# Patient Record
Sex: Female | Born: 1935 | Race: White | Hispanic: No | Marital: Married | Smoking: Never smoker
Health system: Southern US, Community
[De-identification: ages and names within clinical notes are randomized; demographics above are authoritative.]

## PROBLEM LIST (undated history)

## (undated) DIAGNOSIS — T7840XA Allergy, unspecified, initial encounter: Secondary | ICD-10-CM

## (undated) DIAGNOSIS — B029 Zoster without complications: Secondary | ICD-10-CM

## (undated) DIAGNOSIS — Z8719 Personal history of other diseases of the digestive system: Secondary | ICD-10-CM

## (undated) DIAGNOSIS — K529 Noninfective gastroenteritis and colitis, unspecified: Secondary | ICD-10-CM

## (undated) DIAGNOSIS — M545 Low back pain: Secondary | ICD-10-CM

## (undated) DIAGNOSIS — I10 Essential (primary) hypertension: Secondary | ICD-10-CM

## (undated) DIAGNOSIS — M25551 Pain in right hip: Secondary | ICD-10-CM

## (undated) DIAGNOSIS — R252 Cramp and spasm: Secondary | ICD-10-CM

## (undated) DIAGNOSIS — I2510 Atherosclerotic heart disease of native coronary artery without angina pectoris: Secondary | ICD-10-CM

## (undated) DIAGNOSIS — H5410 Blindness, one eye, low vision other eye, unspecified eyes: Secondary | ICD-10-CM

## (undated) HISTORY — DX: Personal history of other diseases of the digestive system: Z87.19

## (undated) HISTORY — DX: Allergy, unspecified, initial encounter: T78.40XA

## (undated) HISTORY — DX: Low back pain: M54.5

## (undated) HISTORY — PX: LAMINOTOMY: SHX998

## (undated) HISTORY — DX: Essential (primary) hypertension: I10

## (undated) HISTORY — DX: Cramp and spasm: R25.2

## (undated) HISTORY — PX: TONSILLECTOMY: SHX5217

## (undated) HISTORY — PX: APPENDECTOMY: SHX54

## (undated) HISTORY — PX: TUBAL LIGATION: SHX77

## (undated) HISTORY — DX: Pain in right hip: M25.551

## (undated) HISTORY — DX: Zoster without complications: B02.9

## (undated) HISTORY — DX: Atherosclerotic heart disease of native coronary artery without angina pectoris: I25.10

## (undated) HISTORY — PX: OTHER SURGICAL HISTORY: SHX169

## (undated) HISTORY — PX: CATARACT EXTRACTION: SUR2

## (undated) HISTORY — PX: EYE SURGERY: SHX253

## (undated) HISTORY — DX: Noninfective gastroenteritis and colitis, unspecified: K52.9

---

## 2000-11-08 ENCOUNTER — Emergency Department (HOSPITAL_COMMUNITY): Admission: EM | Admit: 2000-11-08 | Discharge: 2000-11-08 | Payer: Self-pay | Admitting: Emergency Medicine

## 2005-12-22 ENCOUNTER — Encounter: Payer: Self-pay | Admitting: Internal Medicine

## 2005-12-30 ENCOUNTER — Encounter: Payer: Self-pay | Admitting: Internal Medicine

## 2007-07-06 ENCOUNTER — Inpatient Hospital Stay (HOSPITAL_COMMUNITY): Admission: RE | Admit: 2007-07-06 | Discharge: 2007-07-07 | Payer: Self-pay | Admitting: Neurosurgery

## 2007-10-13 ENCOUNTER — Encounter: Admission: RE | Admit: 2007-10-13 | Discharge: 2007-10-13 | Payer: Self-pay | Admitting: Neurosurgery

## 2008-08-18 HISTORY — PX: CORONARY ANGIOPLASTY: SHX604

## 2009-08-24 ENCOUNTER — Encounter: Payer: Self-pay | Admitting: Internal Medicine

## 2009-08-24 ENCOUNTER — Ambulatory Visit: Payer: Self-pay | Admitting: Diagnostic Radiology

## 2009-08-24 ENCOUNTER — Emergency Department (HOSPITAL_BASED_OUTPATIENT_CLINIC_OR_DEPARTMENT_OTHER): Admission: EM | Admit: 2009-08-24 | Discharge: 2009-08-24 | Payer: Self-pay | Admitting: Emergency Medicine

## 2009-08-27 ENCOUNTER — Encounter: Payer: Self-pay | Admitting: Internal Medicine

## 2009-09-04 ENCOUNTER — Encounter: Payer: Self-pay | Admitting: Internal Medicine

## 2009-09-20 ENCOUNTER — Ambulatory Visit: Payer: Self-pay | Admitting: Internal Medicine

## 2009-09-20 DIAGNOSIS — I10 Essential (primary) hypertension: Secondary | ICD-10-CM

## 2009-09-20 DIAGNOSIS — R112 Nausea with vomiting, unspecified: Secondary | ICD-10-CM

## 2009-09-20 DIAGNOSIS — E1169 Type 2 diabetes mellitus with other specified complication: Secondary | ICD-10-CM | POA: Insufficient documentation

## 2009-09-20 DIAGNOSIS — R319 Hematuria, unspecified: Secondary | ICD-10-CM

## 2009-09-20 DIAGNOSIS — J309 Allergic rhinitis, unspecified: Secondary | ICD-10-CM | POA: Insufficient documentation

## 2009-09-20 DIAGNOSIS — I252 Old myocardial infarction: Secondary | ICD-10-CM

## 2009-09-20 DIAGNOSIS — E119 Type 2 diabetes mellitus without complications: Secondary | ICD-10-CM

## 2009-09-20 DIAGNOSIS — E669 Obesity, unspecified: Secondary | ICD-10-CM | POA: Insufficient documentation

## 2009-09-20 LAB — CONVERTED CEMR LAB
ALT: 38 units/L — ABNORMAL HIGH (ref 0–35)
AST: 30 units/L (ref 0–37)
Albumin: 3.9 g/dL (ref 3.5–5.2)
Alkaline Phosphatase: 68 units/L (ref 39–117)
BUN: 16 mg/dL (ref 6–23)
Bilirubin Urine: NEGATIVE
Bilirubin, Direct: 0.1 mg/dL (ref 0.0–0.3)
CO2: 26 meq/L (ref 19–32)
Calcium: 8.4 mg/dL (ref 8.4–10.5)
Casts: NONE SEEN /lpf
Chloride: 107 meq/L (ref 96–112)
Creatinine, Ser: 0.92 mg/dL (ref 0.40–1.20)
Creatinine, Urine: 54.8 mg/dL
Crystals: NONE SEEN
Glucose, Bld: 114 mg/dL — ABNORMAL HIGH (ref 70–99)
Hgb A1c MFr Bld: 8.3 % — ABNORMAL HIGH (ref 4.6–6.1)
Indirect Bilirubin: 0.4 mg/dL (ref 0.0–0.9)
Ketones, ur: NEGATIVE mg/dL
Leukocytes, UA: NEGATIVE
Microalb Creat Ratio: 125.4 mg/g — ABNORMAL HIGH (ref 0.0–30.0)
Microalb, Ur: 6.87 mg/dL — ABNORMAL HIGH (ref 0.00–1.89)
Nitrite: POSITIVE — AB
Potassium: 4.4 meq/L (ref 3.5–5.3)
Protein, ur: NEGATIVE mg/dL
RBC / HPF: NONE SEEN (ref <3)
Sodium: 145 meq/L (ref 135–145)
Specific Gravity, Urine: 1.013 (ref 1.005–1.030)
Squamous Epithelial / HPF: NONE SEEN /lpf
Total Bilirubin: 0.5 mg/dL (ref 0.3–1.2)
Total Protein: 6.4 g/dL (ref 6.0–8.3)
Urine Glucose: NEGATIVE mg/dL
Urobilinogen, UA: 0.2 (ref 0.0–1.0)
WBC, UA: NONE SEEN cells/hpf (ref <3)
pH: 5.5 (ref 5.0–8.0)

## 2009-09-21 ENCOUNTER — Telehealth: Payer: Self-pay | Admitting: Internal Medicine

## 2009-09-21 ENCOUNTER — Ambulatory Visit: Payer: Self-pay | Admitting: Diagnostic Radiology

## 2009-09-21 ENCOUNTER — Ambulatory Visit (HOSPITAL_BASED_OUTPATIENT_CLINIC_OR_DEPARTMENT_OTHER)
Admission: RE | Admit: 2009-09-21 | Discharge: 2009-09-21 | Payer: Self-pay | Source: Home / Self Care | Admitting: Internal Medicine

## 2009-09-24 DIAGNOSIS — I2510 Atherosclerotic heart disease of native coronary artery without angina pectoris: Secondary | ICD-10-CM

## 2009-09-24 DIAGNOSIS — I25118 Atherosclerotic heart disease of native coronary artery with other forms of angina pectoris: Secondary | ICD-10-CM | POA: Insufficient documentation

## 2009-09-25 ENCOUNTER — Encounter: Payer: Self-pay | Admitting: Internal Medicine

## 2009-09-27 ENCOUNTER — Telehealth: Payer: Self-pay | Admitting: Internal Medicine

## 2009-09-28 ENCOUNTER — Telehealth: Payer: Self-pay | Admitting: Internal Medicine

## 2009-10-10 ENCOUNTER — Encounter: Payer: Self-pay | Admitting: Internal Medicine

## 2009-10-10 ENCOUNTER — Encounter: Admission: RE | Admit: 2009-10-10 | Discharge: 2010-01-08 | Payer: Self-pay | Admitting: Internal Medicine

## 2009-10-18 ENCOUNTER — Ambulatory Visit: Payer: Self-pay | Admitting: Internal Medicine

## 2009-11-21 ENCOUNTER — Encounter: Payer: Self-pay | Admitting: Internal Medicine

## 2009-12-13 ENCOUNTER — Ambulatory Visit: Payer: Self-pay | Admitting: Internal Medicine

## 2009-12-13 LAB — CONVERTED CEMR LAB
BUN: 16 mg/dL (ref 6–23)
CO2: 27 meq/L (ref 19–32)
Calcium: 8.8 mg/dL (ref 8.4–10.5)
Chloride: 104 meq/L (ref 96–112)
Creatinine, Ser: 1.15 mg/dL (ref 0.40–1.20)
Glucose, Bld: 150 mg/dL — ABNORMAL HIGH (ref 70–99)
Hgb A1c MFr Bld: 6.4 % — ABNORMAL HIGH (ref <5.7)
Potassium: 5.1 meq/L (ref 3.5–5.3)
Sodium: 139 meq/L (ref 135–145)

## 2009-12-17 ENCOUNTER — Emergency Department (HOSPITAL_BASED_OUTPATIENT_CLINIC_OR_DEPARTMENT_OTHER): Admission: EM | Admit: 2009-12-17 | Discharge: 2009-12-17 | Payer: Self-pay | Admitting: Emergency Medicine

## 2009-12-17 ENCOUNTER — Ambulatory Visit: Payer: Self-pay | Admitting: Internal Medicine

## 2009-12-17 DIAGNOSIS — N390 Urinary tract infection, site not specified: Secondary | ICD-10-CM | POA: Insufficient documentation

## 2009-12-17 LAB — CONVERTED CEMR LAB
Bilirubin Urine: NEGATIVE
Glucose, Urine, Semiquant: NEGATIVE
Ketones, urine, test strip: NEGATIVE
Nitrite: POSITIVE
Protein, U semiquant: 30
Specific Gravity, Urine: 1.02
Urobilinogen, UA: 0.2
WBC Urine, dipstick: NEGATIVE
pH: 5

## 2009-12-18 ENCOUNTER — Telehealth: Payer: Self-pay | Admitting: Internal Medicine

## 2009-12-28 ENCOUNTER — Ambulatory Visit: Payer: Self-pay | Admitting: Internal Medicine

## 2010-02-19 ENCOUNTER — Encounter: Payer: Self-pay | Admitting: Internal Medicine

## 2010-02-20 ENCOUNTER — Encounter: Payer: Self-pay | Admitting: Internal Medicine

## 2010-02-20 LAB — CONVERTED CEMR LAB
BUN: 25 mg/dL — ABNORMAL HIGH (ref 6–23)
CO2: 26 meq/L (ref 19–32)
Calcium: 9.1 mg/dL (ref 8.4–10.5)
Chloride: 106 meq/L (ref 96–112)
Creatinine, Ser: 1.03 mg/dL (ref 0.40–1.20)
Creatinine, Urine: 118.5 mg/dL
Glucose, Bld: 148 mg/dL — ABNORMAL HIGH (ref 70–99)
Hgb A1c MFr Bld: 7.3 % — ABNORMAL HIGH (ref <5.7)
Microalb Creat Ratio: 4.6 mg/g (ref 0.0–30.0)
Microalb, Ur: 0.55 mg/dL (ref 0.00–1.89)
Potassium: 5.1 meq/L (ref 3.5–5.3)
Sodium: 142 meq/L (ref 135–145)

## 2010-02-21 LAB — HM COLONOSCOPY

## 2010-02-28 ENCOUNTER — Ambulatory Visit: Payer: Self-pay | Admitting: Internal Medicine

## 2010-03-21 ENCOUNTER — Encounter: Payer: Self-pay | Admitting: Internal Medicine

## 2010-04-15 ENCOUNTER — Ambulatory Visit: Payer: Self-pay | Admitting: Internal Medicine

## 2010-04-15 DIAGNOSIS — M546 Pain in thoracic spine: Secondary | ICD-10-CM

## 2010-05-16 LAB — CONVERTED CEMR LAB
BUN: 22 mg/dL (ref 6–23)
CO2: 26 meq/L (ref 19–32)
Calcium: 8.8 mg/dL (ref 8.4–10.5)
Chloride: 107 meq/L (ref 96–112)
Cholesterol: 114 mg/dL (ref 0–200)
Creatinine, Ser: 0.99 mg/dL (ref 0.40–1.20)
Glucose, Bld: 163 mg/dL — ABNORMAL HIGH (ref 70–99)
HDL: 44 mg/dL (ref >39)
Hgb A1c MFr Bld: 8.3 % — ABNORMAL HIGH (ref <5.7)
LDL Cholesterol: 46 mg/dL (ref 0–99)
Potassium: 5 meq/L (ref 3.5–5.3)
Sodium: 142 meq/L (ref 135–145)
TSH: 1.969 microintl units/mL (ref 0.350–4.500)
Total CHOL/HDL Ratio: 2.6
Triglycerides: 118 mg/dL (ref <150)
VLDL: 24 mg/dL (ref 0–40)

## 2010-05-23 ENCOUNTER — Ambulatory Visit: Payer: Self-pay | Admitting: Family Medicine

## 2010-05-23 ENCOUNTER — Ambulatory Visit (HOSPITAL_BASED_OUTPATIENT_CLINIC_OR_DEPARTMENT_OTHER): Admission: RE | Admit: 2010-05-23 | Discharge: 2010-05-23 | Payer: Self-pay | Admitting: Family Medicine

## 2010-05-23 ENCOUNTER — Ambulatory Visit: Payer: Self-pay | Admitting: Internal Medicine

## 2010-05-23 ENCOUNTER — Ambulatory Visit: Payer: Self-pay | Admitting: Diagnostic Radiology

## 2010-05-23 DIAGNOSIS — M25519 Pain in unspecified shoulder: Secondary | ICD-10-CM

## 2010-05-23 DIAGNOSIS — L259 Unspecified contact dermatitis, unspecified cause: Secondary | ICD-10-CM | POA: Insufficient documentation

## 2010-05-23 LAB — HM DIABETES FOOT EXAM

## 2010-05-26 ENCOUNTER — Encounter: Admission: RE | Admit: 2010-05-26 | Discharge: 2010-05-26 | Payer: Self-pay | Admitting: Neurosurgery

## 2010-06-19 ENCOUNTER — Ambulatory Visit: Payer: Self-pay | Admitting: Internal Medicine

## 2010-06-19 DIAGNOSIS — L03039 Cellulitis of unspecified toe: Secondary | ICD-10-CM

## 2010-06-19 DIAGNOSIS — M545 Low back pain: Secondary | ICD-10-CM

## 2010-07-01 ENCOUNTER — Encounter
Admission: RE | Admit: 2010-07-01 | Discharge: 2010-08-15 | Payer: Self-pay | Source: Home / Self Care | Attending: Internal Medicine | Admitting: Internal Medicine

## 2010-07-02 ENCOUNTER — Encounter: Payer: Self-pay | Admitting: Internal Medicine

## 2010-07-04 ENCOUNTER — Encounter
Admission: RE | Admit: 2010-07-04 | Discharge: 2010-08-15 | Payer: Self-pay | Source: Home / Self Care | Attending: Family Medicine | Admitting: Family Medicine

## 2010-08-01 ENCOUNTER — Encounter: Payer: Self-pay | Admitting: Internal Medicine

## 2010-08-01 LAB — CONVERTED CEMR LAB
BUN: 20 mg/dL (ref 6–23)
CO2: 24 meq/L (ref 19–32)
Calcium: 9.3 mg/dL (ref 8.4–10.5)
Chloride: 107 meq/L (ref 96–112)
Creatinine, Ser: 1.12 mg/dL (ref 0.40–1.20)
Glucose, Bld: 177 mg/dL — ABNORMAL HIGH (ref 70–99)
Hgb A1c MFr Bld: 8.1 % — ABNORMAL HIGH (ref <5.7)
Potassium: 5.1 meq/L (ref 3.5–5.3)
Sodium: 141 meq/L (ref 135–145)

## 2010-08-08 ENCOUNTER — Ambulatory Visit: Payer: Self-pay | Admitting: Internal Medicine

## 2010-08-15 ENCOUNTER — Encounter: Payer: Self-pay | Admitting: Internal Medicine

## 2010-08-20 ENCOUNTER — Encounter
Admission: RE | Admit: 2010-08-20 | Discharge: 2010-09-14 | Payer: Self-pay | Source: Home / Self Care | Attending: Family Medicine | Admitting: Family Medicine

## 2010-08-22 ENCOUNTER — Encounter: Payer: Self-pay | Admitting: Internal Medicine

## 2010-08-27 ENCOUNTER — Encounter: Admit: 2010-08-27 | Payer: Self-pay | Admitting: Family Medicine

## 2010-08-29 ENCOUNTER — Ambulatory Visit
Admission: RE | Admit: 2010-08-29 | Discharge: 2010-08-29 | Payer: Self-pay | Source: Home / Self Care | Attending: Family Medicine | Admitting: Family Medicine

## 2010-08-31 ENCOUNTER — Ambulatory Visit (HOSPITAL_BASED_OUTPATIENT_CLINIC_OR_DEPARTMENT_OTHER)
Admission: RE | Admit: 2010-08-31 | Discharge: 2010-08-31 | Payer: Self-pay | Source: Home / Self Care | Attending: Family Medicine | Admitting: Family Medicine

## 2010-09-02 ENCOUNTER — Encounter: Payer: Self-pay | Admitting: Family Medicine

## 2010-09-17 NOTE — Cardiovascular Report (Signed)
Summary: [facility]   Imported By: Lanelle Bal 05/30/2010 12:34:04  _____________________________________________________________________  External Attachment:    Type:   Image     Comment:   External Document

## 2010-09-17 NOTE — Assessment & Plan Note (Signed)
Summary: 3 month follow up/mhf   Vital Signs:  Patient profile:   76 year old female Height:      61 inches Weight:      222.50 pounds BMI:     42.19 O2 Sat:      98 % on Room air Temp:     97.9 degrees F oral Pulse rate:   70 / minute Pulse rhythm:   regular Resp:     20 per minute BP sitting:   134 / 70  (right arm) Cuff size:   large  Vitals Entered By: Glendell Docker CMA (May 23, 2010 8:23 AM)  O2 Flow:  Room air CC: 3 month follow up Is Patient Diabetic? Yes Did you bring your meter with you today? No Pain Assessment Patient in pain? yes     Location: shoulder Intensity: 8 Type: aching Onset of pain  Intermittent Comments blood sugar 135 this am  and high has been 176, unresolved pain in right shoulder with rom since last office visit, appointment with neurosurgeon today for back and leg problems   Primary Care Jossiah Smoak:  DThomos Lemons DO  CC:  3 month follow up.  History of Present Illness: 76 y/o white female for f/u pt still having right upper back pain right shoulder also seems to be hurting shoulder symptoms worse with taking out the garbage also pain with laying on right shoulder   DM II -  blood sugars higher since stopping glipizide but no low blood sugars.  she is taking janumet regularly  she does not count carbs  Current Diet: Breakfast: wheat toast with peanut butter Lunch: salad with ranch DInner:  varies Snacks: infreq Beverage: no sugary beverages   Preventive Screening-Counseling & Management  Alcohol-Tobacco     Smoking Status: never  Allergies: 1)  ! 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 she reports hx of bulging disc.  she has appt with Dr. Jordan Likes  she is having eczema flare,  worse in forearms,  chronic pruritus  Physical Exam  General:  alert, well-developed, and well-nourished.   Head:  normocephalic and atraumatic.   Lungs:  normal respiratory effort and normal breath sounds.   Heart:  normal rate, regular rhythm, and no gallop.   Msk:  tenderness at right ac joint.  pain with shoulder abduction Extremities:  trace left pedal edema and trace right pedal edema.   Skin:  scaly rash on bilateral elbows (flexor surface) Psych:  normally interactive, good eye contact, not anxious appearing, and not depressed appearing.    Diabetes Management Exam:    Foot Exam (with socks and/or shoes not present):  Inspection:          Left foot: normal          Right foot: normal   Impression & Recommendations:  Problem # 1:  DIABETES MELLITUS, TYPE II (ICD-250.00) Assessment Deteriorated add nateglinide before meals.  take 1/2 dose if low carb meal Pt counseled on diet and exercise.  Carb counting  handout provided   Her updated medication list for this problem includes:    Janumet 50-500 Mg Tabs (Sitagliptin-metformin hcl) ..... One by mouth two times a day    Lisinopril 20 Mg Tabs (Lisinopril) .Marland Kitchen... Take 1 tablet by mouth once a day    Aspirin 81 Mg Tabs (Aspirin) .Marland Kitchen... Take 1 tablet by mouth once a day    Nateglinide 120 Mg Tabs (Nateglinide) .Marland Kitchen... 1/2 to one tab by mouth three times a day as directed  Labs Reviewed: Creat: 0.99 (05/16/2010)     Last Eye Exam: diabetic retinopathy  (05/02/2009) Reviewed HgBA1c results: 8.3 (05/16/2010)  7.3 (02/20/2010)  Problem # 2:  ECZEMA (ICD-692.9) Assessment: Deteriorated flare on forearms.  use ointment as directed Her updated medication list for this problem includes:    Triamcinolone Acetonide 0.5 % Oint (Triamcinolone acetonide) .Marland Kitchen... Apply two times a day to affected areas  Problem # 3:  SHOULDER PAIN, RIGHT (ICD-719.41)  right shoulder pain suggestive of rotator tendinitis.   refer to ortho for possible steroid injection  The following medications were removed from the medication list:    Cyclobenzaprine Hcl 5 Mg Tabs (Cyclobenzaprine hcl) ..... One by mouth at bedtime as needed for upper back pain Her updated medication list for this problem includes:    Aspirin 81 Mg Tabs (Aspirin) .Marland Kitchen... Take 1 tablet by mouth once a day  Orders: Orthopedic Referral (Ortho)  Problem # 4:  HYPERTENSION (ICD-401.9) Assessment: Improved  Her updated medication list for this problem includes:    Metoprolol Tartrate 50 Mg Tabs (Metoprolol tartrate) .Marland Kitchen... Take 1/2  tablet by mouth two times a day    Lisinopril 20 Mg Tabs (Lisinopril) .Marland Kitchen... Take 1 tablet by mouth once a day    Amlodipine Besylate 10 Mg Tabs (Amlodipine besylate) ..... One by mouth once daily  BP today: 134/70 Prior BP: 160/80 (04/15/2010)  Labs Reviewed: K+: 5.0 (05/16/2010) Creat: : 0.99 (05/16/2010)   Chol: 114 (05/16/2010)   HDL: 44 (05/16/2010)   LDL: 46 (05/16/2010)   TG: 118 (05/16/2010)  Complete Medication List: 1)  Metoprolol Tartrate 50 Mg Tabs (Metoprolol tartrate) .... Take 1/2  tablet by mouth two times a day 2)  Janumet 50-500 Mg Tabs (Sitagliptin-metformin hcl) .... One by mouth two times a day 3)  Lipitor 40 Mg Tabs (Atorvastatin calcium) .... Take 1 tablet by mouth once a day 4)  Plavix 75 Mg Tabs (Clopidogrel bisulfate) .... Take 1 tablet by mouth every morning 5)  Lisinopril 20 Mg Tabs (Lisinopril) .... Take 1 tablet by mouth once a day 6)   Nitrostat 0.4 Mg Subl (Nitroglycerin) .... One tablet under tongue as needed as directed 7)  Ranitidine Hcl 150 Mg Tabs (Ranitidine hcl) .... One by mouth two times a day as needed for heartburn 8)  Aspirin 81 Mg Tabs (Aspirin) .... Take 1 tablet by mouth once a day 9)  Amlodipine Besylate 10 Mg Tabs (Amlodipine besylate) .... One by mouth once daily 10)  Nateglinide 120 Mg Tabs (Nateglinide) .... 1/2 to one tab by mouth three times a day as directed 11)  Triamcinolone Acetonide 0.5 % Oint (  Triamcinolone acetonide) .... Apply two times a day to affected areas  Patient Instructions: 1)  Please schedule a follow-up appointment in 2 months. 2)  BMP prior to visit, ICD-9:  401.9 3)  HbgA1C prior to visit, ICD-9: 250.00 4)  Please return for lab work one (1) week before your next appointment.  Prescriptions: TRIAMCINOLONE ACETONIDE 0.5 % OINT (TRIAMCINOLONE ACETONIDE) apply two times a day to affected areas  #30 grams x 2   Entered and Authorized by:   D. Thomos Lemons DO   Signed by:   D. Thomos Lemons DO on 05/23/2010   Method used:   Electronically to        [id number] (retail)       [address] [place], [place]  [address]       Ph: [phone]       Fax: [phone]   RxID:   [id number] NATEGLINIDE 120 MG TABS (NATEGLINIDE) 1/2 to one tab by mouth three times a day as directed  #90 x 3   Entered and Authorized by:   D. Thomos Lemons DO   Signed by:   D. Thomos Lemons DO on 05/23/2010   Method used:   Electronically to        [id number] (retail)       [address] [place], [place]  [address]       Ph: [phone]       Fax: [phone]   RxID:   [id number]     Current Allergies (reviewed today): ! SULFA CYCLOBENZAPRINE HCL (CYCLOBENZAPRINE HCL)

## 2010-09-17 NOTE — Letter (Signed)
Summary: [facility]   Imported By: Lanelle Bal 05/30/2010 12:36:32  _____________________________________________________________________  External Attachment:    Type:   Image     Comment:   External Document

## 2010-09-17 NOTE — Assessment & Plan Note (Signed)
Summary: 1 week fu/dt   Vital Signs:  Patient profile:   76 year old female Height:      61 inches Weight:      216.50 pounds BMI:     41.06 O2 Sat:      100 % on Room air Temp:     97.5 degrees F oral Pulse rate:   60 / minute Pulse rhythm:   regular Resp:     18 per minute BP sitting:   168 / 74  (right arm) Cuff size:   large  Vitals Entered By: Glendell Docker CMA (Dec 28, 2009 8:34 AM)  O2 Flow:  Room air CC: 1 Week Follow up Is Patient Diabetic? Yes  Does patient need assistance? Ambulation Normal   Primary Care Provider:  Dondra Spry DO  CC:  1 Week Follow up.  History of Present Illness: 76 year old white female previously seen for possible urosepsis for your followup. Patient evaluated in ER and treated with IV Rocephin. She completed course of Ceftin at home Urine culture showed Escherichia coli - pansensitive. She is feeling much better. She restarted her blood pressure medications as well as her diabetes medication.  type 2 diabetes-occasionally experiences low blood sugar.  Her blood sugar this morning was approximately 80 Hypertension home blood pressure readings still elevated (greater than 140) She usually takes her blood pressure medication at 10 AM. She took her last dose yesterday.   Allergies: 1)  ! Sulfa  Past History:  Past Medical History: Allergic rhinitis Diabetes mellitus, type II uncontrolled  Hypertension  Myocardial infarction, hx of  hx of low  back pain  diabetic retinopathy - Dr Perley Jain at [facility]  (vitrectomy left eye) hx of tubal pregnancy Coronary artery disease    Past Surgical History: Cataract extraction Appendectomy  Tonsillectomy S/P right L3-4 laminotomy and microdiskectomy - Dr Jordan Likes   no colon cancer screening  no recent mammogram  Physical Exam  General:  alert and overweight-appearing.   Neck:  No deformities, masses, or tenderness noted.no carotid bruits.   Lungs:  normal respiratory effort and normal  breath sounds.   Heart:  normal rate, regular rhythm, no murmur, and no gallop.   Extremities:  trace left pedal edema and trace right pedal edema.   Neurologic:  cranial nerves II-XII intact and gait normal.     Impression & Recommendations:  Problem # 1:  UTI (ICD-599.0) Assessment Improved Pt probably had urosepsis.  urine cx showed E. Coli.  she received rocephin in ER and ceftin at home. she is feeling much better  Problem # 2:  HYPERTENSION (ICD-401.9) she restarted her usual meds.  BP still elevated after higher dose of lisinopril.  add amlodipine.  If low bp, pt advised to cut amlodipine in half  Her updated medication list for this problem includes:    Metoprolol Tartrate 50 Mg Tabs (Metoprolol tartrate) .Marland Kitchen... Take 1 tablet by mouth two times a day    Lisinopril 20 Mg Tabs (Lisinopril) .Marland Kitchen... Take 1 tablet by mouth once a day    Amlodipine Besylate 5 Mg Tabs (Amlodipine besylate) ..... One by mouth once daily  BP today: 168/74 Prior BP: 152/88 (12/17/2009)  Labs Reviewed: K+: 5.1 (12/13/2009) Creat: : 1.15 (12/13/2009)     Problem # 3:  DIABETES MELLITUS, TYPE II (ICD-250.00) Pt getting occasional CBG in 80's.  DC glipizide due to risk of hypoglycemia  The following medications were removed from the medication list:    Glipizide 2.5 Mg Xr24h-tab (Glipizide) .Marland KitchenMarland KitchenMarland KitchenMarland Kitchen  Take 1 tablet by mouth once a day Her updated medication list for this problem includes:    Janumet 50-500 Mg Tabs (Sitagliptin-metformin hcl) ..... One by mouth two times a day    Lisinopril 20 Mg Tabs (Lisinopril) .Marland Kitchen... Take 1 tablet by mouth once a day    Aspirin 81 Mg Tabs (Aspirin) .Marland Kitchen... Take 1 tablet by mouth once a day  Labs Reviewed: Creat: 1.15 (12/13/2009)     Last Eye Exam: diabetic retinopathy (05/02/2009) Reviewed HgBA1c results: 6.4 (12/13/2009)  8.3 (09/20/2009)  Complete Medication List: 1)  Metoprolol Tartrate 50 Mg Tabs (Metoprolol tartrate) .... Take 1 tablet by mouth two times a  day 2)  Janumet 50-500 Mg Tabs (Sitagliptin-metformin hcl) .... One by mouth two times a day 3)  Lipitor 40 Mg Tabs (Atorvastatin calcium) .... Take 1 tablet by mouth once a day 4)  Plavix 75 Mg Tabs (Clopidogrel bisulfate) .... Take 1 tablet by mouth every morning 5)  Lisinopril 20 Mg Tabs (Lisinopril) .... Take 1 tablet by mouth once a day 6)  Nitrostat 0.4 Mg Subl (Nitroglycerin) .... One tablet under tongue as needed as directed 7)  Ranitidine Hcl 150 Mg Tabs (Ranitidine hcl) .... One by mouth two times a day as needed for heartburn 8)  Aspirin 81 Mg Tabs (Aspirin) .... Take 1 tablet by mouth once a day 9)  Amlodipine Besylate 5 Mg Tabs (Amlodipine besylate) .... One by mouth once daily  Patient Instructions: 1)  Stop glipizide 2)  Please schedule a follow-up appointment in 2 months. 3)  BMP prior to visit, ICD-9: 401.9 4)  HbgA1C prior to visit, ICD-9: 250.00 5)  Urine micro alb/Cr ratio:  250.00 6)  Please return for lab work one (1) week before your next appointment.  Prescriptions: AMLODIPINE BESYLATE 5 MG TABS (AMLODIPINE BESYLATE) one by mouth once daily  #30 x 3   Entered and Authorized by:   D. Thomos Lemons DO   Signed by:   D. Thomos Lemons DO on 12/28/2009   Method used:   Electronically to        [id number] (retail)       [address] [place], [place]  [address]       Ph: [phone]       Fax: [phone]   RxID:   [id number]   Current Allergies (reviewed today): ! SULFA

## 2010-09-17 NOTE — Assessment & Plan Note (Signed)
Summary: THROWING UP, DIARRHEA, FEVER/DT   Vital Signs:  Patient profile:   76 year old female Height:      61 inches Weight:      216.50 pounds BMI:     41.06 Temp:     98.4 degrees F oral Pulse rate:   96 / minute Pulse rhythm:   regular Resp:     16 per minute BP sitting:   152 / 88  (right arm) Cuff size:   large  Vitals Entered By: Mervin Kung CMA (Dec 17, 2009 2:13 PM) CC: room 2  N/V last Wednesday and intermittent fever since then. Is Patient Diabetic? Yes   Primary Care Provider:  Dondra Spry DO  CC:  room 2  N/V last Wednesday and intermittent fever since then..  History of Present Illness: 76 y/o white female c/o NV, diarrhea, and weakness her symptoms started early wed AM - bad chills, vomiting, then diarrhea felt chills on Sat.  temp of 102 sat night - profuse sweating no abd pain no urinary symptoms  no resp symptoms abd to keep down liquids and small amt of food but feels weak mild dizziness  Allergies: 1)  ! Sulfa  Past History:  Past Medical History: Allergic rhinitis Diabetes mellitus, type II uncontrolled Hypertension  Myocardial infarction, hx of  hx of low  back pain  diabetic retinopathy - Dr Perley Jain at [facility]  (vitrectomy left eye) hx of tubal pregnancy Coronary artery disease    Past Surgical History: Cataract extraction Appendectomy  Tonsillectomy S/P right L3-4 laminotomy and microdiskectomy - Dr Jordan Likes   no colon cancer screening no recent mammogram  Family History: Family History of CAD Female 1st degree relative <60 father with hx of CVA mother had intestinal  no cancer      Social History: 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  The patient denies syncope and abdominal pain.         weakness, poor p.o. intake  Physical Exam  General:   alert and overweight-appearing.  pale, ill appearing Head:  normocephalic and atraumatic.   Ears:  R ear normal and L ear normal.   Mouth:  pharynx pink and moist.   Neck:  No deformities, masses, or tenderness noted. Lungs:  normal respiratory effort, normal breath sounds, no crackles, and no wheezes.   Heart:  normal rate, regular rhythm, no murmur, and no gallop.   Abdomen:  soft, mild upper abd tendernessno rebound tenderness.   Extremities:  No lower extremity edema  Skin:  skin upper abd warm to touch   Impression & Recommendations:  Problem # 1:  NAUSEA AND VOMITING (ICD-787.01) 76 y/o white female with NV and diarrhea on Wednesday.  no abd pain.  I suspect symptoms from UTI.  I am concerned about possibility of urospesis.   I suggest further ER eval CBCD, LFTs ,  Lipase, IV fluids,  blood cx  Problem # 2:  UTI (ICD-599.0)  Orders: UA Dipstick w/o Micro (manual) ([address]) T-Culture, Urine ([id number]) Specimen Handling ([address])  Problem # 3:  HYPERTENSION (ICD-401.9) take 1/2 dose of lisinopril until acute illness resolved Her updated medication list for this problem includes:    Metoprolol Tartrate 50 Mg Tabs (Metoprolol tartrate) .Marland Kitchen... Take 1 tablet by mouth two  times a day    Lisinopril 20 Mg Tabs (Lisinopril) .Marland Kitchen... Take 1 tablet by mouth once a day  BP today: 152/88 Prior BP: 130/80 (10/18/2009)  Labs Reviewed: K+: 5.1 (12/13/2009) Creat: : 1.15 (12/13/2009)     Problem # 4:  DIABETES MELLITUS, TYPE II (ICD-250.00) Hold glipizide and janumet until acute GI illness resolved  Her updated medication list for this problem includes:    Janumet 50-500 Mg Tabs (Sitagliptin-metformin hcl) ..... One by mouth two times a day    Glipizide 2.5 Mg Xr24h-tab (Glipizide) .Marland Kitchen... Take 1 tablet by mouth once a day    Lisinopril 20 Mg Tabs (Lisinopril) .Marland Kitchen... Take 1 tablet by mouth once a day    Aspirin 81 Mg Tabs (Aspirin) .Marland Kitchen... Take 1 tablet by mouth once a day  Complete  Medication List: 1)  Metoprolol Tartrate 50 Mg Tabs (Metoprolol tartrate) .... Take 1 tablet by mouth two times a day 2)  Janumet 50-500 Mg Tabs (Sitagliptin-metformin hcl) .... One by mouth two times a day 3)  Lipitor 40 Mg Tabs (Atorvastatin calcium) .... Take 1 tablet by mouth once a day 4)  Plavix 75 Mg Tabs (Clopidogrel bisulfate) .... Take 1 tablet by mouth every morning 5)  Glipizide 2.5 Mg Xr24h-tab (Glipizide) .... Take 1 tablet by mouth once a day 6)  Lisinopril 20 Mg Tabs (Lisinopril) .... Take 1 tablet by mouth once a day 7)  Nitrostat 0.4 Mg Subl (Nitroglycerin) .... One tablet under tongue as needed as directed 8)  Ranitidine Hcl 150 Mg Tabs (Ranitidine hcl) .... One by mouth two times a day as needed for heartburn 9)  Aspirin 81 Mg Tabs (Aspirin) .... Take 1 tablet by mouth once a day  Patient Instructions: 1)  Hold Janumet 2)  Take 1/2 of lisinopril 3)  Hold glipizide 4)  Increase fluid intake 5)  I suggest ER evaluation.    Current Allergies (reviewed today): ! SULFA  Laboratory Results   Urine Tests    Routine Urinalysis   Color: yellow Appearance: Cloudy Glucose: negative   (Normal Range: Negative) Bilirubin: negative   (Normal Range: Negative) Ketone: negative   (Normal Range: Negative) Spec. Gravity: 1.020   (Normal Range: 1.003-1.035) Blood: moderate   (Normal Range: Negative) pH: 5.0   (Normal Range: 5.0-8.0) Protein: 30   (Normal Range: Negative) Urobilinogen: 0.2   (Normal Range: 0-1) Nitrite: positive   (Normal Range: Negative) Leukocyte Esterace: negative   (Normal Range: Negative)

## 2010-09-17 NOTE — Progress Notes (Signed)
Summary: RECORDS REC FROM [facility]   Phone Note Other Incoming   Caller: [facility]  Summary of Call: RECORDS REC FROM [facility] CODED AND PUT ON DR Artist Pais  Initial call taken by: Roselle Locus,  September 27, 2009 10:57 AM

## 2010-09-17 NOTE — Assessment & Plan Note (Signed)
Summary: infected toe/mhf   Vital Signs:  Patient profile:   76 year old female Height:      61 inches Weight:      223.50 pounds BMI:     42.38 O2 Sat:      97 % on Room air Temp:     98.5 degrees F oral Pulse rate:   64 / minute Pulse rhythm:   regular Resp:     18 per minute BP sitting:   120 / 80  (left arm) Cuff size:   large  Vitals Entered By: Glendell Docker CMA (June 19, 2010 10:18 AM)  O2 Flow:  Room air CC: Big Toe discomfort Is Patient Diabetic? Yes Did you bring your meter with you today? No Pain Assessment Patient in pain? yes     Location: Right Big toe Intensity: 5 Type: sharp Onset of pain  With activity Comments c/o right big toe pain to touch,and when walking, ongoing for the past week   Primary Care Provider:  Dondra Spry DO  CC:  Big Toe discomfort.  History of Present Illness:   76 y/o white female with hx of DM II c/o right  toe pain area around nail is tender and slightly red no fever or chills no drainage  she also c/o intermittent low back she has hx of chronic low back pain - s/p right L3-4 laminotomy and microdiskectomy - Dr Jordan Likes chronic stiffness    Preventive Screening-Counseling & Management  Alcohol-Tobacco     Smoking Status: never  Allergies: 1)  ! Sulfa 2)  Cyclobenzaprine Hcl (Cyclobenzaprine Hcl)  Past History:  Past Medical History: Allergic rhinitis Diabetes mellitus, type II uncontrolled  Hypertension   Myocardial infarction, hx of        hx of low  back pain  diabetic retinopathy - Dr Perley Jain at [facility]  (vitrectomy left eye) hx of tubal pregnancy Coronary artery disease   hx of GI bleed on heparin   Past Surgical History: Cataract extraction Appendectomy    Tonsillectomy S/P right L3-4 laminotomy and microdiskectomy - Dr Jordan Likes   no colon cancer screening  no recent mammogram     Family History: Family History of CAD Female 1st degree relative <60 father with hx of CVA mother had  intestinal  no cancer           Social History: 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   Lungs:  normal respiratory effort and normal breath sounds.   Heart:  normal rate, regular rhythm, and no gallop.   Skin:  slight redness right great toe   Impression & Recommendations:  Problem # 1:  BACK PAIN, LUMBAR, CHRONIC (ICD-724.2)  Her updated medication list for this problem includes:    Aspirin 81 Mg Tabs (Aspirin) .Marland Kitchen... Take 1 tablet by mouth once a day  Orders: Physical Therapy Referral (PT)  Problem # 2:  PARONYCHIA, TOE (ICD-681.11) Pt with mild paronychia of right great toe.  use sitz bath and abx as directed Patient advised to call office if symptoms persist or worsen.  Her updated medication list for this problem includes:    Cephalexin 500 Mg Caps (Cephalexin) ..... One by mouth three times a day  Complete Medication List: 1)  Metoprolol Tartrate 50 Mg Tabs (Metoprolol tartrate) .Marland KitchenMarland KitchenMarland Kitchen  Take 1/2  tablet by mouth two times a day 2)  Janumet 50-500 Mg Tabs (Sitagliptin-metformin hcl) .... One by mouth two times a day 3)  Lipitor 40 Mg Tabs (Atorvastatin calcium) .... Take 1 tablet by mouth once a day 4)  Plavix 75 Mg Tabs (Clopidogrel bisulfate) .... Take 1 tablet by mouth every morning 5)  Lisinopril 20 Mg Tabs (Lisinopril) .... Take 1 tablet by mouth once a day 6)  Nitrostat 0.4 Mg Subl (Nitroglycerin) .... One tablet under tongue as needed as directed 7)  Ranitidine Hcl 150 Mg Tabs (Ranitidine hcl) .... One by mouth two times a day as needed for heartburn 8)  Aspirin 81 Mg Tabs (Aspirin) .... Take 1 tablet by mouth once a day 9)  Amlodipine Besylate 10 Mg Tabs (Amlodipine besylate) .... One by mouth once daily 10)  Nateglinide 120 Mg Tabs (Nateglinide) .... 1/2 to one tab by mouth three times a day as  directed 11)  Triamcinolone Acetonide 0.5 % Oint (Triamcinolone acetonide) .... Apply two times a day to affected areas 12)  Cephalexin 500 Mg Caps (Cephalexin) .... One by mouth three times a day  Patient Instructions: 1)  Call our office if your symptoms do not  improve or gets worse. Prescriptions: CEPHALEXIN 500 MG CAPS (CEPHALEXIN) one by mouth three times a day  #21 x 0   Entered and Authorized by:   D. Thomos Lemons DO   Signed by:   D. Thomos Lemons DO on 06/19/2010   Method used:   Electronically to        [id number] (retail)       [address] [place], [place]  [address]       Ph: [phone]       Fax: [phone]   RxID:   [id number]    Orders Added: 1)  Physical Therapy Referral [PT] 2)  Est. Patient Level III [84[address]]    Current Allergies (reviewed today): ! SULFA CYCLOBENZAPRINE HCL (CYCLOBENZAPRINE HCL)

## 2010-09-17 NOTE — Assessment & Plan Note (Signed)
Summary: RT SHOULDER PAIN/NP/LP   Vital Signs:  Patient profile:   76 year old female Height:      61 inches Weight:      222.6 pounds  Primary Care Provider:  Dondra Spry DO   History of Present Illness: VSs from Dr. Olegario Messier visit today reviewed.  Allergies (verified): 1)  ! Sulfa 2)  Cyclobenzaprine Hcl (Cyclobenzaprine Hcl)  Past History:  Past Medical History: Last updated: 05/23/2010 Allergic rhinitis Diabetes mellitus, type II uncontrolled  Hypertension   Myocardial infarction, hx of      hx of low  back pain  diabetic retinopathy - Dr Perley Jain at [facility]  (vitrectomy left eye) hx of tubal pregnancy Coronary artery disease   hx of GI bleed on heparin   Family History: Last updated: 05/23/2010 Family History of CAD Female 1st degree relative <60 father with hx of CVA mother had intestinal  no cancer          Risk Factors: Alcohol Use: 0 (09/20/2009) Caffeine Use: 1 beverage  (09/20/2009) Exercise: no (09/20/2009)  Physical Exam  General:  alert, well-developed, and well-nourished.   Msk:  R shoulder: No gross deformity, swelling, or bruising. TTP diffusely about posterior right shoulder including AC joint.  No TTP biceps tendon.  No trapezius or neck TTP. FROM with painful arc.  No frozen shoulder. Strength 4/5 with resisted empty can reproducing her pain.  5/5 strength with resisted IR/ER without pain. Positive hawkins and equivocal neers.  Negative speeds. Equivocal crossover. NVI distally.  L shoulder: FROM without pain. Strength 5/5 with empty can, resisted IR/ER.   Impression & Recommendations:  Problem # 1:  SHOULDER PAIN, RIGHT (ICD-719.41) Assessment Deteriorated X-rays reviewed and no significant AC or glenohumeral DJD.  Symptoms and exam consistent with rotator cuff impingement and subacromial bursitis.  Offered to try oral NSAID vs cortisone injection and decided to proceed with injection after discussion of risks and benefits.  Couple  with home exercise program - lives fairly far from PT and states she will be compliant with home program - will include Job's program and scap stabilization.  Icing, discussed movements to avoid.  F/u in 6 weeks for recheck or sooner if worsens.  After informed written consent patient was seated on the exam table and right shoulder was prepped with alcohol swab.  Utilizing a posterior approach right subacromial space was injected with 3:1 depomedrol:marcaine.  Patient tolerated the procedure well without any immediate complications.  Her updated medication list for this problem includes:    Aspirin 81 Mg Tabs (Aspirin) .Marland Kitchen... Take 1 tablet by mouth once a day  Orders: Diagnostic X-Ray/Fluoroscopy (Diagnostic X-Ray/Flu) Joint Aspirate / Injection, Large ([address])  Complete Medication List: 1)  Metoprolol Tartrate 50 Mg Tabs (Metoprolol tartrate) .... Take 1/2  tablet by mouth two times a day 2)  Janumet 50-500 Mg Tabs (Sitagliptin-metformin hcl) .... One by mouth two times a day 3)  Lipitor 40 Mg Tabs (Atorvastatin calcium) .... Take 1 tablet by mouth once a day 4)  Plavix 75 Mg Tabs (Clopidogrel bisulfate) .... Take 1 tablet by mouth every morning 5)  Lisinopril 20 Mg Tabs (Lisinopril) .... Take 1 tablet by mouth once a day 6)  Nitrostat 0.4 Mg Subl (Nitroglycerin) .... One tablet under tongue as needed as directed 7)  Ranitidine Hcl 150 Mg Tabs (Ranitidine hcl) .... One by mouth two times a day as needed for heartburn 8)  Aspirin 81 Mg Tabs (Aspirin) .... Take 1 tablet by mouth once  a day 9)  Amlodipine Besylate 10 Mg Tabs (Amlodipine besylate) .... One by mouth once daily 10)  Nateglinide 120 Mg Tabs (Nateglinide) .... 1/2 to one tab by mouth three times a day as directed 11)  Triamcinolone Acetonide 0.5 % Oint (Triamcinolone acetonide) .... Apply two times a day to affected areas  Patient Instructions: 1)  You have rotator cuff tendinitis and subacromial bursitis. 2)  You were given a shot  today to calm down the inflammation. 3)  This will increase your blood sugar some for the next few days to a week so check this regularly. 4)  It's ok to start taking the celebrex today also. 5)  Go to physical therapy for 1-3 visits (starting next week) then it's very important for you to do the exercises most days of the week. 6)  Ice your shoulder for 15 minutes at a time 3-4 times a day. 7)  Try to limit overhead activities and lifting with an extended arm. 8)  Follow up with me in 4-6 weeks for a recheck.

## 2010-09-17 NOTE — Miscellaneous (Signed)
Summary: Lab Orders  Clinical Lists Changes  Orders: Added new Test order of T-Basic Metabolic Panel [id number]) - Signed Added new Test order of T- Hemoglobin A1C ([id number]) - Signed Added new Test order of T-Urine Microalbumin w/creat. ratio [id number]) - Signed

## 2010-09-17 NOTE — Letter (Signed)
Summary: [facility]   Imported By: Lanelle Bal 11/29/2009 13:57:52  _____________________________________________________________________  External Attachment:    Type:   Image     Comment:   External Document

## 2010-09-17 NOTE — Assessment & Plan Note (Signed)
Summary: 1 month follow up/mhf   Vital Signs:  Patient profile:   76 year old female Weight:      216.75 pounds BMI:     41.10 O2 Sat:      99 % on Room air Temp:     97.8 degrees F oral Pulse rate:   59 / minute Pulse rhythm:   regular BP sitting:   130 / 80  (left arm) Cuff size:   large  Vitals Entered By: Glendell Docker CMA (October 18, 2009 9:15 AM)  O2 Flow:  Room air CC: RM 3- 1 Month Follow up, Type 2 diabetes mellitus follow-up Is Patient Diabetic? Yes Did you bring your meter with you today? Yes Comments 1 Month Follow up , low blood sugar 88 high 154 - this am 109   Primary Care Provider:  Dondra Spry DO  CC:  RM 3- 1 Month Follow up and Type 2 diabetes mellitus follow-up.  History of Present Illness:  Type 2 Diabetes Mellitus Follow-Up      This is a 76 year old woman who presents for Type 2 diabetes mellitus follow-up.  The patient denies self managed hypoglycemia, hypoglycemia requiring help, and weight gain.  The patient denies the following symptoms: neuropathic pain.  Since the last visit the patient reports good dietary compliance, compliance with medications, and monitoring blood glucose.    Allergies: 1)  ! Sulfa  Past History:  Past Medical History: Allergic rhinitis Diabetes mellitus, type II uncontrolled Hypertension  Myocardial infarction, hx of  hx of low  back pain  diabetic retinopathy - Dr Perley Jain at [facility]  (vitrectomy left eye) hx of tubal pregnancy Coronary artery disease   Past Surgical History: Cataract extraction Appendectomy Tonsillectomy S/P right L3-4 laminotomy and microdiskectomy - Dr Jordan Likes   no colon cancer screening no recent mammogram  Family History: Family History of CAD Female 1st degree relative <60 father with hx of CVA mother had intestinal  no cancer     Social History: 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   Neck:  No deformities, masses, or tenderness noted.no carotid bruits.   Lungs:  normal respiratory effort, normal breath sounds, and no wheezes.   Heart:  normal rate, regular rhythm, no murmur, and no gallop.   Pulses:  dorsalis pedis and posterior tibial pulses are full and equal bilaterally Extremities:  No lower extremity edema    Impression & Recommendations:  Problem # 1:  HYPERTENSION (ICD-401.9) BP with manual cuff 170/80.  she may have component of white coat htn.  increase lisinopril to 10 mg.  Her updated medication list for this problem includes:    Metoprolol Tartrate 50 Mg Tabs (Metoprolol tartrate) .Marland Kitchen... Take 1 tablet by mouth two times a day    Lisinopril 10 Mg Tabs (Lisinopril) ..... One by mouth once daily  BP today: 130/80 Prior BP: 154/90 (09/20/2009)  Labs Reviewed: K+: 4.4 (09/20/2009) Creat: : 0.92 (09/20/2009)     Problem # 2:  DIABETES MELLITUS, TYPE II (ICD-250.00) Assessment: Improved Improved compliance.  one episode of hypoglycemia.  pt advised to dc glipizide if CBG < 100.  Her updated medication list for this problem includes:    Janumet 50-500 Mg Tabs (Sitagliptin-metformin hcl) ..... One by mouth two times a day    Glipizide 2.5  Mg Xr24h-tab (Glipizide) .Marland Kitchen... Take 1 tablet by mouth once a day    Lisinopril 10 Mg Tabs (Lisinopril) ..... One by mouth once daily    Aspirin 81 Mg Tabs (Aspirin) .Marland Kitchen... Take 1 tablet by mouth once a day  Complete Medication List: 1)  Metoprolol Tartrate 50 Mg Tabs (Metoprolol tartrate) .... Take 1 tablet by mouth two times a day 2)  Janumet 50-500 Mg Tabs (Sitagliptin-metformin hcl) .... One by mouth two times a day 3)  Lipitor 40 Mg Tabs (Atorvastatin calcium) .... Take 1 tablet by mouth once a day 4)  Plavix 75 Mg Tabs (Clopidogrel bisulfate) .... Take 1 tablet by mouth every morning 5)  Glipizide 2.5 Mg Xr24h-tab (Glipizide) ....  Take 1 tablet by mouth once a day 6)  Lisinopril 10 Mg Tabs (Lisinopril) .... One by mouth once daily 7)  Nitrostat 0.4 Mg Subl (Nitroglycerin) .... One tablet under tongue as needed as directed 8)  Ranitidine Hcl 150 Mg Tabs (Ranitidine hcl) .... One by mouth two times a day as needed for heartburn 9)  Aspirin 81 Mg Tabs (Aspirin) .... Take 1 tablet by mouth once a day  Patient Instructions: 1)  Please schedule a follow-up appointment in 2 months. 2)  BMP prior to visit, ICD-9: 401.9 3)  HbgA1C prior to visit, ICD-9:  250.02 4)  Please return for lab work one (1) week before your next appointment.  5)  Monitor your blood pressure at home with automated cuff 6)  (Omron or Relion brand) 7)  Follow low salt diet (2 grams) 8)  If your blood sugars are less than 100, stop glipizide Prescriptions: LISINOPRIL 10 MG TABS (LISINOPRIL) one by mouth once daily  #90 x 3   Entered and Authorized by:   D. Thomos Lemons DO   Signed by:   D. Thomos Lemons DO on 10/18/2009   Method used:   Electronically to        [facility] (retail)       [address] [place], [place]  [address]       Ph: [phone]       Fax: [phone]   RxID:   [id number] LISINOPRIL 10 MG TABS (LISINOPRIL) one by mouth once daily  #30 x 3   Entered and Authorized by:   D. Thomos Lemons DO   Signed by:   D. Thomos Lemons DO on 10/18/2009   Method used:   Electronically to        [facility] (retail)       [address] [place], [place]  [address]       Ph: [phone]       Fax: [phone]   RxID:   [id number]   Current Allergies (reviewed today): ! SULFA   Immunization History:  Influenza Immunization History:    Influenza:  historical (09/20/2009)

## 2010-09-17 NOTE — Letter (Signed)
Summary: [facility]   Imported By: Lanelle Bal 03/05/2010 09:34:03  _____________________________________________________________________  External Attachment:    Type:   Image     Comment:   External Document

## 2010-09-17 NOTE — Assessment & Plan Note (Signed)
Summary: Pulled muscle in back/hea   Vital Signs:  Patient profile:   76 year old female Weight:      223.25 pounds BMI:     42.34 O2 Sat:      99 % on 0.25 L/min Temp:     97.7 degrees F oral Pulse rate:   61 / minute Pulse rhythm:   regular Resp:     18 per minute BP sitting:   160 / 80  (left arm) Cuff size:   large  Vitals Entered By: Glendell Docker CMA (April 15, 2010 12:13 PM)  O2 Flow:  0.25 L/min CC: back pain Is Patient Diabetic? Yes Did you bring your meter with you today? No Pain Assessment Patient in pain? yes     Location: upper back Intensity: 6 Type: aching Onset of pain  Constant   Primary Care Provider:  Dondra Spry DO  CC:  back pain.  History of Present Illness: 76 y/o female c/o onset of upper right back pain by shoulder for the past week she has taken  Tylenol taken with no reilef, Heat with some relief no chest pain or shortness of breath   Preventive Screening-Counseling & Management  Alcohol-Tobacco     Smoking Status: never  Allergies: 1)  ! Sulfa  Past History:  Past Medical History: Allergic rhinitis Diabetes mellitus, type II uncontrolled  Hypertension   Myocardial infarction, hx of     hx of low  back pain  diabetic retinopathy - Dr Perley Jain at [facility]  (vitrectomy left eye) hx of tubal pregnancy Coronary artery disease   hx of GI bleed on heparin   Past Surgical History: Cataract extraction Appendectomy   Tonsillectomy S/P right L3-4 laminotomy and microdiskectomy - Dr Jordan Likes   no colon cancer screening  no recent mammogram    Family History: Family History of CAD Female 1st degree relative <60 father with hx of CVA mother had intestinal  no cancer         Social History: 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   Lungs:  normal  respiratory effort and normal breath sounds.   Heart:  normal rate, regular rhythm, and no gallop.   Msk:  right upper thoracic discomfort with rotation and side bending of thoracic spine.   Skin:  no rash   Impression & Recommendations:  Problem # 1:  BACK PAIN, THORACIC REGION (ICD-724.1) right sided upper back pain.  probable strain.  use muscle relaxer as directed. Patient advised to call office if symptoms persist or worsen.  Her updated medication list for this problem includes:    Aspirin 81 Mg Tabs (Aspirin) .Marland Kitchen... Take 1 tablet by mouth once a day    Cyclobenzaprine Hcl 5 Mg Tabs (Cyclobenzaprine hcl) ..... One by mouth at bedtime as needed for upper back pain  Complete Medication List: 1)  Metoprolol Tartrate 50 Mg Tabs (Metoprolol tartrate) .... Take 1/2  tablet by mouth two times a day 2)  Janumet 50-500 Mg Tabs (Sitagliptin-metformin hcl) .... One by mouth two times a day 3)  Lipitor 40 Mg Tabs (Atorvastatin calcium) .... Take 1 tablet by mouth once a day 4)  Plavix 75 Mg Tabs (Clopidogrel bisulfate) .... Take 1 tablet by mouth every morning 5)  Lisinopril 20 Mg Tabs (Lisinopril) .... Take  1 tablet by mouth once a day 6)  Nitrostat 0.4 Mg Subl (Nitroglycerin) .... One tablet under tongue as needed as directed 7)  Ranitidine Hcl 150 Mg Tabs (Ranitidine hcl) .... One by mouth two times a day as needed for heartburn 8)  Aspirin 81 Mg Tabs (Aspirin) .... Take 1 tablet by mouth once a day 9)  Amlodipine Besylate 10 Mg Tabs (Amlodipine besylate) .... One by mouth once daily 10)  Cyclobenzaprine Hcl 5 Mg Tabs (Cyclobenzaprine hcl) .... One by mouth at bedtime as needed for upper back pain  Other Orders: Influenza Vaccine MCR ([address]) Administration Flu vaccine - MCR (U0454)  Patient Instructions: 1)  Call our office if your symptoms do not  improve or gets worse. Prescriptions: CYCLOBENZAPRINE HCL 5 MG TABS (CYCLOBENZAPRINE HCL) one by mouth at bedtime as needed for upper back  pain  #20 x 0   Entered and Authorized by:   D. Thomos Lemons DO   Signed by:   D. Thomos Lemons DO on 04/15/2010   Method used:   Electronically to        [id number] (retail)       [address] [place], [place]  [address]       Ph: [phone]       Fax: [phone]   RxID:   [id number]   Current Allergies (reviewed today): ! SULFA   Immunizations Administered:  Influenza Vaccine # 1:    Vaccine Type: Fluvax MCR    Site: right deltoid    Mfr: [employer]    Dose: 0.5 ml    Route: IM    Given by: Glendell Docker CMA    Exp. Date: 02/15/2011    Lot #: [id number]    VIS given: 03/12/2010  Flu Vaccine Consent Questions:    Do you have a history of severe allergic reactions to this vaccine? no    Any prior history of allergic reactions to egg and/or gelatin? no    Do you have a sensitivity to the preservative Thimersol? no    Do you have a past history of Guillan-Barre Syndrome? no    Do you currently have an acute febrile illness? no    Have you ever had a severe reaction to latex? no    Vaccine information given and explained to patient? yes    Are you currently pregnant? no

## 2010-09-17 NOTE — Miscellaneous (Signed)
Summary: Initial Summary for PT Services/[facility]   Initial Summary for PT Services/[facility]   Imported By: Maryln Gottron 07/16/2010 14:46:02  _____________________________________________________________________  External Attachment:    Type:   Image     Comment:   External Document

## 2010-09-17 NOTE — Assessment & Plan Note (Signed)
Summary: 2 MONTH FU/DT   Vital Signs:  Patient profile:   76 year old [name] Weight:      218.75 pounds BMI:     41.48 O2 Sat:      97 % on Room air Temp:     98.1 degrees F oral Pulse rate:   58 / minute Pulse rhythm:   regular Resp:     18 per minute BP sitting:   152 / 78  (right arm) Cuff size:   large  Vitals Entered By: Glendell Docker CMA (February 28, 2010 8:24 AM)  O2 Flow:  Room air  Contraindications/Deferment of Procedures/Staging:    Test/Procedure: Colonoscopy    Reason for deferment: patient declined  CC: Rm 3- 2 Month Follow up disease management, Type 2 diabetes mellitus follow-up Is Patient Diabetic? Yes Did you bring your meter with you today? No Pain Assessment Patient in pain? no       Does patient need assistance? Functional Status Self care Ambulation Normal   Primary Care Provider:  DThomos Lemons DO  CC:  Rm 3- 2 Month Follow up disease management and Type 2 diabetes mellitus follow-up.  History of Present Illness:  Type 2 Diabetes Mellitus Follow-Up      This is a 76 year old woman who presents for Type 2 diabetes mellitus follow-up.  The patient denies self managed hypoglycemia and hypoglycemia requiring help.  Since the last visit the patient reports compliance with medications and monitoring blood glucose.  Since the last visit, the patient reports having had eye care by an ophthalmologist.  low blood sugar 128, high 150's avg 130-140   patient states she had a Kenalog injection in her eye last week, and is now experiencing severe blurred vision, and floaters, she states she is going to call her eye doctor today,    Preventive Screening-Counseling & Management  Alcohol-Tobacco     Smoking Status: never  Allergies: 1)  ! Sulfa  Past History:  Past Medical History: Allergic rhinitis Diabetes mellitus, type II uncontrolled  Hypertension   Myocardial infarction, hx of  hx of low  back pain  diabetic retinopathy - Dr Perley Jain at  [facility]  (vitrectomy left eye) hx of tubal pregnancy Coronary artery disease   hx of GI bleed on heparin  Past Surgical History: Cataract extraction Appendectomy  Tonsillectomy S/P right L3-4 laminotomy and microdiskectomy - Dr Jordan Likes   no colon cancer screening  no recent mammogram   Family History: Family History of CAD [name] 1st degree relative <60 father with hx of CVA mother had intestinal  no cancer       Social History: 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   Eyes:  mild right eye conjunctival injection.  right pupil reactive to light Lungs:  normal respiratory effort and normal breath sounds.   Heart:  normal rate, regular rhythm, and no gallop.   Extremities:  .ed Neurologic:  cranial nerves II-XII intact.     Impression & Recommendations:  Problem # 1:  DIABETES MELLITUS, TYPE II (ICD-250.00) Assessment Unchanged Pt experiencing floaters in right eye.  She has f/u with her opthalmologist. blood sugar control is reasonable I encouraged exercise.  she has exercise bike that she doesn't use  Her updated medication list for this problem includes:    Janumet 50-500  Mg Tabs (Sitagliptin-metformin hcl) ..... One by mouth two times a day    Lisinopril 20 Mg Tabs (Lisinopril) .Marland Kitchen... Take 1 tablet by mouth once a day    Aspirin 81 Mg Tabs (Aspirin) .Marland Kitchen... Take 1 tablet by mouth once a day  Labs Reviewed: Creat: 1.03 (02/20/2010)     Last Eye Exam: diabetic retinopathy (05/02/2009) Reviewed HgBA1c results: 7.3 (02/20/2010)  6.4 (12/13/2009)  Problem # 2:  HYPERTENSION (ICD-401.9)  BP is still suboptimal.  home readings - SBP > 140.  her metoprolol dosing was changed to two times a day due to complaints of fatigue.  we discussed possibility of OSA.  she denies snoring.   she defer sleep test.  work on wt  loss.  consider home sleep study  Her updated medication list for this problem includes:    Metoprolol Tartrate 50 Mg Tabs (Metoprolol tartrate) .Marland Kitchen... Take 1/2  tablet by mouth two times a day    Lisinopril 20 Mg Tabs (Lisinopril) .Marland Kitchen... Take 1 tablet by mouth once a day    Amlodipine Besylate Elizabeth Mg Tabs (Amlodipine besylate) ..... One by mouth once daily  BP today: 152/78 Prior BP: 168/74 (12/28/2009)  Labs Reviewed: K+: 5.1 (02/20/2010) Creat: : 1.03 (02/20/2010)     Complete Medication List: 1)  Metoprolol Tartrate 50 Mg Tabs (Metoprolol tartrate) .... Take 1/2  tablet by mouth two times a day 2)  Janumet 50-500 Mg Tabs (Sitagliptin-metformin hcl) .... One by mouth two times a day 3)  Lipitor 40 Mg Tabs (Atorvastatin calcium) .... Take 1 tablet by mouth once a day 4)  Plavix 75 Mg Tabs (Clopidogrel bisulfate) .... Take 1 tablet by mouth every morning 5)  Lisinopril 20 Mg Tabs (Lisinopril) .... Take 1 tablet by mouth once a day 6)  Nitrostat 0.4 Mg Subl (Nitroglycerin) .... One tablet under tongue as needed as directed 7)  Ranitidine Hcl 150 Mg Tabs (Ranitidine hcl) .... One by mouth two times a day as needed for heartburn 8)  Aspirin 81 Mg Tabs (Aspirin) .... Take 1 tablet by mouth once a day 9)  Amlodipine Besylate Elizabeth Mg Tabs (Amlodipine besylate) .... One by mouth once daily  Patient Instructions: 1)  Please schedule a follow-up appointment in 3 months. 2)  BMP prior to visit, ICD-9: 401.9 3)  HbgA1C prior to visit, ICD-9: 250.00 4)  TSH, FLP:  272.4 5)  Please return for lab work one (1) week before your next appointment.  6)  The highlighted prescriptions were electronically sent to your pharmacy Prescriptions: RANITIDINE HCL 150 MG TABS (RANITIDINE HCL) one by mouth two times a day as needed for heartburn  #60 x 5   Entered and Authorized by:   D. Thomos Lemons DO   Signed by:   D. Thomos Lemons DO on 02/28/2010   Method used:   Electronically to        [id number] (retail)       [address] [place], [place]  [address]       Ph: [phone]       Fax: [phone]   RxID:   [id number] LISINOPRIL 20 MG TABS (LISINOPRIL) Take 1 tablet by mouth once a day  #30 x 5   Entered and Authorized by:   D. Thomos Lemons DO   Signed by:   D. Thomos Lemons DO on 02/28/2010   Method used:  Electronically to        [facility] (retail)       [address] [place], [place]  [address]       Ph: [phone]       Fax: [phone]   RxID:   [id number] JANUMET 50-500 MG TABS (SITAGLIPTIN-METFORMIN HCL) one by mouth two times a day  #60 x 5   Entered and Authorized by:   D. Thomos Lemons DO   Signed by:   D. Thomos Lemons DO on 02/28/2010   Method used:   Electronically to        [facility] (retail)       [address] [place], [place]  [address]       Ph: [phone]       Fax: [phone]   RxID:   [id number] AMLODIPINE BESYLATE Elizabeth MG TABS (AMLODIPINE BESYLATE) one by mouth once daily  #30 x 5   Entered and Authorized by:   D. Thomos Lemons DO   Signed by:   D. Thomos Lemons DO on 02/28/2010   Method used:   Electronically to        [facility] (retail)       [address] [place], [place]  [address]       Ph: [phone]       Fax: [phone]   RxID:   [id number]   Current Allergies (reviewed today): ! SULFA   Preventive Care Screening  Colonoscopy:    Date:  02/21/2010    Results:  Declined   Last Tetanus Booster:    Date:  03/02/2006    Results:  Historical

## 2010-09-17 NOTE — Consult Note (Signed)
Summary: [facility]  [facility]   Imported By: Lanelle Bal 10/16/2009 11:41:16  _____________________________________________________________________  External Attachment:    Type:   Image     Comment:   External Document

## 2010-09-17 NOTE — Progress Notes (Signed)
Summary: Glipizide Refill  Phone Note Call from Patient   Caller: Patient Details for Reason: medication Summary of Call: Pt  need rx for  Gliticide 2.5mg     send to [facility]       pt saw Dr Artist Pais Feb 3, forgot to get this medication refilled  Initial call taken by: Darral Dash,  September 28, 2009 11:23 AM  Follow-up for Phone Call        Phone Call Completed, rx sent electronically to pharmacy Follow-up by: Glendell Docker CMA,  September 28, 2009 4:41 PM    Prescriptions: GLIPIZIDE 2.5 MG XR24H-TAB (GLIPIZIDE) Take 1 tablet by mouth once a day  #30 x 2   Entered by:   Glendell Docker CMA   Authorized by:   D. Thomos Lemons DO   Signed by:   Glendell Docker CMA on 09/28/2009   Method used:   Electronically to        [facility] (retail)       [address] [place], [place]  [address]       Ph: [phone]       Fax: [phone]   RxID:   [id number]

## 2010-09-17 NOTE — Progress Notes (Signed)
Summary: Test Results  Phone Note Outgoing Call   Summary of Call: call pt - abdominal u/s in negative for gallbladder dz.  u/a shows UTI.  I suggest she take ceftin.  see rx Initial call taken by: D. Thomos Lemons DO,  September 21, 2009 12:09 PM  Follow-up for Phone Call        patient advised per Dr Artist Pais instructions Follow-up by: Glendell Docker CMA,  September 21, 2009 2:34 PM    New/Updated Medications: CEFUROXIME AXETIL 500 MG TABS (CEFUROXIME AXETIL) one by mouth two times a day Prescriptions: CEFUROXIME AXETIL 500 MG TABS (CEFUROXIME AXETIL) one by mouth two times a day  #14 x 0   Entered and Authorized by:   D. Thomos Lemons DO   Signed by:   D. Thomos Lemons DO on 09/21/2009   Method used:   Electronically to        [facility] (retail)       [address] [place], [place]  [address]       Ph: [phone]       Fax: [phone]   RxID:   [id number]

## 2010-09-17 NOTE — Letter (Signed)
Summary: [facility]  [facility]   Imported By: Lanelle Bal 05/30/2010 12:35:35  _____________________________________________________________________  External Attachment:    Type:   Image     Comment:   External Document

## 2010-09-17 NOTE — Progress Notes (Signed)
Summary: Patient's status  Phone Note Call from Patient Call back at Home Phone [phone]   Caller: Patient Call For: D. Thomos Lemons DO Summary of Call: Pt called to report that she did have one episode of fever & chills last night but feels better this morning, she will start her oral medication today.  Pt wants to know when you should return for an OV. Initial call taken by: Lannette Donath,  Dec 18, 2009 9:24 AM  Follow-up for Phone Call        1 week Follow-up by: D. Thomos Lemons DO,  Dec 18, 2009 1:28 PM  Additional Follow-up for Phone Call Additional follow up Details #1::        Scheduled pt an OV 12/28/09 8:15am Additional Follow-up by: Lannette Donath,  Dec 18, 2009 1:45 PM

## 2010-09-17 NOTE — Assessment & Plan Note (Signed)
Summary: TO EST  /HEA   Vital Signs:  Patient profile:   76 year old female Height:      61 inches Weight:      223 pounds BMI:     42.29 O2 Sat:      100 % on Room air Temp:     97.5 degrees F oral Pulse rate:   60 / minute Pulse rhythm:   regular Resp:     20 per minute BP sitting:   154 / 90  (right arm) Cuff size:   large  Vitals Entered By: Glendell Docker CMA (September 20, 2009 9:26 AM)  O2 Flow:  Room air  Primary Care Provider:  D. Thomos Lemons DO  CC:  New Patient.  History of Present Illness: New Patient   76 y/o female to establish Prev followed by Dr. Ninfa Meeker.   DM II - since 2007,  hx of poor compliance  she monitors her blood sugars - gluocometer (freestyle ) 110 -115  she denies  hypoglycemia  She was seen in [facility] ER for chest pain in 08/24/2009 Prev seen by cardiologist - pt told she may have had light heart attacks heart cath - 08/27/2009 [facility] cardiology - Dr. Dot Been / Dr. Judithe Modest -  stent placed) she is on plavix no recurrent chest pain  she c/o intermittent nausea she is not sure if symptoms related to meds or food   Preventive Screening-Counseling & Management  Alcohol-Tobacco     Alcohol drinks/day: 0     Smoking Status: never  Caffeine-Diet-Exercise     Caffeine use/day: 1 beverage      Does Patient Exercise: no  Allergies (verified): 1)  ! Sulfa  Past History:  Past Medical History: Allergic rhinitis Diabetes mellitus, type II uncontrolled Hypertension Myocardial infarction, hx of  hx of low  back pain  diabetic retinopathy - Dr Perley Jain at [facility]  (vitrectomy left eye) hx of tubal pregnancy Coronary artery disease  Past Surgical History: Cataract extraction Appendectomy Tonsillectomy S/P right L3-4 laminotomy and microdiskectomy - Dr Jordan Likes  no colon cancer screening no recent mammogram  Family History: Family History of CAD Female 1st degree relative <60 father with hx of CVA mother had intestinal  no cancer  Social  History: 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    Physical Exam  General:  alert and overweight-appearing.   Head:  normocephalic and atraumatic.   Eyes:  pupils equal, pupils round, and pupils reactive to light.  prev cataract surgery Ears:  R ear normal and L ear normal.   Mouth:  Oral mucosa and oropharynx without lesions or exudates.   Neck:  No deformities, masses, or tenderness noted.no carotid bruits.   Lungs:  normal respiratory effort, normal breath sounds, and no wheezes.   Heart:  normal rate, regular rhythm, no murmur, and no gallop.   Abdomen:  obese, mild diffuse tenderness, unable to appreciate mass Extremities:  trace left pedal edema and trace right pedal edema.    Diabetes Management Exam:    Foot Exam (with socks and/or shoes not present):       Sensory-Monofilament:          Left  foot: absent          Right foot: absent       Inspection:          Left foot: normal          Right foot: normal    Eye Exam:       Eye Exam done elsewhere          Date: 05/02/2009          Results: diabetic retinopathy          Done by: Dr. Perley Jain [facility])   Impression & Recommendations:  Problem # 1:  NAUSEA AND VOMITING (ICD-787.01) Pt with nausea and mild diffuse abd tenderness.  rule out gallbladder dz.   consider diabetic gastroparesis.  eat smaller meals.  use zofran as needed.  Patient advised to call office if symptoms persist or worsen.  Orders: T-Hepatic Function [id number]) Radiology Referral (Radiology)  Problem # 2:  DIABETES MELLITUS, TYPE II (ICD-250.00) Hx of poor compliance.  Cr 08/24/09 - .9.  we stressed importance of  lifestyle compliance.  refer to nutrionist for additional diabetic education.  I stressed importance of avoiding hypoglycemia.  pt advised to hold glipizide if poor p.o. intake  Her updated medication list for this problem includes:    Janumet 50-500 Mg Tabs (Sitagliptin-metformin hcl) ..... One by mouth two times a day    Glipizide 2.5 Mg Xr24h-tab (Glipizide) .Marland Kitchen... Take 1 tablet by mouth once a day    Lisinopril 5 Mg Tabs (Lisinopril) .Marland Kitchen... Take 1 tablet by mouth once a day    Aspirin 81 Mg Tabs (Aspirin) .Marland Kitchen... Take 1 tablet by mouth once a day  Orders: T-Basic Metabolic Panel [id number]) T- Hemoglobin A1C ([id number]) T-Urine Microalbumin w/creat. ratio ([id number]) Nutrition Referral (Nutrition)  Problem # 3:  HEMATURIA UNSPECIFIED (ICD-599.70) pt notes prev physician noted hematuria which was attributed to uti.  she has not noticed any gross hematuria.  repeat u/a.  Orders: T-Urinalysis ([id number])  Her updated medication list for this problem includes:    Cefuroxime Axetil 500 Mg Tabs (Cefuroxime axetil) ..... One by mouth two times a day  Problem # 4:  HYPERTENSION (ICD-401.9) Pt did not take her meds this morning.  I urged compliance Her updated medication list for this problem includes:    Metoprolol Tartrate 50 Mg Tabs (Metoprolol tartrate) .Marland Kitchen... Take 1 tablet by mouth two times a day    Lisinopril 5 Mg Tabs (Lisinopril) .Marland Kitchen... Take 1 tablet by mouth once a day  BP today: 154/90  Problem # 5:  CORONARY ARTERY DISEASE (ICD-414.00) non STEMI in 08/24/2009.  seen by [facility].  obtain med records.  Her updated medication list for this problem includes:    Metoprolol Tartrate 50 Mg Tabs (Metoprolol tartrate) .Marland Kitchen... Take 1 tablet by mouth two times a day    Plavix 75 Mg Tabs (Clopidogrel bisulfate) .Marland Kitchen... Take 1 tablet by mouth every morning    Lisinopril 5 Mg Tabs (Lisinopril) .Marland Kitchen... Take 1 tablet by mouth once a day    Nitrostat 0.4 Mg Subl (Nitroglycerin)  ..... One tablet under tongue as needed as directed    Aspirin 81 Mg Tabs (Aspirin) .Marland Kitchen... Take 1 tablet by mouth once a day  Complete Medication List: 1)  Metoprolol Tartrate 50 Mg Tabs (Metoprolol tartrate) .... Take 1 tablet by mouth two times a day 2)  Janumet 50-500 Mg Tabs (Sitagliptin-metformin hcl) .... One by mouth two times a day 3)  Lipitor 80 Mg Tabs (Atorvastatin calcium) .Marland KitchenMarland KitchenMarland Kitchen  Take 1 tablet by mouth once a day 4)  Plavix 75 Mg Tabs (Clopidogrel bisulfate) .... Take 1 tablet by mouth every morning 5)  Glipizide 2.5 Mg Xr24h-tab (Glipizide) .... Take 1 tablet by mouth once a day 6)  Lisinopril 5 Mg Tabs (Lisinopril) .... Take 1 tablet by mouth once a day 7)  Nitrostat 0.4 Mg Subl (Nitroglycerin) .... One tablet under tongue as needed as directed 8)  Ranitidine Hcl 150 Mg Tabs (Ranitidine hcl) .... One by mouth two times a day as needed for heartburn 9)  Aspirin 81 Mg Tabs (Aspirin) .... Take 1 tablet by mouth once a day 10)  Cefuroxime Axetil 500 Mg Tabs (Cefuroxime axetil) .... One by mouth two times a day  Patient Instructions: 1)  Eat smaller meals 2)  Please schedule a follow-up appointment in 1 month. 3)  Bring your glucometer and/or blood sugar log to your next appointment 4)  Take your blood pressure medication daily 5)  Bring copy of your medical records to your next appointment Prescriptions: RANITIDINE HCL 150 MG TABS (RANITIDINE HCL) one by mouth two times a day as needed for heartburn  #60 x 1   Entered and Authorized by:   D. Thomos Lemons DO   Signed by:   D. Thomos Lemons DO on 09/20/2009   Method used:   Electronically to        [facility] (retail)       [address] [place], [place]  [address]       Ph: [phone]       Fax: [phone]   RxID:   [id number] JANUMET 50-500 MG TABS (SITAGLIPTIN-METFORMIN HCL) one by mouth two times a day  #60 x 1   Entered and Authorized by:   D. Thomos Lemons DO   Signed by:   D.  Thomos Lemons DO on 09/20/2009   Method used:   Electronically to        [facility] (retail)       [address] [place], [place]  [address]       Ph: [phone]       Fax: [phone]   RxID:   [id number]    Immunization History:  Pneumovax Immunization History:    Pneumovax:  historical (09/22/2006)    Preventive Care Screening  Mammogram:    Date:  09/20/2009    Results:  Declined  Pap Smear:    Date:  09/20/2009    Results:  Declined  Bone Density:    Date:  09/22/2006    Results:  normal std dev  Last Pneumovax:    Date:  09/22/2006    Results:  Historical    Current Allergies (reviewed today): ! SULFA

## 2010-09-17 NOTE — Miscellaneous (Signed)
Summary: Lab Orders for October appt  Clinical Lists Changes  Orders: Added new Test order of T-Basic Metabolic Panel [phone]) - Signed Added new Test order of T- Hemoglobin A1C [id number]) - Signed Added new Test order of T-TSH [id number]) - Signed Added new Test order of T-Lipid Profile ([id number]) - Signed

## 2010-09-19 NOTE — Assessment & Plan Note (Signed)
Summary: F/U/LP   Vital Signs:  Patient profile:   76 year old female Height:      61 inches (154.94 cm) Weight:      225.0 pounds (102.27 kg) BMI:     42.67 Temp:     97.4 degrees F (36.33 degrees C) oral Pulse rate:   59 / minute BP sitting:   190 / 68  (left arm)  Vitals Entered By: Baxter Hire) (August 29, 2010 9:59 AM) CC: follow-up visit Pain Assessment Patient in pain? yes     Location: shoulder Intensity: 5 Nutritional Status BMI of > 30 = obese  Does patient need assistance? Functional Status Self care Ambulation Normal   Primary Care Provider:  Dondra Spry DO  CC:  follow-up visit.  History of Present Illness: 76 yo F here for f/u right shoulder pain.  Patient was last seen 3 months ago. Developed slowly worsening right shoulder pain Worse with reaching, overhead activities, trying to fasten bra. Had no acute injury Pain waking up at nighttime. Full motion but painful. X-rays at the time were negative for obvious DJD or other abnormalities. Given cortisone injection and enrolled in PT Has been taking celebrex as well. Icing, modalities in PT as well as exercises. Is right handed Pain is about the same - injection did not help much. Feels some better after PT but only lasts about 1 day.  Habits & Providers  Alcohol-Tobacco-Diet     Alcohol drinks/day: 0     Tobacco Status: never  Problems Prior to Update: 1)  Paronychia, Toe  (ICD-681.11) 2)  Back Pain, Lumbar, Chronic  (ICD-724.2) 3)  Shoulder Pain, Right  (ICD-719.41) 4)  Eczema  (ICD-692.9) 5)  Back Pain, Thoracic Region  (ICD-724.1) 6)  Uti  (ICD-599.0) 7)  Coronary Artery Disease  (ICD-414.00) 8)  Hematuria Unspecified  (ICD-599.70) 9)  Nausea and Vomiting  (ICD-787.01) 10)  Family History of Cad Female 1st Degree Relative <60  (ICD-V16.49) 11)  Myocardial Infarction, Hx of  (ICD-412) 12)  Hypertension  (ICD-401.9) 13)  Diabetes Mellitus, Type II  (ICD-250.00) 14)  Allergic  Rhinitis  (ICD-477.9)  Medications Prior to Update: 1)  Metoprolol Tartrate 50 Mg Tabs (Metoprolol Tartrate) .... Take 1/2  Tablet By Mouth Two Times A Day 2)  Janumet 50-500 Mg Tabs (Sitagliptin-Metformin Hcl) .... One By Mouth Two Times A Day 3)  Lipitor 40 Mg Tabs (Atorvastatin Calcium) .... Take 1 Tablet By Mouth Once A Day 4)  Plavix 75 Mg Tabs (Clopidogrel Bisulfate) .... Take 1 Tablet By Mouth Every Morning 5)  Lisinopril 20 Mg Tabs (Lisinopril) .... Take 1 Tablet By Mouth Once A Day 6)  Nitrostat 0.4 Mg Subl (Nitroglycerin) .... One Tablet Under Tongue As Needed As Directed 7)  Ranitidine Hcl 150 Mg Tabs (Ranitidine Hcl) .... One By Mouth Two Times A Day As Needed For Heartburn 8)  Aspirin 81 Mg Tabs (Aspirin) .... Take 1 Tablet By Mouth Once A Day 9)  Amlodipine Besylate 10 Mg Tabs (Amlodipine Besylate) .... One By Mouth Once Daily 10)  Nateglinide 120 Mg Tabs (Nateglinide) .... One Tab By Mouth Three Times A Day As Directed 11)  Triamcinolone Acetonide 0.5 % Oint (Triamcinolone Acetonide) .... Apply Two Times A Day To Affected Areas 12)  Cephalexin 500 Mg Caps (Cephalexin) .... One By Mouth Three Times A Day  Allergies: 1)  ! Sulfa 2)  Cyclobenzaprine Hcl (Cyclobenzaprine Hcl)  Family History: Reviewed history from 08/08/2010 and no changes required. Family History  of CAD Female 1st degree relative <60 father with hx of CVA mother had intestinal  no cancer            Social History: Reviewed history from 06/19/2010 and no changes required. 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   Msk:  R shoulder: No gross deformity, swelling, or bruising. TTP diffusely about posterior right shoulder including AC joint.  No TTP biceps tendon.  No trapezius or neck TTP. FROM with painful arc.  No frozen  shoulder. Strength 4/5 with resisted empty can reproducing her pain.  5/5 strength with resisted IR/ER without pain. Positive hawkins and equivocal neers.  Negative speeds. NVI distally.  L shoulder: FROM without pain. Strength 5/5 with empty can, resisted IR/ER. Additional Exam:  MSK U/s R shoulder: Mild arthritic changes of AC joint.  Biceps tendon intact without abnormalities.  Subscapularis and infraspinatus/teres minor intact without abnormalities.  Supraspinatus not retracted but very distal noted probable defect.  Very thickened subacromial bursa.  Unable to reproduce subacromial impingement on u/s.   Impression & Recommendations:  Problem # 1:  SHOULDER PAIN, RIGHT (ICD-719.41) Assessment Unchanged Not improving after 3 months of injection, PT, conservative care.  Will proceed with MRI as orthopedic surgeon will require this.  If tear confirmed will recommend she get input from surgeon.  Definite subacromial bursitis and thickening but would expect injection and PT to improve this after at least 6 weeks.  Will call her on how we will proceed based on results.  Her updated medication list for this problem includes:    Aspirin 81 Mg Tabs (Aspirin) .Marland Kitchen... Take 1 tablet by mouth once a day  Orders: MRI without Contrast (MRI w/o Contrast)  Complete Medication List: 1)  Metoprolol Tartrate 50 Mg Tabs (Metoprolol tartrate) .... Take 1/2  tablet by mouth two times a day 2)  Janumet 50-500 Mg Tabs (Sitagliptin-metformin hcl) .... One by mouth two times a day 3)  Lipitor 40 Mg Tabs (Atorvastatin calcium) .... Take 1 tablet by mouth once a day 4)  Plavix 75 Mg Tabs (Clopidogrel bisulfate) .... Take 1 tablet by mouth every morning 5)  Lisinopril 20 Mg Tabs (Lisinopril) .... Take 1 tablet by mouth once a day 6)  Nitrostat 0.4 Mg Subl (Nitroglycerin) .... One tablet under tongue as needed as directed 7)  Ranitidine Hcl 150 Mg Tabs (Ranitidine hcl) .... One by mouth two times a day as needed  for heartburn 8)  Aspirin 81 Mg Tabs (Aspirin) .... Take 1 tablet by mouth once a day 9)  Amlodipine Besylate 10 Mg Tabs (Amlodipine besylate) .... One by mouth once daily 10)  Nateglinide 120 Mg Tabs (Nateglinide) .... One tab by mouth three times a day as directed 11)  Triamcinolone Acetonide 0.5 % Oint (Triamcinolone acetonide) .... Apply two times a day to affected areas 12)  Cephalexin 500 Mg Caps (Cephalexin) .... One by mouth three times a day  Patient Instructions: 1)  You definitely have bursitis but I would expect the shot and therapy to have helped you more with this. 2)  It does appear that you have a small tear in the supraspinatus - to confirm this we will do an MRI.  I will call you with the results when I get this. 3)  You can continue  to do therapy and ice your shoulder in the meantime.   Orders Added: 1)  MRI without Contrast [MRI w/o Contrast] 2)  Est. Patient Level III [[address]]

## 2010-09-19 NOTE — Miscellaneous (Signed)
Summary: PT Discharge/[facility]  PT Discharge/[facility]   Imported By: Lanelle Bal 08/29/2010 11:09:14  _____________________________________________________________________  External Attachment:    Type:   Image     Comment:   External Document

## 2010-09-19 NOTE — Miscellaneous (Signed)
Summary: Orders Update  Clinical Lists Changes  Orders: Added new Test order of T-Basic Metabolic Panel ([id number]) - Signed Added new Test order of T- Hemoglobin A1C ([id number]) - Signed 

## 2010-09-19 NOTE — Assessment & Plan Note (Signed)
Summary: 2 month follow up/[facility]   Vital Signs:  Patient profile:   76 year old female Height:      61 inches Weight:      223.25 pounds BMI:     42.34 O2 Sat:      100 % on Room air Temp:     97.8 degrees F oral Pulse rate:   54 / minute Resp:     22 per minute BP sitting:   150 / 70  (left arm) Cuff size:   large  Vitals Entered By: Glendell Docker CMA (August 08, 2010 10:11 AM)  O2 Flow:  Room air CC: 2 Month Follow up Is Patient Diabetic? Yes Did you bring your meter with you today? Yes Pain Assessment Patient in pain? no        Primary Care Provider:  Dondra Spry DO  CC:  2 Month Follow up.  History of Present Illness:  76 y/o female c/o pain of right great toe.  she has ingrown toe nail no improvement with self care measures  DM II - low blood sugar 125, high 150 avg 130-135   Preventive Screening-Counseling & Management  Alcohol-Tobacco     Smoking Status: never  Allergies: 1)  ! Sulfa 2)  Cyclobenzaprine Hcl (Cyclobenzaprine Hcl)  Past History:  Past Medical History: Allergic rhinitis Diabetes mellitus, type II - (6-7 yrs) Hypertension    Myocardial infarction, hx of        hx of low  back pain  diabetic retinopathy - Dr Perley Jain at [facility]  (vitrectomy left eye) hx of tubal pregnancy Coronary artery disease   hx of GI bleed on heparin   Past Surgical History: Cataract extraction Appendectomy    Tonsillectomy S/P right L3-4 laminotomy and microdiskectomy - Dr Jordan Likes   no colon cancer screening   no recent mammogram     Family History: Family History of CAD Female 1st degree relative <60 father with hx of CVA mother had intestinal  no cancer            Physical Exam  General:  alert, well-developed, and well-nourished.   Lungs:  normal respiratory effort and normal breath sounds.   Heart:  normal rate, regular rhythm, and no gallop.   Extremities:  trace left pedal edema and trace right pedal edema.     Neurologic:  cranial nerves II-XII intact and gait normal.     Impression & Recommendations:  Problem # 1:  PARONYCHIA, TOE (ICD-681.11)  The following medications were removed from the medication list:    Cephalexin 500 Mg Caps (Cephalexin) ..... One by mouth three times a day Her updated medication list for this problem includes:    Cephalexin 500 Mg Caps (Cephalexin) ..... One by mouth three times a day  Orders: Podiatry Referral (Podiatry)   Take antibiotics as directed and take acetaminophen as needed.  Patient advised to call office if symptoms persist or worsen.  Problem # 2:  DIABETES MELLITUS, TYPE II (ICD-250.00) Assessment: Unchanged  Her updated medication list for this problem includes:    Janumet 50-500 Mg Tabs (Sitagliptin-metformin hcl) ..... One by mouth two times a day    Lisinopril 20 Mg Tabs (Lisinopril) .Marland Kitchen... Take 1 tablet by mouth once a day    Aspirin 81 Mg Tabs (Aspirin) .Marland Kitchen... Take 1 tablet by mouth once a day    Nateglinide 120 Mg Tabs (Nateglinide) ..... One tab by mouth three times a day as directed  Labs Reviewed:  Creat: 1.12 (08/01/2010)     Last Eye Exam: diabetic retinopathy (05/02/2009) Reviewed HgBA1c results: 8.1 (08/01/2010)  8.3 (05/16/2010)  Complete Medication List: 1)  Metoprolol Tartrate 50 Mg Tabs (Metoprolol tartrate) .... Take 1/2  tablet by mouth two times a day 2)  Janumet 50-500 Mg Tabs (Sitagliptin-metformin hcl) .... One by mouth two times a day 3)  Lipitor 40 Mg Tabs (Atorvastatin calcium) .... Take 1 tablet by mouth once a day 4)  Plavix 75 Mg Tabs (Clopidogrel bisulfate) .... Take 1 tablet by mouth every morning 5)  Lisinopril 20 Mg Tabs (Lisinopril) .... Take 1 tablet by mouth once a day 6)  Nitrostat 0.4 Mg Subl (Nitroglycerin) .... One tablet under tongue as needed as directed 7)  Ranitidine Hcl 150 Mg Tabs (Ranitidine hcl) .... One by mouth two times a day as needed for heartburn 8)  Aspirin 81 Mg Tabs (Aspirin) ....  Take 1 tablet by mouth once a day 9)  Amlodipine Besylate 10 Mg Tabs (Amlodipine besylate) .... One by mouth once daily 10)  Nateglinide 120 Mg Tabs (Nateglinide) .... One tab by mouth three times a day as directed 11)  Triamcinolone Acetonide 0.5 % Oint (Triamcinolone acetonide) .... Apply two times a day to affected areas 12)  Cephalexin 500 Mg Caps (Cephalexin) .... One by mouth three times a day  Patient Instructions: 1)  Our office will contact you re:  podiatry referral 2)  Please schedule a follow-up appointment in 4 months. 3)  BMP prior to visit, ICD-9:  401.9 4)  HbgA1C prior to visit, ICD-9:  250.02 5)  Urine Microalbumin prior to visit, ICD-9:  250.2 6)  Please return for lab work one (1) week before your next appointment.  Prescriptions: NATEGLINIDE 120 MG TABS (NATEGLINIDE) one tab by mouth three times a day as directed  #90 x 1   Entered and Authorized by:   D. Thomos Lemons DO   Signed by:   D. Thomos Lemons DO on 08/08/2010   Method used:   Electronically to        [facility] (retail)       [address] [place], [place]  [address]       Ph: [phone]       Fax: [phone]   RxID:   [id number] CEPHALEXIN 500 MG CAPS (CEPHALEXIN) one by mouth three times a day  #21 x 0   Entered and Authorized by:   D. Thomos Lemons DO   Signed by:   D. Thomos Lemons DO on 08/08/2010   Method used:   Electronically to        [facility] (retail)       [address] [place], [place]  [address]       Ph: [phone]       Fax: [phone]   RxID:   [id number]    Orders Added: 1)  Podiatry Referral [Podiatry] 2)  Est. Patient Level III [[address]093]    Current Allergies (reviewed today): ! SULFA CYCLOBENZAPRINE HCL (CYCLOBENZAPRINE HCL)

## 2010-09-19 NOTE — Miscellaneous (Signed)
Summary: PT Progress Note/[facility]  [facility]/ [facility]   Imported By: Lanelle Bal 09/03/2010 09:28:41  _____________________________________________________________________  External Attachment:    Type:   Image     Comment:   External Document

## 2010-09-19 NOTE — Letter (Signed)
Summary: *Referral Letter  [facility]  [address]   [place], [place] [address]   Phone: [phone]  Fax:     09/02/2010  Thank you in advance for agreeing to see my patient:  [facility] [address] [place], [place]  [address]  Phone: [phone]  Reason for Referral:  76 yo F with h/o HTN, DM, heart disease referring for right supraspinatus tear, long head biceps tear for consideration of surgical repair.  Patient initially seen with insidious onset right shoulder pain without injury over past few months.  Tried NSAIDs, PT, subacromial cortisone injection without relief.  Advised to f/u in 6 weeks but returned 3 months following initial visit for reevaluation with continued complaints - limited ultrasound in office showed probable insertional supraspinatus tear, tendinopathy, and bursitis, AC DJD - MRI confirmed at least partial thickness rotator cuff tear with retraction, tear of long head biceps tendon, AC DJD.  Referral for consideration of surgical rotator cuff repair.  Current Medical Problems: 1)  PARONYCHIA, TOE (ICD-681.11) 2)  BACK PAIN, LUMBAR, CHRONIC (ICD-724.2) 3)  SHOULDER PAIN, RIGHT (ICD-719.41) 4)  ECZEMA (ICD-692.9) 5)  BACK PAIN, THORACIC REGION (ICD-724.1) 6)  UTI (ICD-599.0) 7)  CORONARY ARTERY DISEASE (ICD-414.00) 8)  HEMATURIA UNSPECIFIED (ICD-599.70) 9)  NAUSEA AND VOMITING (ICD-787.01) 10)  FAMILY HISTORY OF CAD FEMALE 1ST DEGREE RELATIVE <60 (ICD-V16.49) 11)  MYOCARDIAL INFARCTION, HX OF (ICD-412) 12)  HYPERTENSION (ICD-401.9) 13)  DIABETES MELLITUS, TYPE II (ICD-250.00) 14)  ALLERGIC RHINITIS (ICD-477.9)   Current Medications: 1)  METOPROLOL TARTRATE 50 MG TABS (METOPROLOL TARTRATE) Take 1/2  tablet by mouth two times a day 2)  JANUMET 50-500 MG TABS (SITAGLIPTIN-METFORMIN HCL) one by mouth two times a day 3)  LIPITOR 40 MG TABS (ATORVASTATIN CALCIUM) Take 1 tablet by mouth once a day 4)  PLAVIX 75 MG TABS  (CLOPIDOGREL BISULFATE) Take 1 tablet by mouth every morning 5)  LISINOPRIL 20 MG TABS (LISINOPRIL) Take 1 tablet by mouth once a day 6)  NITROSTAT 0.4 MG SUBL (NITROGLYCERIN) one tablet under tongue as needed as directed 7)  RANITIDINE HCL 150 MG TABS (RANITIDINE HCL) one by mouth two times a day as needed for heartburn 8)  ASPIRIN 81 MG TABS (ASPIRIN) Take 1 tablet by mouth once a day 9)  AMLODIPINE BESYLATE 10 MG TABS (AMLODIPINE BESYLATE) one by mouth once daily 10)  NATEGLINIDE 120 MG TABS (NATEGLINIDE) one tab by mouth three times a day as directed 11)  TRIAMCINOLONE ACETONIDE 0.5 % OINT (TRIAMCINOLONE ACETONIDE) apply two times a day to affected areas 12)  CEPHALEXIN 500 MG CAPS (CEPHALEXIN) one by mouth three times a day   Past Medical History: 1)  Allergic rhinitis 2)  Diabetes mellitus, type II - (6-7 yrs) 3)  Hypertension    4)  Myocardial infarction, hx of        5)  hx of low  back pain  6)  diabetic retinopathy - Dr Perley Jain at [facility]  (vitrectomy left eye) 7)  hx of tubal pregnancy 8)  Coronary artery disease   9)  hx of GI bleed on heparin    Prior History of Blood Transfusions:   Pertinent Labs:    Thank you again for agreeing to see our patient; please contact us if you have any further questions or need additional information.  Sincerely,  Norton Blizzard MD

## 2010-09-20 ENCOUNTER — Encounter: Payer: Self-pay | Admitting: Internal Medicine

## 2010-09-23 ENCOUNTER — Encounter: Payer: Self-pay | Admitting: Internal Medicine

## 2010-10-09 NOTE — Letter (Signed)
Summary: [facility]   Imported By: Maryln Gottron 10/03/2010 15:38:18  _____________________________________________________________________  External Attachment:    Type:   Image     Comment:   External Document

## 2010-10-30 ENCOUNTER — Encounter: Payer: Self-pay | Admitting: Internal Medicine

## 2010-11-03 LAB — BASIC METABOLIC PANEL
BUN: 22 mg/dL (ref 6–23)
CO2: 29 mEq/L (ref 19–32)
Calcium: 9.4 mg/dL (ref 8.4–10.5)
Chloride: 103 mEq/L (ref 96–112)
Creatinine, Ser: 0.9 mg/dL (ref 0.4–1.2)
GFR calc Af Amer: >60 mL/min (ref >60)
GFR calc non Af Amer: >60 mL/min (ref >60)
Glucose, Bld: 328 mg/dL — ABNORMAL HIGH (ref 70–99)
Potassium: 5.2 mEq/L — ABNORMAL HIGH (ref 3.5–5.1)
Sodium: 142 mEq/L (ref 135–145)

## 2010-11-03 LAB — CBC
HCT: 46.5 % — ABNORMAL HIGH (ref 36.0–46.0)
Hemoglobin: 15.7 g/dL — ABNORMAL HIGH (ref 12.0–15.0)
MCHC: 33.8 g/dL (ref 30.0–36.0)
MCV: 89.7 fL (ref 78.0–100.0)
Platelets: 204 10*3/uL (ref 150–400)
RBC: 5.18 MIL/uL — ABNORMAL HIGH (ref 3.87–5.11)
RDW: 13.2 % (ref 11.5–15.5)
WBC: 11.4 10*3/uL — ABNORMAL HIGH (ref 4.0–10.5)

## 2010-11-03 LAB — DIFFERENTIAL
Basophils Absolute: 0.1 10*3/uL (ref 0.0–0.1)
Basophils Relative: 1 % (ref 0–1)
Eosinophils Absolute: 0 10*3/uL (ref 0.0–0.7)
Eosinophils Relative: 0 % (ref 0–5)
Lymphocytes Relative: 9 % — ABNORMAL LOW (ref 12–46)
Lymphs Abs: 1.1 10*3/uL (ref 0.7–4.0)
Monocytes Absolute: 0.4 10*3/uL (ref 0.1–1.0)
Monocytes Relative: 4 % (ref 3–12)
Neutro Abs: 9.8 10*3/uL — ABNORMAL HIGH (ref 1.7–7.7)
Neutrophils Relative %: 86 % — ABNORMAL HIGH (ref 43–77)

## 2010-11-03 LAB — POCT CARDIAC MARKERS
CKMB, poc: 24 ng/mL (ref 1.0–8.0)
Myoglobin, poc: 371 ng/mL (ref 12–200)
Troponin i, poc: 1.94 ng/mL (ref 0.00–0.09)

## 2010-11-05 LAB — URINE CULTURE

## 2010-11-05 LAB — CBC
HCT: 40.1 % (ref 36.0–46.0)
Hemoglobin: 13 g/dL (ref 12.0–15.0)
Platelets: 180 10*3/uL (ref 150–400)
RBC: 4.43 MIL/uL (ref 3.87–5.11)
WBC: 6.7 10*3/uL (ref 4.0–10.5)

## 2010-11-05 LAB — BASIC METABOLIC PANEL
GFR calc Af Amer: >60 mL/min (ref >60)
GFR calc non Af Amer: 55 mL/min — ABNORMAL LOW (ref >60)
Potassium: 4.2 mEq/L (ref 3.5–5.1)
Sodium: 144 mEq/L (ref 135–145)

## 2010-11-05 LAB — URINE MICROSCOPIC-ADD ON

## 2010-11-05 LAB — URINALYSIS, ROUTINE W REFLEX MICROSCOPIC
Bilirubin Urine: NEGATIVE
Glucose, UA: NEGATIVE mg/dL
Ketones, ur: 15 mg/dL — AB
pH: 5.5 (ref 5.0–8.0)

## 2010-11-05 LAB — DIFFERENTIAL
Eosinophils Relative: 1 % (ref 0–5)
Lymphocytes Relative: 12 % (ref 12–46)
Lymphs Abs: 0.8 10*3/uL (ref 0.7–4.0)
Monocytes Absolute: 0.4 10*3/uL (ref 0.1–1.0)

## 2010-11-05 NOTE — Miscellaneous (Signed)
Summary: Orders Update  Clinical Lists Changes  Orders: Added new Test order of T-Basic Metabolic Panel [phone]) - Signed Added new Test order of T- Hemoglobin A1C ([id number]) - Signed Added new Test order of T-Urine Microalbumin w/creat. ratio [phone]) - Signed

## 2010-11-13 ENCOUNTER — Telehealth: Payer: Self-pay | Admitting: *Deleted

## 2010-11-13 NOTE — Telephone Encounter (Signed)
Patient called and left voice message stating she is scheduled for preop in April and would like to pick up her lab results for that appointment.

## 2010-11-14 NOTE — Telephone Encounter (Signed)
Patient informed labs results left at front desk for patient pick up

## 2010-11-15 ENCOUNTER — Telehealth: Payer: Self-pay | Admitting: *Deleted

## 2010-11-15 MED ORDER — SITAGLIPTIN PHOS-METFORMIN HCL 50-500 MG PO TABS: 1.0000 | ORAL_TABLET | Freq: Two times a day (BID) | ORAL | Status: DC

## 2010-11-15 MED ORDER — AMLODIPINE BESYLATE 10 MG PO TABS: 10.0000 mg | ORAL_TABLET | Freq: Every day | ORAL | Status: DC

## 2010-11-15 NOTE — Telephone Encounter (Signed)
Refills called to April at [facility].

## 2010-11-28 ENCOUNTER — Telehealth: Payer: Self-pay | Admitting: Internal Medicine

## 2010-11-28 ENCOUNTER — Ambulatory Visit: Payer: Self-pay | Admitting: Internal Medicine

## 2010-11-28 DIAGNOSIS — I10 Essential (primary) hypertension: Secondary | ICD-10-CM

## 2010-11-28 NOTE — Telephone Encounter (Signed)
Refill-lisinopril 20mg  tablet woc. Take 1 tablet by mouth once a day. Last fill 3.14.12

## 2010-11-29 MED ORDER — LISINOPRIL 20 MG PO TABS: 20.0000 mg | ORAL_TABLET | Freq: Every day | ORAL | Status: DC

## 2010-11-29 NOTE — Telephone Encounter (Signed)
rx refill sent to pharmacy 

## 2010-12-05 ENCOUNTER — Ambulatory Visit: Payer: Self-pay | Admitting: Internal Medicine

## 2010-12-13 ENCOUNTER — Ambulatory Visit: Payer: Self-pay | Admitting: Internal Medicine

## 2010-12-16 ENCOUNTER — Ambulatory Visit: Payer: Self-pay | Admitting: Internal Medicine

## 2010-12-23 ENCOUNTER — Telehealth: Payer: Self-pay | Admitting: Internal Medicine

## 2010-12-23 NOTE — Telephone Encounter (Signed)
BMP prior to visit, ICD-9:  401.9, HbgA1C prior to visit, ICD-9:  250.02  Urine Microalbumin prior to visit, ICD-9:  250.2 Call placed to patient at [phone], no answer. A detailed voice message was left informing patient of fasting blood work needed prior to office visit. Message left informing patient lab order would be entered for [facility] at the [facility]

## 2010-12-23 NOTE — Telephone Encounter (Signed)
DOES SHE NEED LABS PRIOR TO Thursday APPT

## 2010-12-24 ENCOUNTER — Other Ambulatory Visit: Payer: Self-pay | Admitting: Internal Medicine

## 2010-12-24 ENCOUNTER — Other Ambulatory Visit: Payer: Self-pay

## 2010-12-25 LAB — BASIC METABOLIC PANEL
BUN: 18 mg/dL (ref 6–23)
Chloride: 107 mEq/L (ref 96–112)
Creat: 1.07 mg/dL (ref 0.40–1.20)

## 2010-12-25 LAB — HEMOGLOBIN A1C: Mean Plasma Glucose: 174 mg/dL — ABNORMAL HIGH (ref <117)

## 2010-12-25 LAB — MICROALBUMIN / CREATININE URINE RATIO: Creatinine, Urine: 122.6 mg/dL

## 2010-12-26 ENCOUNTER — Ambulatory Visit (INDEPENDENT_AMBULATORY_CARE_PROVIDER_SITE_OTHER): Payer: Medicare Other | Admitting: Internal Medicine

## 2010-12-26 ENCOUNTER — Encounter: Payer: Self-pay | Admitting: Internal Medicine

## 2010-12-26 ENCOUNTER — Telehealth: Payer: Self-pay | Admitting: Internal Medicine

## 2010-12-26 VITALS — BP 130/80 | HR 64 | Temp 97.7°F | Resp 18 | Ht 61.0 in | Wt 227.0 lb

## 2010-12-26 DIAGNOSIS — I10 Essential (primary) hypertension: Secondary | ICD-10-CM

## 2010-12-26 DIAGNOSIS — E785 Hyperlipidemia, unspecified: Secondary | ICD-10-CM

## 2010-12-26 DIAGNOSIS — E119 Type 2 diabetes mellitus without complications: Secondary | ICD-10-CM

## 2010-12-26 MED ORDER — LISINOPRIL 20 MG PO TABS: 20.0000 mg | ORAL_TABLET | Freq: Every day | ORAL | Status: DC

## 2010-12-26 MED ORDER — NATEGLINIDE 120 MG PO TABS: 120.0000 mg | ORAL_TABLET | Freq: Three times a day (TID) | ORAL | Status: DC

## 2010-12-26 MED ORDER — AMLODIPINE BESYLATE 10 MG PO TABS: 10.0000 mg | ORAL_TABLET | Freq: Every day | ORAL | Status: DC

## 2010-12-26 MED ORDER — SITAGLIPTIN PHOS-METFORMIN HCL 50-500 MG PO TABS: 1.0000 | ORAL_TABLET | Freq: Two times a day (BID) | ORAL | Status: DC

## 2010-12-26 MED ORDER — CLOPIDOGREL BISULFATE 75 MG PO TABS: 75.0000 mg | ORAL_TABLET | ORAL | Status: DC

## 2010-12-26 MED ORDER — METOPROLOL TARTRATE 50 MG PO TABS: 50.0000 mg | ORAL_TABLET | Freq: Two times a day (BID) | ORAL | Status: DC

## 2010-12-26 MED ORDER — ATORVASTATIN CALCIUM 40 MG PO TABS: 40.0000 mg | ORAL_TABLET | Freq: Every day | ORAL | Status: DC

## 2010-12-26 NOTE — Telephone Encounter (Signed)
Orders entered and forwarded to the lab. 

## 2010-12-26 NOTE — Progress Notes (Signed)
Subjective:    Patient ID: Elizabeth [name], female    DOB: 05/05/1935, 76 y.o.   MRN: [id number]  HPI Pt had laser tx and vitrectomy of right eye.   Saw cardiologist - Dr. Thomes Lolling.  He requests copy of labs  Ortho - cortisone injections aggravated blood sugar  Review of Systems     Past Medical History  Diagnosis Date  . Allergy   . Diabetes mellitus     type II  . Hypertension   . Myocardial infarction   . LBP (low back pain)   . Diabetic retinopathy     Dr Perley Jain @ [facility] (vitrectomy left eye)  . Tubal pregnancy   . CAD (coronary artery disease)   . History of GI bleed     secondary to heparin    History   Social History  . Marital Status: Married    Spouse Name: N/A    Number of Children: 3  . Years of Education: N/A   Occupational History  . Retired    Social History Main Topics  . Smoking status: Never Smoker   . Smokeless tobacco: Not on file  . Alcohol Use: No  . Drug Use:   . Sexually Active:    Other Topics Concern  . Not on file   Social History Narrative  . No narrative on file    Past Surgical History  Procedure Date  . Cataract extraction   . Appendectomy   . Tonsillectomy   . Laminotomy     Dr Jordan Likes L3-4  and microdiskectomy    Family History  Problem Relation Age of Onset  . Stroke Father   . Coronary artery disease      Allergies  Allergen Reactions  . Cyclobenzaprine Hcl     REACTION: rash  . Sulfonamide Derivatives     Current Outpatient Prescriptions on File Prior to Visit  Medication Sig Dispense Refill  . amLODipine (NORVASC) 10 MG tablet Take 1 tablet (10 mg total) by mouth daily.  30 tablet  1  . aspirin 81 MG tablet Take 81 mg by mouth daily.        Marland Kitchen atorvastatin (LIPITOR) 40 MG tablet Take 40 mg by mouth daily.        . clopidogrel (PLAVIX) 75 MG tablet Take 75 mg by mouth every morning.        Marland Kitchen lisinopril (PRINIVIL,ZESTRIL) 20 MG tablet Take 1 tablet (20 mg total) by mouth daily.  30 tablet  0  . metoprolol  (LOPRESSOR) 50 MG tablet Take 50 mg by mouth 2 (two) times daily.        . nateglinide (STARLIX) 120 MG tablet Take 120 mg by mouth 3 (three) times daily before meals.        . nitroGLYCERIN (NITROSTAT) 0.4 MG SL tablet Place 0.4 mg under the tongue as needed.        . ranitidine (ZANTAC) 150 MG tablet Take 150 mg by mouth 2 (two) times daily. As needed for heartburn.       . sitaGLIPtan-metformin (JANUMET) 50-500 MG per tablet Take 1 tablet by mouth 2 (two) times daily with a meal.  60 tablet  1  . triamcinolone (KENALOG) 0.5 % ointment Apply topically 2 (two) times daily.          BP 130/80  Pulse 64  Temp(Src) 97.7 F (36.5 C) (Oral)  Resp 18  Ht 5\' 1"  (1.549 m)  Wt 227 lb (102.967 kg)  BMI 42.89 kg/m2  SpO2 100%    Objective:   Physical Exam        Assessment & Plan:

## 2010-12-26 NOTE — Patient Instructions (Addendum)
Please complete the following lab tests before your next follow up appointment: FLP, LFTs - 272.4 BMET, A1c - 250.02 Please walk 30 minutes daily Consider using Victoza

## 2010-12-26 NOTE — Telephone Encounter (Signed)
PLEASE SEND LAB ORDER TO [facility] FOR APPT THE FIRST WEEK IN [place]

## 2010-12-31 NOTE — Op Note (Signed)
NAMECUMA, Elizabeth [name] NO.:  [id number]   MEDICAL RECORD NO.:  [id number]          PATIENT TYPE:  INP   LOCATION:  3172                         FACILITY:  [facility]   PHYSICIAN:  Kathaleen Maser. Pool, M.D.    DATE OF BIRTH:  12/16/1934   DATE OF PROCEDURE:  DATE OF DISCHARGE:                               OPERATIVE REPORT   PREOPERATIVE DIAGNOSIS:  Right L3-4 herniated pulposus with  radiculopathy.   POSTOPERATIVE DIAGNOSIS:  Right L3-4 herniated pulposus with  radiculopathy.   PROCEDURE NOTE:  Right L3-4 laminotomy and microdiskectomy.   SURGEON:  Kathaleen Maser. Pool, M.D.   ASSISTANT:  Reinaldo Meeker, M.D.   ANESTHESIA:  General endotracheal.   INDICATIONS:  Elizabeth [name] is a 76 year old female with a history of  severe right lower extremity pain, paresthesias and weakness consistent  with a right-sided L3 radiculopathy.  Workup demonstrates evidence of a  large right-sided L3-4 disk herniation with a superior fragment causing  marked compression of the exiting L3 nerve root.  The patient has been  counseled as to her options.  She decided to proceed with a right-sided  L3-4 laminotomy and microdiskectomy.   OPERATIVE NOTE:  The patient was placed in the operating room, placed on  the operating table in the supine position.  After adequate level of  anesthesia achieved, the patient positioned prone onto Wilson frame and  appropriately padded.  The patient's lumbar region was prepped and  draped sterilely.  A 10 blade was utilized to make a skin incision  overlying the L3-4 interspace.  This was carried down sharply.  A  subperiosteal dissection was performed exposing the lamina and facet  joints of L3 and L4 on the right side.  Deep self-retaining retractor  was placed.  Intraoperative x-ray was taken and level was confirmed.  Laminotomy was then performed using high-speed drill and Kerrison  rongeurs removing the inferior aspect of the lamina of L3, medial aspect  of the L3-4 facet joint and the superior rim of the L4 lamina.  Ligament  flavum was elevated and resected in piecemeal fashion using Kerrison  rongeurs.  The underlying thecal sac was then identified.  Microscope  was brought through the microdissection of the disk herniation and  surrounding nerve roots.  Epidural venous plexus coagulated and cut  thecal sac and L4 nerve root gently mobilized towards the midline.  The  superior fragment was readily identifiable in the axilla of the L3 nerve  root.  This was dissected free using blunt nerve hooks and removed  completely using pituitary rongeurs.  All elements of the free fragment  were completely resected.  A large amount of fragmented disk material  was encountered and completely resected.  At this point a very thorough  clean-out of the fragment had been achieved.  The disk space itself was  quite flat.  There was no significant annular defect.  Given the  patient's advanced age and other multilevel disease, I thought it best  just to perform a fragmentectomy and not enter the disk space formally.  The canal was inspected.  There was  no evidence of injury to the thecal  sac or nerve roots.  There was no evidence of any further compression  upon the thecal sac or nerve roots.  The wound was then irrigated with  antibiotic solution.  Gelfoam was placed topically for hemostasis and  found to be good.  Microscope and retractors  were removed.  Hemostasis in the muscle achieved with electrocautery.  The wounds were closed in layers with Vicryl suture.  Steri-Strips and  sterile dressing were applied.  There were no complications.  She  returns to the recovery room postoperatively.           ______________________________  Kathaleen Maser Pool, M.D.     HAP/MEDQ  D:  07/06/2007  T:  07/07/2007  Job:  [id number]

## 2011-03-27 ENCOUNTER — Ambulatory Visit: Payer: Medicare Other | Admitting: Internal Medicine

## 2011-04-09 ENCOUNTER — Ambulatory Visit: Payer: Medicare Other | Admitting: Internal Medicine

## 2011-04-18 ENCOUNTER — Telehealth: Payer: Self-pay | Admitting: *Deleted

## 2011-04-18 ENCOUNTER — Other Ambulatory Visit: Payer: Self-pay | Admitting: Internal Medicine

## 2011-04-18 DIAGNOSIS — E7800 Pure hypercholesterolemia, unspecified: Secondary | ICD-10-CM

## 2011-04-18 DIAGNOSIS — E119 Type 2 diabetes mellitus without complications: Secondary | ICD-10-CM

## 2011-04-18 NOTE — Telephone Encounter (Signed)
Cbc, chem7, a1c 250.0  Lipid/lft 272.4

## 2011-04-18 NOTE — Telephone Encounter (Signed)
Call was placed to patient at [phone]. She was informed of fasting blood work for [employer].

## 2011-04-18 NOTE — Telephone Encounter (Signed)
Patient called and left voice message stating she will be seeing the provider on September 5th and she is not sure if she needed blood work.

## 2011-04-19 LAB — BASIC METABOLIC PANEL WITH GFR
BUN: 15 mg/dL (ref 6–23)
CO2: 26 mEq/L (ref 19–32)
Calcium: 8.6 mg/dL (ref 8.4–10.5)
GFR, Est African American: >60 mL/min (ref >60)
Glucose, Bld: 158 mg/dL — ABNORMAL HIGH (ref 70–99)
Potassium: 4.7 mEq/L (ref 3.5–5.3)

## 2011-04-19 LAB — LIPID PANEL
LDL Cholesterol: 38 mg/dL (ref 0–99)
Total CHOL/HDL Ratio: 2.3 Ratio
VLDL: 16 mg/dL (ref 0–40)

## 2011-04-19 LAB — HEPATIC FUNCTION PANEL
ALT: 14 U/L (ref 0–35)
AST: 16 U/L (ref 0–37)
Indirect Bilirubin: 0.4 mg/dL (ref 0.0–0.9)
Total Protein: 5.5 g/dL — ABNORMAL LOW (ref 6.0–8.3)

## 2011-04-19 LAB — HEMOGLOBIN A1C
Hgb A1c MFr Bld: 8.2 % — ABNORMAL HIGH (ref <5.7)
Mean Plasma Glucose: 189 mg/dL — ABNORMAL HIGH (ref <117)

## 2011-04-23 ENCOUNTER — Telehealth: Payer: Self-pay | Admitting: Internal Medicine

## 2011-04-23 ENCOUNTER — Encounter: Payer: Self-pay | Admitting: Internal Medicine

## 2011-04-23 ENCOUNTER — Ambulatory Visit (INDEPENDENT_AMBULATORY_CARE_PROVIDER_SITE_OTHER): Payer: Medicare Other | Admitting: Internal Medicine

## 2011-04-23 DIAGNOSIS — R112 Nausea with vomiting, unspecified: Secondary | ICD-10-CM | POA: Insufficient documentation

## 2011-04-23 DIAGNOSIS — E119 Type 2 diabetes mellitus without complications: Secondary | ICD-10-CM

## 2011-04-23 MED ORDER — GLIMEPIRIDE 2 MG PO TABS: 2.0000 mg | ORAL_TABLET | ORAL | Status: DC

## 2011-04-23 MED ORDER — ONDANSETRON HCL 4 MG PO TABS: 4.0000 mg | ORAL_TABLET | Freq: Three times a day (TID) | ORAL | Status: DC | PRN

## 2011-04-23 NOTE — Telephone Encounter (Signed)
Patient has follow up appt for 12-4 with hodgin   Needs to have labs week prior   Please fax order to [facility]

## 2011-04-23 NOTE — Progress Notes (Signed)
  Subjective:    Patient ID: Elizabeth [name], female    DOB: Feb 08, 1935, 76 y.o.   MRN: [id number]  HPI Pt presents to clinic for followup of multiple medical problems. Notes 3-4 day h/o n/v/d now improving. Had malaise and chills. Last emesis ~4:30 am. Diarrhea improving and stools firming up. Diabetes continues under suboptimal control. FSBS range from 115-191 without hypoglycemia. Reviewed A1c 8.3. Believes may have had nausea with past higher dosages of metformin. Tolerates statin tx without myalgias or abn lfts. No other complaints.    Past Medical History  Diagnosis Date  . Allergy   . Diabetes mellitus     type II  . Hypertension   . Myocardial infarction   . LBP (low back pain)   . Diabetic retinopathy     Dr Perley Jain @ [facility] (vitrectomy left eye)  . Tubal pregnancy   . CAD (coronary artery disease)   . History of GI bleed     secondary to heparin   Past Surgical History  Procedure Date  . Cataract extraction   . Appendectomy   . Tonsillectomy   . Laminotomy     Dr Jordan Likes L3-4  and microdiskectomy    reports that she has never smoked. She has never used smokeless tobacco. She reports that she does not drink alcohol or use illicit drugs. family history includes Coronary artery disease in an unspecified family member and Stroke in her father. Allergies  Allergen Reactions  . Cyclobenzaprine Hcl     REACTION: rash  . Sulfa Antibiotics     Review of Systems see hpi    Objective:   Physical Exam  Physical Exam  Nursing note and vitals reviewed. Constitutional: Appears well-developed and well-nourished. No distress.  HENT:  Head: Normocephalic and atraumatic.  Right Ear: External ear normal.  Left Ear: External ear normal.  Eyes: Conjunctivae are normal. No scleral icterus.  Neck: Neck supple. Carotid bruit is not present.  Cardiovascular: Normal rate, regular rhythm and normal heart sounds.  Exam reveals no gallop and no friction rub.   No murmur  heard. Pulmonary/Chest: Effort normal and breath sounds normal. No respiratory distress. He has no wheezes. no rales.  Lymphadenopathy:    He has no cervical adenopathy.  Neurological:Alert.  Skin: Skin is warm and dry. Not diaphoretic.  Psychiatric: Has a normal mood and affect.        Assessment & Plan:

## 2011-04-23 NOTE — Assessment & Plan Note (Signed)
Suspect possible viral gastroenteritis. Has shown spontaneous improvement. Provide with zofran prn. Maintain adequate hydration. Followup if no improvement or worsening.

## 2011-04-23 NOTE — Patient Instructions (Signed)
Please schedule cbc, chem7, a1c  250.0 prior to next visit 

## 2011-04-23 NOTE — Telephone Encounter (Signed)
Lab orders have been entered for the last week of November 2012 for [facility].

## 2011-04-23 NOTE — Assessment & Plan Note (Signed)
suboptimal control. Pt wishes to avoid injectable medication currently. Add low dose amaryl and submit fsbs log in 2-3 wks.

## 2011-05-27 LAB — BASIC METABOLIC PANEL
Chloride: 105
Creatinine, Ser: 0.81
GFR calc Af Amer: >60
Potassium: 4.6
Sodium: 139

## 2011-05-27 LAB — CBC
HCT: 39.9
Hemoglobin: 13.5
MCV: 93
RBC: 4.29
WBC: 6.8

## 2011-06-24 ENCOUNTER — Ambulatory Visit (INDEPENDENT_AMBULATORY_CARE_PROVIDER_SITE_OTHER): Payer: Medicare Other | Admitting: Family

## 2011-06-24 DIAGNOSIS — Z23 Encounter for immunization: Secondary | ICD-10-CM

## 2011-07-16 ENCOUNTER — Other Ambulatory Visit: Payer: Self-pay | Admitting: Internal Medicine

## 2011-07-17 NOTE — Telephone Encounter (Signed)
Rx refill sent to pharmacy. 

## 2011-07-22 ENCOUNTER — Ambulatory Visit: Payer: Medicare Other | Admitting: Internal Medicine

## 2011-08-02 LAB — BASIC METABOLIC PANEL WITH GFR
GFR, Est African American: 63 mL/min (ref >60)
GFR, Est Non African American: 55 mL/min — ABNORMAL LOW (ref >60)
Potassium: 4.9 mEq/L (ref 3.5–5.3)
Sodium: 141 mEq/L (ref 135–145)

## 2011-08-02 LAB — LIPID PANEL
Cholesterol: 90 mg/dL (ref 0–200)
HDL: 44 mg/dL (ref >39)
LDL Cholesterol: 30 mg/dL (ref 0–99)
Triglycerides: 81 mg/dL (ref <150)
VLDL: 16 mg/dL (ref 0–40)

## 2011-08-02 LAB — CBC
MCHC: 30.3 g/dL (ref 30.0–36.0)
Platelets: 291 10*3/uL (ref 150–400)
RDW: 13.9 % (ref 11.5–15.5)

## 2011-08-02 LAB — HEPATIC FUNCTION PANEL
Bilirubin, Direct: 0.1 mg/dL (ref 0.0–0.3)
Indirect Bilirubin: 0.2 mg/dL (ref 0.0–0.9)

## 2011-08-05 ENCOUNTER — Ambulatory Visit (INDEPENDENT_AMBULATORY_CARE_PROVIDER_SITE_OTHER): Payer: Medicare Other | Admitting: Internal Medicine

## 2011-08-05 ENCOUNTER — Encounter: Payer: Self-pay | Admitting: Internal Medicine

## 2011-08-05 DIAGNOSIS — I10 Essential (primary) hypertension: Secondary | ICD-10-CM

## 2011-08-05 DIAGNOSIS — L259 Unspecified contact dermatitis, unspecified cause: Secondary | ICD-10-CM

## 2011-08-05 DIAGNOSIS — E119 Type 2 diabetes mellitus without complications: Secondary | ICD-10-CM

## 2011-08-05 MED ORDER — SITAGLIPTIN PHOS-METFORMIN HCL 50-500 MG PO TABS: 1.0000 | ORAL_TABLET | Freq: Two times a day (BID) | ORAL | Status: DC

## 2011-08-05 MED ORDER — TRIAMCINOLONE ACETONIDE 0.5 % EX OINT: TOPICAL_OINTMENT | Freq: Two times a day (BID) | CUTANEOUS | Status: DC

## 2011-08-05 NOTE — Patient Instructions (Signed)
Please schedule chem7, a1c 250.0 prior to next visit 

## 2011-08-05 NOTE — Assessment & Plan Note (Signed)
Improving control. Hold off on resuming amaryl. Did discuss that the sx's she experienced were likely transient and would ultimately resolve with persistently good control of glucose. Resume 1mg  amaryl if fsbs fasting consistently 130 or greater. Focus on appropriate diabetic diet, exercise and wt loss.

## 2011-08-05 NOTE — Assessment & Plan Note (Signed)
rf triamcinolone for prn use.

## 2011-08-05 NOTE — Progress Notes (Signed)
  Subjective:    Patient ID: Elizabeth [name], female    DOB: November 24, 1934, 76 y.o.   MRN: [id number]  HPI Pt presents to clinic for followup of multiple medical problems. Attempted low dose amaryl and noted hypoglycemic sx's with fsbs of 80's to 90's. No true hypoglycemia. Attempted 1/2 dose and then stopped after 1 wk duration. Reviewed a1c improved to 7.0 from 8.2. Notes recent fsbs range 110-140 with avg ~120. Requests rf of steroid cream prn dermatitis and is s/p dermatology evaluation. H/o CAD asx and recent f/u with cardiology. No other complaints.  Past Medical History  Diagnosis Date  . Allergy   . Diabetes mellitus     type II  . Hypertension   . Myocardial infarction   . LBP (low back pain)   . Diabetic retinopathy     Dr Perley Jain @ [facility] (vitrectomy left eye)  . Tubal pregnancy   . CAD (coronary artery disease)   . History of GI bleed     secondary to heparin   Past Surgical History  Procedure Date  . Cataract extraction   . Appendectomy   . Tonsillectomy   . Laminotomy     Dr Jordan Likes L3-4  and microdiskectomy    reports that she has never smoked. She has never used smokeless tobacco. She reports that she does not drink alcohol or use illicit drugs. family history includes Coronary artery disease in an unspecified family member and Stroke in her father. Allergies  Allergen Reactions  . Cyclobenzaprine Hcl     REACTION: rash  . Sulfa Antibiotics       Review of Systems see hpi    Objective:   Physical Exam  Nursing note and vitals reviewed. Constitutional: She appears well-developed and well-nourished. No distress.  HENT:  Head: Normocephalic and atraumatic.  Eyes: Conjunctivae are normal. Right eye exhibits no discharge. Left eye exhibits no discharge. No scleral icterus.  Neck: Neck supple. Carotid bruit is not present.  Cardiovascular: Normal rate, regular rhythm and normal heart sounds.   Pulmonary/Chest: Effort normal and breath sounds normal. No  respiratory distress. She has no wheezes. She has no rales.  Neurological: She is alert.  Skin: Skin is warm and dry. She is not diaphoretic.  Psychiatric: She has a normal mood and affect.          Assessment & Plan:

## 2011-08-05 NOTE — Assessment & Plan Note (Signed)
Normotensive and stable. Continue current regimen. Monitor bp as outpt and followup in clinic as scheduled.  

## 2011-08-15 ENCOUNTER — Ambulatory Visit (INDEPENDENT_AMBULATORY_CARE_PROVIDER_SITE_OTHER): Payer: Medicare Other | Admitting: Family Medicine

## 2011-08-15 ENCOUNTER — Encounter: Payer: Self-pay | Admitting: Family Medicine

## 2011-08-15 VITALS — BP 162/80 | HR 63 | Temp 97.2°F | Ht 61.0 in | Wt 220.0 lb

## 2011-08-15 DIAGNOSIS — J209 Acute bronchitis, unspecified: Secondary | ICD-10-CM

## 2011-08-15 MED ORDER — HYDROCODONE-HOMATROPINE 5-1.5 MG/5ML PO SYRP: ORAL_SOLUTION | ORAL | Status: AC

## 2011-08-15 NOTE — Patient Instructions (Signed)
Buy OTC mucinex DM for daytime cough if needed.

## 2011-08-15 NOTE — Progress Notes (Signed)
OFFICE NOTE  08/15/2011  CC:  Chief Complaint  Patient presents with  . Cough    productive since Sunday, worse at night     HPI: Patient is a 76 y.o. Caucasian female patient of Dr. Rodena Medin who is here for cough. Pt presents complaining of onset of ST 5 days ago.  Also some mild nasal congestion/runny nose, sneezing, and PND cough.  Worst symptoms seems to be the cough.  ST better now.  Fatigued but not achy.  No wheezing or SOB, no fever.  "Coughed all night last night".  Lately the symptoms seem to be improving, EXCEPT the cough.   She has had flu vaccine this season.  Symptoms made worse by lying supine, night time.  Symptoms improved by nothing. Smoker? no Recent sick contact? Not known Muscle or joint aches? no  ROS: no n/v/d or abdominal pain.  No rash.  No neck stiffness.   +Mild fatigue.  +Mild appetite loss.    Pertinent PMH:  DM 2 HTN Hyperlipidemia GERD  Pertinent Meds: Tylenol, norvasc,lipitor,plavix,lisinopril,metoprolol,nateglinide, zofran,zantac,janumet, PE: Blood pressure 162/80, pulse 63, temperature 97.2 F (36.2 C), temperature source Temporal, height 5\' 1"  (1.549 m), weight 220 lb (99.791 kg), SpO2 98.00%. VS: noted--normal. Gen: alert, NAD, NONTOXIC APPEARING. HEENT: eyes without injection, drainage, or swelling.  Ears: EACs clear, TMs with normal light reflex and landmarks.  Nose: Clear rhinorrhea, with some dried, crusty exudate adherent to mildly injected mucosa.  No purulent d/c.  No paranasal sinus TTP.  No facial swelling.  Throat and mouth without focal lesion.  No pharyngial swelling, erythema, or exudate.   Neck: supple, no LAD.   LUNGS: CTA bilat, nonlabored resps.   CV: RRR, no m/r/g. EXT: no c/c/e SKIN: no rash    IMPRESSION AND PLAN: Viral URI/bronchitis. Looks good, is improving (except bothersome cough).  NO sign of RAD. Symptomatic care discussed: hycodan 1-2 tsp q6h prn--night-time mostly. Mucinex DM daytime.    FOLLOW UP:  prn

## 2011-10-16 ENCOUNTER — Telehealth: Payer: Self-pay | Admitting: Internal Medicine

## 2011-10-16 MED ORDER — AMLODIPINE BESYLATE 10 MG PO TABS: 10.0000 mg | ORAL_TABLET | Freq: Every day | ORAL | Status: DC

## 2011-10-16 MED ORDER — LISINOPRIL 20 MG PO TABS: 20.0000 mg | ORAL_TABLET | Freq: Every day | ORAL | Status: DC

## 2011-10-16 NOTE — Telephone Encounter (Signed)
Amlodipine besylate 10 mg qty 90 Lisinopril 20 mg tablet qty 90

## 2011-10-16 NOTE — Telephone Encounter (Signed)
Rx refill sent to pharmacy. 

## 2011-10-27 ENCOUNTER — Telehealth: Payer: Self-pay | Admitting: Internal Medicine

## 2011-10-27 DIAGNOSIS — E119 Type 2 diabetes mellitus without complications: Secondary | ICD-10-CM

## 2011-10-27 MED ORDER — CLOPIDOGREL BISULFATE 75 MG PO TABS: 75.0000 mg | ORAL_TABLET | ORAL | Status: DC

## 2011-10-27 NOTE — Telephone Encounter (Signed)
Refill- clopidogrel 75mg  tab. Take one tablet by mouth every morning. Qty 90 last fill 12.11.12

## 2011-10-27 NOTE — Telephone Encounter (Signed)
Rx refill sent to pharmacy. 

## 2011-11-04 ENCOUNTER — Ambulatory Visit: Payer: Medicare Other | Admitting: Internal Medicine

## 2011-11-07 ENCOUNTER — Telehealth: Payer: Self-pay | Admitting: Internal Medicine

## 2011-11-07 MED ORDER — ATORVASTATIN CALCIUM 40 MG PO TABS: 40.0000 mg | ORAL_TABLET | Freq: Every day | ORAL | Status: DC

## 2011-11-07 NOTE — Telephone Encounter (Signed)
Refill-atorvastatin 40mg  tab. Take one tablet by mouth once a day. Qty 90 last fill 12.18.12

## 2011-11-07 NOTE — Telephone Encounter (Signed)
Rx refill sent to pharmacy. 

## 2011-12-04 ENCOUNTER — Ambulatory Visit: Payer: Medicare Other | Admitting: Internal Medicine

## 2012-01-01 ENCOUNTER — Ambulatory Visit: Payer: Medicare Other | Admitting: Internal Medicine

## 2012-01-14 ENCOUNTER — Other Ambulatory Visit: Payer: Self-pay | Admitting: Internal Medicine

## 2012-01-16 ENCOUNTER — Telehealth: Payer: Self-pay | Admitting: Internal Medicine

## 2012-01-16 MED ORDER — METOPROLOL TARTRATE 50 MG PO TABS: 50.0000 mg | ORAL_TABLET | Freq: Two times a day (BID) | ORAL | Status: DC

## 2012-01-16 NOTE — Telephone Encounter (Signed)
Rx refill sent to pharmacy. 

## 2012-01-20 ENCOUNTER — Other Ambulatory Visit: Payer: Self-pay | Admitting: Internal Medicine

## 2012-01-20 NOTE — Telephone Encounter (Signed)
Rx refill sent to pharmacy. 

## 2012-02-04 ENCOUNTER — Other Ambulatory Visit: Payer: Self-pay | Admitting: Internal Medicine

## 2012-02-04 ENCOUNTER — Ambulatory Visit: Payer: Medicare Other | Admitting: Internal Medicine

## 2012-02-04 NOTE — Telephone Encounter (Signed)
Rx refill sent to pharmacy. Office visit due.

## 2012-02-06 ENCOUNTER — Other Ambulatory Visit: Payer: Self-pay | Admitting: *Deleted

## 2012-02-06 DIAGNOSIS — E119 Type 2 diabetes mellitus without complications: Secondary | ICD-10-CM

## 2012-02-06 LAB — BASIC METABOLIC PANEL
Calcium: 9.1 mg/dL (ref 8.4–10.5)
Glucose, Bld: 120 mg/dL — ABNORMAL HIGH (ref 70–99)
Sodium: 141 mEq/L (ref 135–145)

## 2012-02-06 LAB — CBC
Hemoglobin: 11.3 g/dL — ABNORMAL LOW (ref 12.0–15.0)
MCH: 26.8 pg (ref 26.0–34.0)
MCHC: 32.3 g/dL (ref 30.0–36.0)

## 2012-02-07 LAB — HEMOGLOBIN A1C
Hgb A1c MFr Bld: 7.1 % — ABNORMAL HIGH (ref <5.7)
Mean Plasma Glucose: 157 mg/dL — ABNORMAL HIGH (ref <117)

## 2012-02-11 ENCOUNTER — Encounter: Payer: Self-pay | Admitting: Internal Medicine

## 2012-02-11 ENCOUNTER — Ambulatory Visit (INDEPENDENT_AMBULATORY_CARE_PROVIDER_SITE_OTHER): Payer: Medicare Other | Admitting: Internal Medicine

## 2012-02-11 ENCOUNTER — Telehealth: Payer: Self-pay | Admitting: Internal Medicine

## 2012-02-11 VITALS — BP 122/72 | HR 63 | Temp 97.8°F | Resp 20 | Ht 61.0 in | Wt 221.0 lb

## 2012-02-11 DIAGNOSIS — D649 Anemia, unspecified: Secondary | ICD-10-CM

## 2012-02-11 DIAGNOSIS — E119 Type 2 diabetes mellitus without complications: Secondary | ICD-10-CM

## 2012-02-11 DIAGNOSIS — M810 Age-related osteoporosis without current pathological fracture: Secondary | ICD-10-CM | POA: Insufficient documentation

## 2012-02-11 DIAGNOSIS — D539 Nutritional anemia, unspecified: Secondary | ICD-10-CM

## 2012-02-11 DIAGNOSIS — E785 Hyperlipidemia, unspecified: Secondary | ICD-10-CM

## 2012-02-11 LAB — VITAMIN B12: Vitamin B-12: 217 pg/mL (ref 211–911)

## 2012-02-11 LAB — FERRITIN: Ferritin: 7 ng/mL — ABNORMAL LOW (ref 10–291)

## 2012-02-11 LAB — CBC
Hemoglobin: 11 g/dL — ABNORMAL LOW (ref 12.0–15.0)
MCHC: 31.5 g/dL (ref 30.0–36.0)
RDW: 15.4 % (ref 11.5–15.5)

## 2012-02-11 MED ORDER — ONDANSETRON HCL 4 MG PO TABS: 4.0000 mg | ORAL_TABLET | Freq: Three times a day (TID) | ORAL | Status: AC | PRN

## 2012-02-11 MED ORDER — FREESTYLE LANCETS MISC: Status: DC

## 2012-02-11 MED ORDER — GLUCOSE BLOOD VI STRP: ORAL_STRIP | Status: DC

## 2012-02-11 NOTE — Assessment & Plan Note (Signed)
No gross active bleeding. Obtain cbc, ferritin, and b12. Given hemoccult cards x3 to complete

## 2012-02-11 NOTE — Telephone Encounter (Signed)
Lab orders entered for September 2013.

## 2012-02-11 NOTE — Assessment & Plan Note (Signed)
Schedule bmd 

## 2012-02-11 NOTE — Assessment & Plan Note (Signed)
Take 1/2 amaryl daily. Test strip and lancets.

## 2012-02-11 NOTE — Progress Notes (Signed)
  Subjective:    Patient ID: Elizabeth [name], female    DOB: 1934/12/29, 77 y.o.   MRN: [id number]  HPI Pt presents to clinic for followup of multiple medical problems. States fsbs 120-130's without hypoglycemia. Takes 1/2 amaryl when blood sugar is high. Reviewed mild anemia. No abd pain, blood in stool or melena. Has never undergone bmd.  Past Medical History  Diagnosis Date  . Allergy   . Diabetes mellitus     type II  . Hypertension   . Myocardial infarction   . LBP (low back pain)   . Diabetic retinopathy     Dr Perley Jain @ [facility] (vitrectomy left eye)  . Tubal pregnancy   . CAD (coronary artery disease)   . History of GI bleed     secondary to heparin   Past Surgical History  Procedure Date  . Cataract extraction   . Appendectomy   . Tonsillectomy   . Laminotomy     Dr Jordan Likes L3-4  and microdiskectomy    reports that she has never smoked. She has never used smokeless tobacco. She reports that she does not drink alcohol or use illicit drugs. family history includes Coronary artery disease in an unspecified family member and Stroke in her father. Allergies  Allergen Reactions  . Cyclobenzaprine Hcl     REACTION: rash  . Sulfa Antibiotics       Review of Systems see hpi     Objective:   Physical Exam  Physical Exam  Nursing note and vitals reviewed. Constitutional: Appears well-developed and well-nourished. No distress.  HENT:  Head: Normocephalic and atraumatic.  Right Ear: External ear normal.  Left Ear: External ear normal.  Eyes: Conjunctivae are normal. No scleral icterus.  Neck: Neck supple. Carotid bruit is not present.  Cardiovascular: Normal rate, regular rhythm and normal heart sounds.  Exam reveals no gallop and no friction rub.   No murmur heard. Pulmonary/Chest: Effort normal and breath sounds normal. No respiratory distress. He has no wheezes. no rales.  Lymphadenopathy:    He has no cervical adenopathy.  Neurological:Alert.  Skin: Skin is warm  and dry. Not diaphoretic.  Psychiatric: Has a normal mood and affect.        Assessment & Plan:

## 2012-02-11 NOTE — Patient Instructions (Signed)
Please schedule fasting labs prior to next appointment Cbc-anemia, chem7, a1c, urine microalbumin-250.0 and lipid/lft 272.4 We are in the process of scheduling your bone density

## 2012-02-17 ENCOUNTER — Telehealth: Payer: Self-pay | Admitting: *Deleted

## 2012-02-17 DIAGNOSIS — E119 Type 2 diabetes mellitus without complications: Secondary | ICD-10-CM

## 2012-02-17 DIAGNOSIS — D649 Anemia, unspecified: Secondary | ICD-10-CM

## 2012-02-17 DIAGNOSIS — M810 Age-related osteoporosis without current pathological fracture: Secondary | ICD-10-CM

## 2012-02-17 DIAGNOSIS — E785 Hyperlipidemia, unspecified: Secondary | ICD-10-CM

## 2012-02-17 NOTE — Telephone Encounter (Signed)
Message copied by Regis Bill on Tue Feb 17, 2012  3:15 PM ------      Message from: Edwyna Perfect      Created: Tue Feb 17, 2012 12:46 PM       Iron is low. Begin feso4 325mg  bid. b12 borderline low-take otc b12 qd. Should be returning hemoccult cards given at visit. Needs repeat cbc, ferritin in 3wks dx-anemia

## 2012-02-17 NOTE — Telephone Encounter (Signed)
Notes Recorded by Edwyna Perfect, MD on 02/17/2012 at 12:46 PM Iron is low. Begin feso4 325mg  bid. b12 borderline low-take otc b12 qd. Should be returning hemoccult cards given at visit. Needs repeat cbc, ferritin in 3wks dx-anemia  [facility] w/contact name & number for patient call back regarding MD instructions on lab results w/new medication & repeat labs requested/SLS Rx for Ferrous sulfate [and No Print entry for OTC B12] pending pharmacy verification. Labs entered in EMR w/3-wk expected date [printout for [facility] lab done].

## 2012-02-23 MED ORDER — VITAMIN B-12 1000 MCG PO TABS: 1000.0000 ug | ORAL_TABLET | Freq: Every day | ORAL | Status: AC

## 2012-02-23 MED ORDER — FERROUS SULFATE 325 (65 FE) MG PO TABS: 325.0000 mg | ORAL_TABLET | Freq: Two times a day (BID) | ORAL | Status: DC

## 2012-02-23 NOTE — Telephone Encounter (Signed)
Discuss with patient, ,Rx sent

## 2012-02-25 ENCOUNTER — Other Ambulatory Visit: Payer: Self-pay | Admitting: Internal Medicine

## 2012-03-02 ENCOUNTER — Ambulatory Visit (INDEPENDENT_AMBULATORY_CARE_PROVIDER_SITE_OTHER)
Admission: RE | Admit: 2012-03-02 | Discharge: 2012-03-02 | Disposition: A | Discharge status: Home / Self Care | Payer: Medicare Other | Source: Ambulatory Visit

## 2012-03-02 DIAGNOSIS — M810 Age-related osteoporosis without current pathological fracture: Secondary | ICD-10-CM

## 2012-04-06 ENCOUNTER — Telehealth: Payer: Self-pay | Admitting: *Deleted

## 2012-04-06 MED ORDER — LORATADINE 10 MG PO TABS: 10.0000 mg | ORAL_TABLET | Freq: Every day | ORAL | Status: DC

## 2012-04-06 NOTE — Telephone Encounter (Signed)
LMOM with contact name & number to inform pt of physician advice & instructions/SLS

## 2012-04-06 NOTE — Telephone Encounter (Signed)
She can try claritin 10 mg once daily.  If symptoms worsen or if no improvement in 1 week, she should be seen in office.

## 2012-04-06 NOTE — Telephone Encounter (Signed)
Pt c/o continued nasal congestion w/post nasal drainage & cough x6 wks, Afebrile; pt has been checked by [facility] nurse & cardiology and reports lungs are clear, believed to be Allergies but pt is concerned about what she can take OTC w/other medications & medical issues/SLS Please advise.

## 2012-04-22 ENCOUNTER — Other Ambulatory Visit: Payer: Self-pay | Admitting: Internal Medicine

## 2012-04-27 ENCOUNTER — Telehealth: Payer: Self-pay | Admitting: *Deleted

## 2012-04-27 DIAGNOSIS — M810 Age-related osteoporosis without current pathological fracture: Secondary | ICD-10-CM

## 2012-04-27 MED ORDER — ALENDRONATE SODIUM 70 MG PO TABS: 70.0000 mg | ORAL_TABLET | ORAL | Status: AC

## 2012-04-27 NOTE — Telephone Encounter (Signed)
Patient informed, understood & agree; New Rx to pharmacy, additional lab order placed in EMR/SLS

## 2012-04-27 NOTE — Telephone Encounter (Signed)
Message copied by Regis Bill on Tue Apr 27, 2012 12:03 PM ------      Message from: Edwyna Perfect      Created: Mon Apr 26, 2012  7:51 PM       bmd shows osteoporosis. At risk for fractures. Add vit d to next visit dx-osteoporosis. Recommend begin fosamax 70mg  po qweek

## 2012-04-28 ENCOUNTER — Telehealth: Payer: Self-pay | Admitting: *Deleted

## 2012-04-28 NOTE — Telephone Encounter (Signed)
Caller request VO for continuation of OT [facility], twice weekly x4 wks beginning week of 09.16.13/SLS Per Vo, [facility], Ok for extension of therapy requested; LMOM with contact name & number to inform caller authorization requested is given; please return call to verify patient & requested information/SLS

## 2012-05-11 ENCOUNTER — Ambulatory Visit: Payer: Medicare Other | Admitting: Internal Medicine

## 2012-05-19 ENCOUNTER — Other Ambulatory Visit: Payer: Self-pay | Admitting: Internal Medicine

## 2012-05-19 NOTE — Telephone Encounter (Signed)
Done/SLS 

## 2012-06-01 ENCOUNTER — Ambulatory Visit: Payer: Medicare Other | Admitting: Internal Medicine

## 2012-06-15 ENCOUNTER — Ambulatory Visit: Payer: Medicare Other | Admitting: Internal Medicine

## 2012-06-17 LAB — BASIC METABOLIC PANEL
BUN: 19 mg/dL (ref 6–23)
Creat: 0.95 mg/dL (ref 0.50–1.10)
Glucose, Bld: 142 mg/dL — ABNORMAL HIGH (ref 70–99)
Potassium: 5.5 mEq/L — ABNORMAL HIGH (ref 3.5–5.3)

## 2012-06-17 LAB — LIPID PANEL
Cholesterol: 126 mg/dL (ref 0–200)
HDL: 51 mg/dL (ref >39)
Total CHOL/HDL Ratio: 2.5 Ratio
VLDL: 17 mg/dL (ref 0–40)

## 2012-06-17 LAB — HEPATIC FUNCTION PANEL
ALT: 15 U/L (ref 0–35)
Albumin: 3.6 g/dL (ref 3.5–5.2)
Alkaline Phosphatase: 80 U/L (ref 39–117)
Indirect Bilirubin: 0.2 mg/dL (ref 0.0–0.9)
Total Protein: 5.8 g/dL — ABNORMAL LOW (ref 6.0–8.3)

## 2012-06-17 LAB — HEMOGLOBIN A1C
Hgb A1c MFr Bld: 7.4 % — ABNORMAL HIGH (ref <5.7)
Mean Plasma Glucose: 166 mg/dL — ABNORMAL HIGH (ref <117)

## 2012-06-17 NOTE — Telephone Encounter (Signed)
Lab orders released [from June, July, & September order dates]/SLS

## 2012-06-17 NOTE — Addendum Note (Signed)
Addended by: Regis Bill on: 06/17/2012 10:51 AM   Modules accepted: Orders

## 2012-06-18 LAB — MICROALBUMIN / CREATININE URINE RATIO: Microalb Creat Ratio: 20.1 mg/g (ref 0.0–30.0)

## 2012-06-18 LAB — CBC WITH DIFFERENTIAL/PLATELET
Basophils Absolute: 0 10*3/uL (ref 0.0–0.1)
Eosinophils Absolute: 0.2 10*3/uL (ref 0.0–0.7)
Eosinophils Relative: 3 % (ref 0–5)
MCH: 28.2 pg (ref 26.0–34.0)
MCV: 88.9 fL (ref 78.0–100.0)
Neutrophils Relative %: 67 % (ref 43–77)
Platelets: 284 10*3/uL (ref 150–400)
RDW: 15.3 % (ref 11.5–15.5)
WBC: 7 10*3/uL (ref 4.0–10.5)

## 2012-06-22 ENCOUNTER — Encounter: Payer: Self-pay | Admitting: Internal Medicine

## 2012-06-22 ENCOUNTER — Telehealth: Payer: Self-pay | Admitting: Internal Medicine

## 2012-06-22 ENCOUNTER — Ambulatory Visit (INDEPENDENT_AMBULATORY_CARE_PROVIDER_SITE_OTHER): Payer: Medicare Other | Admitting: Internal Medicine

## 2012-06-22 ENCOUNTER — Ambulatory Visit (HOSPITAL_BASED_OUTPATIENT_CLINIC_OR_DEPARTMENT_OTHER)
Admission: RE | Admit: 2012-06-22 | Discharge: 2012-06-22 | Disposition: A | Discharge status: Home / Self Care | Payer: Medicare Other | Source: Ambulatory Visit | Attending: Internal Medicine | Admitting: Internal Medicine

## 2012-06-22 VITALS — BP 118/72 | HR 64 | Temp 97.8°F | Resp 14 | Wt 223.5 lb

## 2012-06-22 DIAGNOSIS — Z23 Encounter for immunization: Secondary | ICD-10-CM

## 2012-06-22 DIAGNOSIS — R059 Cough, unspecified: Secondary | ICD-10-CM | POA: Insufficient documentation

## 2012-06-22 DIAGNOSIS — E119 Type 2 diabetes mellitus without complications: Secondary | ICD-10-CM

## 2012-06-22 DIAGNOSIS — R05 Cough: Secondary | ICD-10-CM

## 2012-06-22 DIAGNOSIS — D649 Anemia, unspecified: Secondary | ICD-10-CM

## 2012-06-22 MED ORDER — CLOPIDOGREL BISULFATE 75 MG PO TABS: ORAL_TABLET | ORAL | Status: DC

## 2012-06-22 NOTE — Progress Notes (Signed)
  Subjective:    Patient ID: Elizabeth [name], female    DOB: [birth year], 77 y.o.   MRN: [id number]  HPI Pt presents to clinic for followup of multiple medical problems. Notes chronic cough for the past 4 months intermittently productive for clear sputum. No shortness of breath or wheezing. States dietary indiscretion as a family member is currently being treated for metastatic cancer. States has not had time to take care of herself. Previous anemia now resolved. Did not return Hemoccult cards. Denies blood in stool. Ferritin levels reviewed persistently low. Blood pressure reviewed as normotensive.  Past Medical History  Diagnosis Date  . Allergy   . Diabetes mellitus     type II  . Hypertension   . Myocardial infarction   . LBP (low back pain)   . Diabetic retinopathy(362.0)     Dr Perley Jain @ [facility] (vitrectomy left eye)  . Tubal pregnancy   . CAD (coronary artery disease)   . History of GI bleed     secondary to heparin   Past Surgical History  Procedure Date  . Cataract extraction   . Appendectomy   . Tonsillectomy   . Laminotomy     Dr Jordan Likes L3-4  and microdiskectomy    reports that she has never smoked. She has never used smokeless tobacco. She reports that she does not drink alcohol or use illicit drugs. family history includes Coronary artery disease in an unspecified family member and Stroke in her father. Allergies  Allergen Reactions  . Cyclobenzaprine Hcl     REACTION: rash  . Sulfa Antibiotics       Review of Systems see hpi     Objective:   Physical Exam  Physical Exam  Nursing note and vitals reviewed. Constitutional: Appears well-developed and well-nourished. No distress.  HENT:  Head: Normocephalic and atraumatic.  Right Ear: External ear normal.  Left Ear: External ear normal.  Eyes: Conjunctivae are normal. No scleral icterus.  Neck: Neck supple. Carotid bruit is not present.  Cardiovascular: Normal rate, regular rhythm and normal heart sounds.   Exam reveals no gallop and no friction rub.   No murmur heard. Pulmonary/Chest: Effort normal and breath sounds normal. No respiratory distress. He has no wheezes. no rales.  Lymphadenopathy:    He has no cervical adenopathy.  Neurological:Alert.  Skin: Skin is warm and dry. Not diaphoretic.  Psychiatric: Has a normal mood and affect.        Assessment & Plan:

## 2012-06-22 NOTE — Assessment & Plan Note (Signed)
Result but with persistently depressed iron levels despite supplementation. Provide second set of Hemoccult cards x3 to complete.

## 2012-06-22 NOTE — Assessment & Plan Note (Signed)
Obtain chest x ray

## 2012-06-22 NOTE — Patient Instructions (Signed)
Please take over the counter vitamin D 2000 units once a day. Please return the stool cards to the office or through the mail after you have completed all three cards. Schedule non fasting labs prior to next visit: cbc, ferritin-anemia, chem7, a1c-250.00, vitamin d-vitamin d deficiency

## 2012-06-22 NOTE — Assessment & Plan Note (Signed)
Suboptimal control. Admits to dietary indiscretion. Continue current regimen and focus on appropriate diabetic diet. Repeat Chem-7 and A1c with next visit.

## 2012-06-22 NOTE — Telephone Encounter (Signed)
Schedule non fasting labs prior to next visit: cbc, ferritin-anemia, chem7, a1c-250.00, vitamin d-vitamin d deficiency  Patient has an appointment scheduled for February/2014. She will be going to [facility] lab.

## 2012-07-21 ENCOUNTER — Other Ambulatory Visit: Payer: Self-pay | Admitting: Internal Medicine

## 2012-07-21 NOTE — Telephone Encounter (Signed)
Rx[s] to pharmacy/SLS 

## 2012-08-25 ENCOUNTER — Other Ambulatory Visit: Payer: Self-pay | Admitting: Internal Medicine

## 2012-08-25 NOTE — Telephone Encounter (Signed)
Rx to pharmacy/SLS 

## 2012-09-27 ENCOUNTER — Ambulatory Visit: Payer: Medicare Other | Admitting: Family Medicine

## 2012-10-02 ENCOUNTER — Other Ambulatory Visit: Payer: Self-pay | Admitting: Internal Medicine

## 2012-11-02 ENCOUNTER — Other Ambulatory Visit: Payer: Self-pay | Admitting: Internal Medicine

## 2012-11-09 ENCOUNTER — Ambulatory Visit: Payer: Medicare Other | Admitting: Family Medicine

## 2012-12-14 ENCOUNTER — Ambulatory Visit: Payer: Medicare Other | Admitting: Family Medicine

## 2013-01-17 ENCOUNTER — Ambulatory Visit: Payer: Medicare Other | Admitting: Family Medicine

## 2013-02-04 ENCOUNTER — Ambulatory Visit: Payer: Medicare Other | Admitting: Family Medicine

## 2013-02-09 ENCOUNTER — Telehealth: Payer: Self-pay | Admitting: *Deleted

## 2013-02-09 DIAGNOSIS — D649 Anemia, unspecified: Secondary | ICD-10-CM

## 2013-02-09 DIAGNOSIS — E559 Vitamin D deficiency, unspecified: Secondary | ICD-10-CM

## 2013-02-09 DIAGNOSIS — E119 Type 2 diabetes mellitus without complications: Secondary | ICD-10-CM

## 2013-02-09 LAB — CBC WITH DIFFERENTIAL/PLATELET
HCT: 38.3 % (ref 36.0–46.0)
Hemoglobin: 12.3 g/dL (ref 12.0–15.0)
Lymphocytes Relative: 20 % (ref 12–46)
Lymphs Abs: 1.5 10*3/uL (ref 0.7–4.0)
MCHC: 32.1 g/dL (ref 30.0–36.0)
Monocytes Absolute: 0.5 10*3/uL (ref 0.1–1.0)
Monocytes Relative: 7 % (ref 3–12)
Neutro Abs: 5.1 10*3/uL (ref 1.7–7.7)
Neutrophils Relative %: 71 % (ref 43–77)
RBC: 4.43 MIL/uL (ref 3.87–5.11)

## 2013-02-09 NOTE — Telephone Encounter (Signed)
Pt presented to the lab. Orders entered per 06/2012 office note as below:  Schedule non fasting labs prior to next visit: cbc, ferritin-anemia, chem7, a1c-250.00, vitamin d-vitamin d deficiency

## 2013-02-10 ENCOUNTER — Encounter: Payer: Self-pay | Admitting: Family Medicine

## 2013-02-10 ENCOUNTER — Ambulatory Visit (INDEPENDENT_AMBULATORY_CARE_PROVIDER_SITE_OTHER): Payer: Medicare Other | Admitting: Family Medicine

## 2013-02-10 VITALS — BP 98/64 | HR 75 | Temp 97.9°F | Ht 61.0 in | Wt 235.0 lb

## 2013-02-10 DIAGNOSIS — R252 Cramp and spasm: Secondary | ICD-10-CM

## 2013-02-10 DIAGNOSIS — E119 Type 2 diabetes mellitus without complications: Secondary | ICD-10-CM

## 2013-02-10 DIAGNOSIS — R1013 Epigastric pain: Secondary | ICD-10-CM

## 2013-02-10 DIAGNOSIS — D649 Anemia, unspecified: Secondary | ICD-10-CM

## 2013-02-10 DIAGNOSIS — K5289 Other specified noninfective gastroenteritis and colitis: Secondary | ICD-10-CM

## 2013-02-10 DIAGNOSIS — I10 Essential (primary) hypertension: Secondary | ICD-10-CM

## 2013-02-10 DIAGNOSIS — M25551 Pain in right hip: Secondary | ICD-10-CM

## 2013-02-10 DIAGNOSIS — K529 Noninfective gastroenteritis and colitis, unspecified: Secondary | ICD-10-CM

## 2013-02-10 DIAGNOSIS — M25559 Pain in unspecified hip: Secondary | ICD-10-CM

## 2013-02-10 LAB — BASIC METABOLIC PANEL
CO2: 25 mEq/L (ref 19–32)
Calcium: 8.8 mg/dL (ref 8.4–10.5)
Chloride: 105 mEq/L (ref 96–112)
Glucose, Bld: 249 mg/dL — ABNORMAL HIGH (ref 70–99)
Potassium: 5.7 mEq/L — ABNORMAL HIGH (ref 3.5–5.3)
Sodium: 140 mEq/L (ref 135–145)

## 2013-02-10 LAB — FERRITIN: Ferritin: 7 ng/mL — ABNORMAL LOW (ref 10–291)

## 2013-02-10 LAB — VITAMIN D 25 HYDROXY (VIT D DEFICIENCY, FRACTURES): Vit D, 25-Hydroxy: 33 ng/mL (ref 30–89)

## 2013-02-10 MED ORDER — SITAGLIPTIN PHOS-METFORMIN HCL 50-1000 MG PO TABS: 1.0000 | ORAL_TABLET | Freq: Two times a day (BID) | ORAL | Status: DC

## 2013-02-10 NOTE — Patient Instructions (Addendum)
Watch the potassium Hyland's leg cramp medicine can help Salon Pas patches or creams for pain  Labs prior to next visit lipid, renal, cbc, hepatic, tsh, hgba1c, magnesium   DASH Diet The DASH diet stands for "Dietary Approaches to Stop Hypertension." It is a healthy eating plan that has been shown to reduce high blood pressure (hypertension) in as little as 14 days, while also possibly providing other significant health benefits. These other health benefits include reducing the risk of breast cancer after menopause and reducing the risk of type 2 diabetes, heart disease, colon cancer, and stroke. Health benefits also include weight loss and slowing kidney failure in patients with chronic kidney disease.  DIET GUIDELINES  Limit salt (sodium). Your diet should contain less than 1500 mg of sodium daily.  Limit refined or processed carbohydrates. Your diet should include mostly whole grains. Desserts and added sugars should be used sparingly.  Include small amounts of heart-healthy fats. These types of fats include nuts, oils, and tub margarine. Limit saturated and trans fats. These fats have been shown to be harmful in the body. CHOOSING FOODS  The following food groups are based on a 2000 calorie diet. See your Registered Dietitian for individual calorie needs. Grains and Grain Products (6 to 8 servings daily)  Eat More Often: Whole-wheat bread, brown rice, whole-grain or wheat pasta, quinoa, popcorn without added fat or salt (air popped).  Eat Less Often: White bread, white pasta, white rice, cornbread. Vegetables (4 to 5 servings daily)  Eat More Often: Fresh, frozen, and canned vegetables. Vegetables may be raw, steamed, roasted, or grilled with a minimal amount of fat.  Eat Less Often/Avoid: Creamed or fried vegetables. Vegetables in a cheese sauce. Fruit (4 to 5 servings daily)  Eat More Often: All fresh, canned (in natural juice), or frozen fruits. Dried fruits without added sugar.  One hundred percent fruit juice ( cup [237 mL] daily).  Eat Less Often: Dried fruits with added sugar. Canned fruit in light or heavy syrup. [employer], Fish, and Poultry (2 servings or less daily. One serving is 3 to 4 oz [85-114 g]).  Eat More Often: Ninety percent or leaner ground beef, tenderloin, sirloin. Round cuts of beef, chicken breast, [place] breast. All fish. Grill, bake, or broil your meat. Nothing should be fried.  Eat Less Often/Avoid: Fatty cuts of meat, [place], or chicken leg, thigh, or wing. Fried cuts of meat or fish. Dairy (2 to 3 servings)  Eat More Often: Low-fat or fat-free milk, low-fat plain or light yogurt, reduced-fat or part-skim cheese.  Eat Less Often/Avoid: Milk (whole, 2%).Whole milk yogurt. Full-fat cheeses. Nuts, Seeds, and Legumes (4 to 5 servings per week)  Eat More Often: All without added salt.  Eat Less Often/Avoid: Salted nuts and seeds, canned beans with added salt. Fats and Sweets (limited)  Eat More Often: Vegetable oils, tub margarines without trans fats, sugar-free gelatin. Mayonnaise and salad dressings.  Eat Less Often/Avoid: Coconut oils, palm oils, butter, stick margarine, cream, half and half, cookies, candy, pie. FOR MORE INFORMATION The Dash Diet Eating Plan: www.dashdiet.org Document Released: 07/24/2011 Document Revised: 10/27/2011 Document Reviewed: 07/24/2011 [facility] Patient Information 2014 [place], [place].

## 2013-02-13 ENCOUNTER — Encounter: Payer: Self-pay | Admitting: Family Medicine

## 2013-02-13 DIAGNOSIS — M25551 Pain in right hip: Secondary | ICD-10-CM

## 2013-02-13 DIAGNOSIS — R252 Cramp and spasm: Secondary | ICD-10-CM | POA: Insufficient documentation

## 2013-02-13 DIAGNOSIS — K529 Noninfective gastroenteritis and colitis, unspecified: Secondary | ICD-10-CM

## 2013-02-13 HISTORY — DX: Pain in right hip: M25.551

## 2013-02-13 HISTORY — DX: Noninfective gastroenteritis and colitis, unspecified: K52.9

## 2013-02-13 NOTE — Assessment & Plan Note (Signed)
resolved 

## 2013-02-13 NOTE — Assessment & Plan Note (Addendum)
Patient has been under a great deal of stress caring for her terminally ill son in law has recently died. She agrees to try and start eating better again and increasing her activity level. Continue meds, check labs

## 2013-02-13 NOTE — Assessment & Plan Note (Signed)
Well controlled no changes 

## 2013-02-13 NOTE — Assessment & Plan Note (Signed)
Encouraged topical treatments such as Salon Pas and/or Tylenol prn. Encouraged increased exercise as tolerated and modest weight loss

## 2013-02-13 NOTE — Assessment & Plan Note (Signed)
Increase hydration, add magnesium check labs, reprort worsening symptoms

## 2013-02-13 NOTE — Progress Notes (Signed)
Patient ID: Elizabeth [name], female   DOB: 27-May-1935, 78 y.o.   MRN: [id number] Elizabeth [name] [id number] March 06, 1935 02/13/2013      Progress Note-Follow Up  Subjective  Chief Complaint  Chief Complaint  Patient presents with  . Follow-up    HPI  Patient is a 78 year old Caucasian female who is in today for followup. She's not been in in quite a while I she's been home caring for her terminally ill son-in-law. He has recently passed and she is returned to restart her care. She had a lot of trouble with right hip pain recently but denies any falls. Last night she had sudden onset of nausea vomiting and diarrhea did appear to resolve this morning never any fevers or chills. Has had trouble with nausea vomiting in the past but not persistent. No other acute complaints. No chest pain no palpitations, shortness of breath, GI or GU concerns are noted today. Only had partial response from ondansetron during her nausea  Past Medical History  Diagnosis Date  . Allergy   . Diabetes mellitus     type II  . Hypertension   . Myocardial infarction   . LBP (low back pain)   . Diabetic retinopathy     Dr Perley Jain @ [facility] (vitrectomy left eye)  . Tubal pregnancy   . CAD (coronary artery disease)   . History of GI bleed     secondary to heparin  . Right hip pain 02/13/2013  . Gastroenteritis 02/13/2013  . Muscle cramp 02/13/2013    Past Surgical History  Procedure Laterality Date  . Cataract extraction    . Appendectomy    . Tonsillectomy    . Laminotomy      Dr Jordan Likes L3-4  and microdiskectomy    Family History  Problem Relation Age of Onset  . Stroke Father   . Coronary artery disease      History   Social History  . Marital Status: Married    Spouse Name: N/A    Number of Children: 3  . Years of Education: N/A   Occupational History  . Retired    Social History Main Topics  . Smoking status: Never Smoker   . Smokeless tobacco: Never Used  . Alcohol Use: No  . Drug Use:  No  . Sexually Active: Not on file   Other Topics Concern  . Not on file   Social History Narrative  . No narrative on file    Current Outpatient Prescriptions on File Prior to Visit  Medication Sig Dispense Refill  . acetaminophen (TYLENOL) 500 MG tablet Take 500 mg by mouth every 6 (six) hours as needed.        Marland Kitchen alendronate (FOSAMAX) 70 MG tablet Take 1 tablet (70 mg total) by mouth every 7 (seven) days. Take with a full glass of water on an empty stomach.  4 tablet  11  . amLODipine (NORVASC) 10 MG tablet TAKE 1 TABLET (10 MG TOTAL) BY MOUTH DAILY.  90 tablet  1  . aspirin 81 MG tablet Take 81 mg by mouth daily.        Marland Kitchen atorvastatin (LIPITOR) 40 MG tablet TAKE 1 TABLET (40 MG TOTAL) BY MOUTH DAILY.  90 tablet  1  . clopidogrel (PLAVIX) 75 MG tablet TAKE 1 TABLET BY MOUTH EVERY MORNING  30 tablet  6  . ferrous sulfate 325 (65 FE) MG tablet Take 1 tablet (325 mg total) by mouth 2 (two) times daily with a meal.  60 tablet  5  . glucose blood test strip Use as instructed to check blood sugar 3 times per day Diagnoses Code 250.00  300 each  prn  . hydrOXYzine (ATARAX/VISTARIL) 10 MG tablet Take 10 mg by mouth 3 (three) times daily as needed.        . Lancets (FREESTYLE) lancets Use as instructed to check blood sugar 3 times per day Diagnoses Code 250.00  300 each  prn  . lisinopril (PRINIVIL,ZESTRIL) 20 MG tablet TAKE 1 TABLET BY MOUTH DAILY.  90 tablet  0  . loratadine (CLARITIN) 10 MG tablet Take 1 tablet (10 mg total) by mouth daily.  30 tablet  11  . metoprolol (LOPRESSOR) 50 MG tablet TAKE 1 TABLET (50 MG TOTAL) BY MOUTH 2 (TWO) TIMES DAILY.  180 tablet  0  . nateglinide (STARLIX) 120 MG tablet Take 1 tablet (120 mg total) by mouth 3 (three) times daily before meals.  90 tablet  1  . nitroGLYCERIN (NITROSTAT) 0.4 MG SL tablet Place 0.4 mg under the tongue as needed.        . ranitidine (ZANTAC) 150 MG tablet Take 150 mg by mouth 2 (two) times daily. As needed for heartburn.       .  traMADol (ULTRAM) 50 MG tablet Take 50 mg by mouth every 8 (eight) hours as needed. For moderate pain max 3 per day       . triamcinolone ointment (KENALOG) 0.5 % APPLY TOPICALLY 2 TIMES DAILY.  15 g  2  . vitamin B-12 (CYANOCOBALAMIN) 1000 MCG tablet Take 1 tablet (1,000 mcg total) by mouth daily. OTC.       No current facility-administered medications on file prior to visit.    Allergies  Allergen Reactions  . Cyclobenzaprine Hcl     REACTION: rash  . Sulfa Antibiotics     Review of Systems  Review of Systems  Constitutional: Positive for malaise/fatigue. Negative for fever.  HENT: Negative for congestion.   Eyes: Negative for discharge.  Respiratory: Negative for shortness of breath.   Cardiovascular: Negative for chest pain, palpitations and leg swelling.  Gastrointestinal: Positive for nausea, vomiting and diarrhea. Negative for abdominal pain, constipation, blood in stool and melena.  Genitourinary: Negative for dysuria.  Musculoskeletal: Positive for myalgias. Negative for falls.  Skin: Negative for rash.  Neurological: Negative for loss of consciousness and headaches.  Endo/Heme/Allergies: Negative for polydipsia.  Psychiatric/Behavioral: Negative for depression and suicidal ideas. The patient is nervous/anxious. The patient does not have insomnia.     Objective  BP 98/64  Pulse 75  Temp(Src) 97.9 F (36.6 C) (Oral)  Ht 5\' 1"  (1.549 m)  Wt 235 lb 0.6 oz (106.613 kg)  BMI 44.43 kg/m2  SpO2 96%  Physical Exam  Physical Exam  Constitutional: She is oriented to person, place, and time and well-developed, well-nourished, and in no distress. No distress.  HENT:  Head: Normocephalic and atraumatic.  Eyes: Conjunctivae are normal.  Neck: Neck supple. No thyromegaly present.  Cardiovascular: Normal rate and regular rhythm.  Exam reveals no gallop.   No murmur heard. Pulmonary/Chest: Effort normal and breath sounds normal. She has no wheezes.  Abdominal: She  exhibits no distension and no mass.  Musculoskeletal: She exhibits no edema.  Lymphadenopathy:    She has no cervical adenopathy.  Neurological: She is alert and oriented to person, place, and time.  Skin: Skin is warm and dry. No rash noted. She is not diaphoretic.  Psychiatric: Memory, affect and judgment  normal.    Lab Results  Component Value Date   TSH 1.969 05/16/2010   Lab Results  Component Value Date   WBC 7.3 02/09/2013   HGB 12.3 02/09/2013   HCT 38.3 02/09/2013   MCV 86.5 02/09/2013   PLT 295 02/09/2013   Lab Results  Component Value Date   CREATININE 1.13* 02/09/2013   BUN 19 02/09/2013   NA 140 02/09/2013   K 5.7* 02/09/2013   CL 105 02/09/2013   CO2 25 02/09/2013   Lab Results  Component Value Date   ALT 15 06/17/2012   AST 16 06/17/2012   ALKPHOS 80 06/17/2012   BILITOT 0.3 06/17/2012   Lab Results  Component Value Date   CHOL 126 06/17/2012   Lab Results  Component Value Date   HDL 51 06/17/2012   Lab Results  Component Value Date   LDLCALC 58 06/17/2012   Lab Results  Component Value Date   TRIG 85 06/17/2012   Lab Results  Component Value Date   CHOLHDL 2.5 06/17/2012     Assessment & Plan  Right hip pain Encouraged topical treatments such as Salon Pas and/or Tylenol prn. Encouraged increased exercise as tolerated and modest weight loss  Anemia resolved  HYPERTENSION Well controlled no changes  Gastroenteritis Had N/V and diarrhea last night. Ondansetron 4 mg not helpful, can try 8 mg if recurs. Encouraged probiotics and increased hydration  DIABETES MELLITUS, TYPE II Patient has been under a great deal of stress caring for her terminally ill son in law has recently died. She agrees to try and start eating better again and increasing her activity level. Continue meds, check labs  Muscle cramp Increase hydration, add magnesium check labs, reprort worsening symptoms

## 2013-02-13 NOTE — Assessment & Plan Note (Signed)
Had N/V and diarrhea last night. Ondansetron 4 mg not helpful, can try 8 mg if recurs. Encouraged probiotics and increased hydration

## 2013-02-17 NOTE — Addendum Note (Signed)
Addended by: Regis Bill on: 02/17/2013 08:59 AM   Modules accepted: Orders

## 2013-02-18 LAB — RENAL FUNCTION PANEL
BUN: 31 mg/dL — ABNORMAL HIGH (ref 6–23)
Calcium: 9.2 mg/dL (ref 8.4–10.5)
Creat: 1.2 mg/dL — ABNORMAL HIGH (ref 0.50–1.10)
Phosphorus: 3.7 mg/dL (ref 2.3–4.6)
Potassium: 5.6 mEq/L — ABNORMAL HIGH (ref 3.5–5.3)

## 2013-02-22 ENCOUNTER — Telehealth: Payer: Self-pay | Admitting: *Deleted

## 2013-02-22 DIAGNOSIS — E875 Hyperkalemia: Secondary | ICD-10-CM

## 2013-02-22 MED ORDER — FUROSEMIDE 20 MG PO TABS: 10.0000 mg | ORAL_TABLET | Freq: Every day | ORAL | Status: DC

## 2013-02-22 NOTE — Telephone Encounter (Signed)
Message copied by Regis Bill on Tue Feb 22, 2013 12:56 PM ------      Message from: Danise Edge A      Created: Fri Feb 18, 2013  9:48 AM       Notify patient K still up, mild, avoid K in diet and start Furosemide 10 mg daily and recheck renal panel in a week ------

## 2013-02-22 NOTE — Telephone Encounter (Signed)
Patient informed, understood & agreed; Rx to pharmacy & lab order placed/SLS

## 2013-02-23 ENCOUNTER — Other Ambulatory Visit: Payer: Self-pay | Admitting: Internal Medicine

## 2013-02-23 ENCOUNTER — Other Ambulatory Visit: Payer: Self-pay | Admitting: Family Medicine

## 2013-02-23 NOTE — Telephone Encounter (Signed)
Rx request to pharmacy/SLS  

## 2013-02-24 ENCOUNTER — Other Ambulatory Visit: Payer: Self-pay

## 2013-03-31 ENCOUNTER — Other Ambulatory Visit: Payer: Self-pay | Admitting: Internal Medicine

## 2013-05-12 ENCOUNTER — Ambulatory Visit: Payer: BC Managed Care – PPO | Admitting: Family Medicine

## 2013-06-03 ENCOUNTER — Telehealth: Payer: Self-pay | Admitting: *Deleted

## 2013-06-03 DIAGNOSIS — I10 Essential (primary) hypertension: Secondary | ICD-10-CM

## 2013-06-03 DIAGNOSIS — E785 Hyperlipidemia, unspecified: Secondary | ICD-10-CM

## 2013-06-03 DIAGNOSIS — E119 Type 2 diabetes mellitus without complications: Secondary | ICD-10-CM

## 2013-06-03 DIAGNOSIS — R252 Cramp and spasm: Secondary | ICD-10-CM

## 2013-06-03 LAB — HEPATIC FUNCTION PANEL
AST: 13 U/L (ref 0–37)
Bilirubin, Direct: 0.1 mg/dL (ref 0.0–0.3)
Total Bilirubin: 0.4 mg/dL (ref 0.3–1.2)

## 2013-06-03 LAB — HEMOGLOBIN A1C
Hgb A1c MFr Bld: 10.5 % — ABNORMAL HIGH (ref <5.7)
Mean Plasma Glucose: 255 mg/dL — ABNORMAL HIGH (ref <117)

## 2013-06-03 LAB — RENAL FUNCTION PANEL
CO2: 25 mEq/L (ref 19–32)
Chloride: 105 mEq/L (ref 96–112)
Potassium: 4.7 mEq/L (ref 3.5–5.3)
Sodium: 137 mEq/L (ref 135–145)

## 2013-06-03 LAB — LIPID PANEL: Total CHOL/HDL Ratio: 3 Ratio

## 2013-06-03 LAB — CBC
MCV: 87.8 fL (ref 78.0–100.0)
Platelets: 270 10*3/uL (ref 150–400)
RBC: 4.41 MIL/uL (ref 3.87–5.11)
WBC: 6.9 10*3/uL (ref 4.0–10.5)

## 2013-06-03 LAB — MAGNESIUM: Magnesium: 1.8 mg/dL (ref 1.5–2.5)

## 2013-06-03 NOTE — Telephone Encounter (Signed)
Pt presented to the lab. Orders entered per 02/10/2013 office note as below:  Labs prior to next visit lipid, renal, cbc, hepatic, tsh, hgba1c, magnesium

## 2013-06-06 ENCOUNTER — Ambulatory Visit (INDEPENDENT_AMBULATORY_CARE_PROVIDER_SITE_OTHER): Payer: Medicare Other | Admitting: Family Medicine

## 2013-06-06 ENCOUNTER — Encounter: Payer: Self-pay | Admitting: Family Medicine

## 2013-06-06 VITALS — BP 138/84 | HR 67 | Temp 98.6°F | Wt 234.0 lb

## 2013-06-06 DIAGNOSIS — E119 Type 2 diabetes mellitus without complications: Secondary | ICD-10-CM

## 2013-06-06 DIAGNOSIS — I10 Essential (primary) hypertension: Secondary | ICD-10-CM

## 2013-06-06 DIAGNOSIS — R252 Cramp and spasm: Secondary | ICD-10-CM

## 2013-06-06 DIAGNOSIS — Z23 Encounter for immunization: Secondary | ICD-10-CM

## 2013-06-06 DIAGNOSIS — D649 Anemia, unspecified: Secondary | ICD-10-CM

## 2013-06-06 MED ORDER — GLIMEPIRIDE 2 MG PO TABS: 2.0000 mg | ORAL_TABLET | Freq: Every day | ORAL | Status: DC

## 2013-06-06 NOTE — Progress Notes (Signed)
Patient ID: Elizabeth [name], female   DOB: 07/31/1935, 78 y.o.   MRN: [id number] Elizabeth [name] [id number] 12-Jul-1935 06/06/2013      Progress Note-Follow Up  Subjective  Chief Complaint  Chief Complaint  Patient presents with  . Follow-up    HPI  Patient is a 78 year old Caucasian female who is in today for followup. She generally feeling well. She reports her last visit she was having a lot of muscle cramps but that they have been much better since then until an episode last night. She denies any other concerns. No recent illness. No headaches, chest pain, palpitations, shortness of breath, GI or GU concerns. Denies polyuria or polydipsia.  Past Medical History  Diagnosis Date  . Allergy   . Diabetes mellitus     type II  . Hypertension   . Myocardial infarction   . LBP (low back pain)   . Diabetic retinopathy     Dr Perley Jain @ [facility] (vitrectomy left eye)  . Tubal pregnancy   . CAD (coronary artery disease)   . History of GI bleed     secondary to heparin  . Right hip pain 02/13/2013  . Gastroenteritis 02/13/2013  . Muscle cramp 02/13/2013    Past Surgical History  Procedure Laterality Date  . Cataract extraction    . Appendectomy    . Tonsillectomy    . Laminotomy      Dr Jordan Likes L3-4  and microdiskectomy    Family History  Problem Relation Age of Onset  . Stroke Father   . Coronary artery disease      History   Social History  . Marital Status: Married    Spouse Name: N/A    Number of Children: 3  . Years of Education: N/A   Occupational History  . Retired    Social History Main Topics  . Smoking status: Never Smoker   . Smokeless tobacco: Never Used  . Alcohol Use: No  . Drug Use: No  . Sexual Activity: Not on file   Other Topics Concern  . Not on file   Social History Narrative  . No narrative on file    Current Outpatient Prescriptions on File Prior to Visit  Medication Sig Dispense Refill  . acetaminophen (TYLENOL) 500 MG tablet Take  500 mg by mouth every 6 (six) hours as needed.        Marland Kitchen amLODipine (NORVASC) 10 MG tablet Take 1 tablet (10 mg total) by mouth daily.  90 tablet  1  . aspirin 81 MG tablet Take 81 mg by mouth daily.        Marland Kitchen atorvastatin (LIPITOR) 40 MG tablet TAKE 1 TABLET (40 MG TOTAL) BY MOUTH DAILY.  90 tablet  1  . clopidogrel (PLAVIX) 75 MG tablet TAKE 1 TABLET BY MOUTH EVERY MORNING  30 tablet  3  . furosemide (LASIX) 20 MG tablet Take 0.5 tablets (10 mg total) by mouth daily.  30 tablet  1  . glucose blood test strip Use as instructed to check blood sugar 3 times per day Diagnoses Code 250.00  300 each  prn  . hydrOXYzine (ATARAX/VISTARIL) 10 MG tablet Take 10 mg by mouth 3 (three) times daily as needed.        . Lancets (FREESTYLE) lancets Use as instructed to check blood sugar 3 times per day Diagnoses Code 250.00  300 each  prn  . lisinopril (PRINIVIL,ZESTRIL) 20 MG tablet TAKE 1 TABLET BY MOUTH DAILY.  90 tablet  1  . metoprolol (LOPRESSOR) 50 MG tablet TAKE 1 TABLET BY MOUTH TWICE DAILY  180 tablet  0  . nitroGLYCERIN (NITROSTAT) 0.4 MG SL tablet Place 0.4 mg under the tongue as needed.        . ranitidine (ZANTAC) 150 MG tablet Take 150 mg by mouth 2 (two) times daily. As needed for heartburn.       . sitaGLIPtan-metformin (JANUMET) 50-1000 MG per tablet Take 1 tablet by mouth 2 (two) times daily with a meal.  60 tablet  3  . traMADol (ULTRAM) 50 MG tablet Take 50 mg by mouth every 8 (eight) hours as needed. For moderate pain max 3 per day       . triamcinolone ointment (KENALOG) 0.5 % APPLY TOPICALLY 2 TIMES DAILY.  15 g  2  . ferrous sulfate 325 (65 FE) MG tablet Take 1 tablet (325 mg total) by mouth 2 (two) times daily with a meal.  60 tablet  5  . glimepiride (AMARYL) 2 MG tablet Take 1 tablet (2 mg total) by mouth every morning.  30 tablet  6  . loratadine (CLARITIN) 10 MG tablet Take 1 tablet (10 mg total) by mouth daily.  30 tablet  11   No current facility-administered medications on file  prior to visit.    Allergies  Allergen Reactions  . Cyclobenzaprine Hcl     REACTION: rash  . Sulfa Antibiotics     Review of Systems  Review of Systems  Constitutional: Negative for fever and malaise/fatigue.  HENT: Negative for congestion.   Eyes: Negative for discharge.  Respiratory: Negative for shortness of breath.   Cardiovascular: Negative for chest pain, palpitations and leg swelling.  Gastrointestinal: Negative for nausea, abdominal pain and diarrhea.  Genitourinary: Negative for dysuria.  Musculoskeletal: Positive for myalgias. Negative for falls.  Skin: Negative for rash.  Neurological: Negative for loss of consciousness and headaches.  Endo/Heme/Allergies: Negative for polydipsia.  Psychiatric/Behavioral: Negative for depression and suicidal ideas. The patient is not nervous/anxious and does not have insomnia.     Objective  BP 138/84  Pulse 67  Temp(Src) 98.6 F (37 C) (Oral)  Wt 234 lb (106.142 kg)  BMI 44.24 kg/m2  SpO2 96%  Physical Exam  Physical Exam  Constitutional: She is oriented to person, place, and time and well-developed, well-nourished, and in no distress. No distress.  HENT:  Head: Normocephalic and atraumatic.  Eyes: Conjunctivae are normal.  Neck: Neck supple. No thyromegaly present.  Cardiovascular: Normal rate, regular rhythm and normal heart sounds.   No murmur heard. Pulmonary/Chest: Effort normal and breath sounds normal. She has no wheezes.  Abdominal: She exhibits no distension and no mass.  Musculoskeletal: She exhibits no edema.  Lymphadenopathy:    She has no cervical adenopathy.  Neurological: She is alert and oriented to person, place, and time.  Skin: Skin is warm and dry. No rash noted. She is not diaphoretic.  Psychiatric: Memory, affect and judgment normal.    Lab Results  Component Value Date   TSH 2.564 06/03/2013   Lab Results  Component Value Date   WBC 6.9 06/03/2013   HGB 12.8 06/03/2013   HCT 38.7  06/03/2013   MCV 87.8 06/03/2013   PLT 270 06/03/2013   Lab Results  Component Value Date   CREATININE 1.21* 06/03/2013   BUN 20 06/03/2013   NA 137 06/03/2013   K 4.7 06/03/2013   CL 105 06/03/2013   CO2 25 06/03/2013   Lab Results  Component Value Date   ALT 20 06/03/2013   AST 13 06/03/2013   ALKPHOS 63 06/03/2013   BILITOT 0.4 06/03/2013   Lab Results  Component Value Date   CHOL 127 06/03/2013   Lab Results  Component Value Date   HDL 42 06/03/2013   Lab Results  Component Value Date   LDLCALC 57 06/03/2013   Lab Results  Component Value Date   TRIG 141 06/03/2013   Lab Results  Component Value Date   CHOLHDL 3.0 06/03/2013     Assessment & Plan HYPERTENSION Adequately controlled, no changes to meds.  DIABETES MELLITUS, TYPE II Poorly controlled will restart Amaryl, minimize simple carbs and continue Janumet  Anemia Resolved, will continue to monitor  Muscle cramp Improved since last visit, although she did have an episode last night. Encouraged to hydrate adeuately

## 2013-06-06 NOTE — Patient Instructions (Addendum)
Try adding Magnesium/calcium/zinc daily or Hyland's nighttime leg cramp med, hydrate  Leg Cramps Leg cramps that occur during exercise can be caused by poor circulation or dehydration. However, muscle cramps that occur at rest or during the night are usually not due to any serious medical problem. Heat cramps may cause muscle spasms during hot weather.  CAUSES There is no clear cause for muscle cramps. However, dehydration may be a factor for those who do not drink enough fluids and those who exercise in the heat. Imbalances in the level of sodium, potassium, calcium or magnesium in the muscle tissue may also be a factor. Some medications, such as water pills (diuretics), may cause loss of chemicals that the body needs (like sodium and potassium) and cause muscle cramps. TREATMENT   Make sure your diet has enough fluids and essential minerals for the muscle to work normally.  Avoid strenuous exercise for several days if you have been having frequent leg cramps.  Stretch and massage the cramped muscle for several minutes.  Some medicines may be helpful in some patients with night cramps. Only take over-the-counter or prescription medicines as directed by your caregiver. SEEK IMMEDIATE MEDICAL CARE IF:   Your leg cramps become worse.  Your foot becomes cold, numb, or blue. Document Released: 09/11/2004 Document Revised: 10/27/2011 Document Reviewed: 08/29/2008 [facility] Patient Information 2014 [place], [place].  Basic Carbohydrate Counting Basic carbohydrate counting is a way to plan meals. It is done by counting the amount of carbohydrate in foods. Foods that have carbohydrates are starches (grains, beans, starchy vegetables) and sweets. Eating carbohydrates increases blood glucose (sugar) levels. People with diabetes use carbohydrate counting to help keep their blood glucose at a normal level.  COUNTING CARBOHYDRATES IN FOODS The first step in counting carbohydrates is to learn how many  carbohydrate servings you should have in every meal. A dietitian can plan this for you. After learning the amount of carbohydrates to include in your meal plan, you can start to choose the carbohydrate-containing foods you want to eat.  There are 2 ways to identify the amount of carbohydrates in the foods you eat.  Read the Nutrition Facts panel on food labels. You need 2 pieces of information from the Nutrition Facts panel to count carbohydrates this way:  Serving size.  Total carbohydrate (in grams). Decide how many servings you will be eating. If it is 1 serving, you will be eating the amount of carbohydrate listed on the panel. If you will be eating 2 servings, you will be eating double the amount of carbohydrate listed on the panel.   Learn serving sizes. A serving size of most carbohydrate-containing foods is about 15 grams (g). Listed below are single serving sizes of common carbohydrate-containing foods:  1 slice bread.   cup unsweetened, dry cereal.   cup hot cereal.   cup rice.   cup mashed potatoes.   cup pasta.  1 cup fresh fruit.   cup canned fruit.  1 cup milk (whole, 2%, or skim).   cup starchy vegetables (peas, corn, or potatoes). Counting carbohydrates this way is similar to looking on the Nutrition Facts panel. Decide how many servings you will eat first. Multiply the number of servings you eat by 15 g. For example, if you have 2 cups of strawberries, you had 2 servings. That means you had 30 g of carbohydrate (2 servings x 15 g = 30 g). CALCULATING CARBOHYDRATES IN A MEAL Sample dinner  3 oz chicken breast.   cup brown  rice.   cup corn.  1 cup fat-free milk.  1 cup strawberries with sugar-free whipped topping. Carbohydrate calculation First, identify the foods that contain carbohydrate:  Rice.  Corn.  Milk.  Strawberries. Calculate the number of servings eaten:  2 servings rice.  1 serving corn.  1 serving milk.  1 serving  strawberries. Multiply the number of servings by 15 g:  2 servings rice x 15 g = 30 g.  1 serving corn x 15 g = 15 g.  1 serving milk x 15 g = 15 g.  1 serving strawberries x 15 g = 15 g. Add the amounts to find the total carbohydrates eaten: 30 g + 15 g + 15 g + 15 g = 75 g carbohydrate eaten at dinner. Document Released: 08/04/2005 Document Revised: 10/27/2011 Document Reviewed: 06/20/2011 [facility] Patient Information 2014 [place], [place].

## 2013-06-08 NOTE — Assessment & Plan Note (Signed)
Adequately controlled, no changes to meds.

## 2013-06-08 NOTE — Assessment & Plan Note (Signed)
Poorly controlled will restart Amaryl, minimize simple carbs and continue Janumet

## 2013-06-08 NOTE — Assessment & Plan Note (Signed)
Improved since last visit, although she did have an episode last night. Encouraged to hydrate adeuately

## 2013-06-08 NOTE — Assessment & Plan Note (Signed)
Resolved, will continue to monitor 

## 2013-06-20 ENCOUNTER — Telehealth: Payer: Self-pay

## 2013-06-20 NOTE — Telephone Encounter (Signed)
Patient called requesting a copy of her lab results. Lab results printed and put at front desk. Pt states she will be by here later to get them

## 2013-07-15 ENCOUNTER — Other Ambulatory Visit: Payer: Self-pay | Admitting: Internal Medicine

## 2013-07-15 NOTE — Telephone Encounter (Signed)
Atorvastatin Rx request to pharmacy; Ondansetron Denied, not on active med list, last px 2012; refill not appropriate/SLS

## 2013-07-21 ENCOUNTER — Other Ambulatory Visit: Payer: Self-pay | Admitting: Family Medicine

## 2013-08-16 ENCOUNTER — Telehealth: Payer: Self-pay | Admitting: Family Medicine

## 2013-08-16 MED ORDER — ONDANSETRON HCL 4 MG PO TABS: 4.0000 mg | ORAL_TABLET | Freq: Three times a day (TID) | ORAL | Status: DC | PRN

## 2013-08-16 NOTE — Telephone Encounter (Signed)
Patient has bouts of nausea.  Dr Rodena Medin had given her an rx for ondansetron 4mg .  She called her pharmacy for a refill and it was denied.  Why

## 2013-08-16 NOTE — Telephone Encounter (Signed)
It was in her med list expired 6/14 OK to refill ONdansetron 4 mg tab 1 tab po tid prn nausea, disp #30 with 3 rf

## 2013-08-16 NOTE — Telephone Encounter (Signed)
Please advise?  Was denied per Jasmine December: Ondansetron Denied, not on active med list, last px 2012; refill not appropriate/SLS

## 2013-09-06 ENCOUNTER — Ambulatory Visit: Payer: BC Managed Care – PPO | Admitting: Family Medicine

## 2013-10-06 ENCOUNTER — Ambulatory Visit: Payer: BC Managed Care – PPO | Admitting: Family Medicine

## 2013-10-27 ENCOUNTER — Ambulatory Visit: Payer: BC Managed Care – PPO | Admitting: Family Medicine

## 2013-11-21 ENCOUNTER — Ambulatory Visit: Payer: Medicare Other | Admitting: Family Medicine

## 2014-10-20 ENCOUNTER — Telehealth: Payer: Self-pay | Admitting: Family Medicine

## 2014-10-20 NOTE — Telephone Encounter (Signed)
Ok with me 

## 2014-10-20 NOTE — Telephone Encounter (Signed)
Pt would like to re-est with dr Artist Paisyoo. Can I sch?

## 2014-10-22 NOTE — Telephone Encounter (Signed)
Fine with me

## 2014-10-24 NOTE — Telephone Encounter (Signed)
Pt has been sch

## 2014-11-15 ENCOUNTER — Telehealth: Payer: Self-pay | Admitting: Internal Medicine

## 2014-11-15 ENCOUNTER — Encounter: Payer: Self-pay | Admitting: Internal Medicine

## 2014-11-15 ENCOUNTER — Ambulatory Visit (INDEPENDENT_AMBULATORY_CARE_PROVIDER_SITE_OTHER): Payer: Medicare Other | Admitting: Internal Medicine

## 2014-11-15 VITALS — BP 180/90 | HR 70 | Temp 98.5°F | Ht 60.0 in | Wt 229.0 lb

## 2014-11-15 DIAGNOSIS — E118 Type 2 diabetes mellitus with unspecified complications: Secondary | ICD-10-CM

## 2014-11-15 DIAGNOSIS — I10 Essential (primary) hypertension: Secondary | ICD-10-CM

## 2014-11-15 DIAGNOSIS — E785 Hyperlipidemia, unspecified: Secondary | ICD-10-CM | POA: Diagnosis not present

## 2014-11-15 DIAGNOSIS — Z79899 Other long term (current) drug therapy: Secondary | ICD-10-CM

## 2014-11-15 DIAGNOSIS — E11319 Type 2 diabetes mellitus with unspecified diabetic retinopathy without macular edema: Secondary | ICD-10-CM

## 2014-11-15 DIAGNOSIS — I2510 Atherosclerotic heart disease of native coronary artery without angina pectoris: Secondary | ICD-10-CM

## 2014-11-15 DIAGNOSIS — E1165 Type 2 diabetes mellitus with hyperglycemia: Secondary | ICD-10-CM | POA: Diagnosis not present

## 2014-11-15 DIAGNOSIS — E0842 Diabetes mellitus due to underlying condition with diabetic polyneuropathy: Secondary | ICD-10-CM

## 2014-11-15 DIAGNOSIS — G629 Polyneuropathy, unspecified: Secondary | ICD-10-CM | POA: Diagnosis not present

## 2014-11-15 DIAGNOSIS — IMO0002 Reserved for concepts with insufficient information to code with codable children: Secondary | ICD-10-CM

## 2014-11-15 LAB — CBC WITH DIFFERENTIAL/PLATELET
BASOS ABS: 0 10*3/uL (ref 0.0–0.1)
Basophils Relative: 0.3 % (ref 0.0–3.0)
Eosinophils Absolute: 0.1 10*3/uL (ref 0.0–0.7)
Eosinophils Relative: 1.4 % (ref 0.0–5.0)
HCT: 45.2 % (ref 36.0–46.0)
HEMOGLOBIN: 15 g/dL (ref 12.0–15.0)
LYMPHS ABS: 1.7 10*3/uL (ref 0.7–4.0)
LYMPHS PCT: 17.8 % (ref 12.0–46.0)
MCHC: 33.2 g/dL (ref 30.0–36.0)
MCV: 90.7 fl (ref 78.0–100.0)
Monocytes Absolute: 0.5 10*3/uL (ref 0.1–1.0)
Monocytes Relative: 5.5 % (ref 3.0–12.0)
NEUTROS ABS: 7.3 10*3/uL (ref 1.4–7.7)
Neutrophils Relative %: 75 % (ref 43.0–77.0)
Platelets: 247 10*3/uL (ref 150.0–400.0)
RBC: 4.99 Mil/uL (ref 3.87–5.11)
RDW: 14 % (ref 11.5–15.5)
WBC: 9.7 10*3/uL (ref 4.0–10.5)

## 2014-11-15 LAB — MICROALBUMIN / CREATININE URINE RATIO
CREATININE, U: 67.6 mg/dL
MICROALB UR: 5.3 mg/dL — AB (ref 0.0–1.9)
Microalb Creat Ratio: 7.8 mg/g (ref 0.0–30.0)

## 2014-11-15 LAB — LIPID PANEL
CHOLESTEROL: 138 mg/dL (ref 0–200)
HDL: 41.3 mg/dL (ref >39.00)
LDL Cholesterol: 63 mg/dL (ref 0–99)
NonHDL: 96.7
Total CHOL/HDL Ratio: 3
Triglycerides: 167 mg/dL — ABNORMAL HIGH (ref 0.0–149.0)
VLDL: 33.4 mg/dL (ref 0.0–40.0)

## 2014-11-15 LAB — BASIC METABOLIC PANEL
BUN: 25 mg/dL — ABNORMAL HIGH (ref 6–23)
CALCIUM: 9.6 mg/dL (ref 8.4–10.5)
CO2: 32 mEq/L (ref 19–32)
CREATININE: 1.06 mg/dL (ref 0.40–1.20)
Chloride: 101 mEq/L (ref 96–112)
GFR: 53.35 mL/min — AB (ref >60.00)
Glucose, Bld: 267 mg/dL — ABNORMAL HIGH (ref 70–99)
Potassium: 4.5 mEq/L (ref 3.5–5.1)
Sodium: 135 mEq/L (ref 135–145)

## 2014-11-15 LAB — HEPATIC FUNCTION PANEL
ALK PHOS: 74 U/L (ref 39–117)
ALT: 25 U/L (ref 0–35)
AST: 15 U/L (ref 0–37)
Albumin: 3.6 g/dL (ref 3.5–5.2)
BILIRUBIN DIRECT: 0.1 mg/dL (ref 0.0–0.3)
TOTAL PROTEIN: 6.5 g/dL (ref 6.0–8.3)
Total Bilirubin: 0.4 mg/dL (ref 0.2–1.2)

## 2014-11-15 LAB — VITAMIN B12: Vitamin B-12: 918 pg/mL — ABNORMAL HIGH (ref 211–911)

## 2014-11-15 LAB — HEMOGLOBIN A1C: Hgb A1c MFr Bld: 12.6 % — ABNORMAL HIGH (ref 4.6–6.5)

## 2014-11-15 LAB — TSH: TSH: 1.89 u[IU]/mL (ref 0.35–4.50)

## 2014-11-15 MED ORDER — METOPROLOL TARTRATE 50 MG PO TABS: 50.0000 mg | ORAL_TABLET | Freq: Two times a day (BID) | ORAL | Status: DC

## 2014-11-15 MED ORDER — CLOPIDOGREL BISULFATE 75 MG PO TABS: 75.0000 mg | ORAL_TABLET | Freq: Every morning | ORAL | Status: DC

## 2014-11-15 MED ORDER — AMLODIPINE BESYLATE 10 MG PO TABS: 10.0000 mg | ORAL_TABLET | Freq: Every day | ORAL | Status: DC

## 2014-11-15 MED ORDER — METFORMIN HCL ER (OSM) 500 MG PO TB24: 500.0000 mg | ORAL_TABLET | Freq: Two times a day (BID) | ORAL | Status: DC

## 2014-11-15 MED ORDER — LISINOPRIL 20 MG PO TABS: 20.0000 mg | ORAL_TABLET | Freq: Every day | ORAL | Status: DC

## 2014-11-15 MED ORDER — ATORVASTATIN CALCIUM 40 MG PO TABS: 40.0000 mg | ORAL_TABLET | Freq: Every day | ORAL | Status: DC

## 2014-11-15 NOTE — Progress Notes (Signed)
Pre visit review using our clinic review tool, if applicable. No additional management support is needed unless otherwise documented below in the visit note. 

## 2014-11-15 NOTE — Telephone Encounter (Signed)
emmi mailed  °

## 2014-11-15 NOTE — Assessment & Plan Note (Addendum)
History of poorly controlled type 2 diabetes. Check A1c today.  Also monitor microalb/cr ratio. Patient could not tolerate higher dose of Janumet. She understands she will likely need insulin therapy. Restart metformin ER 500 mg twice daily.  Lab Results  Component Value Date   HGBA1C 12.6* 11/15/2014   HGBA1C 10.5* 06/03/2013   HGBA1C 10.4* 02/09/2013   Lab Results  Component Value Date   MICROALBUR 5.3* 11/15/2014   LDLCALC 63 11/15/2014   CREATININE 1.06 11/15/2014

## 2014-11-15 NOTE — Assessment & Plan Note (Addendum)
I stressed importance of compliance with plavix and atorvastatin (risk factor mgt).  Check LFTs, Lipid panel and TSH.

## 2014-11-15 NOTE — Progress Notes (Signed)
Subjective:    Patient ID: Elizabeth [name], female    DOB: 20-Jun-1935, 80 y.o.   MRN: [id number]  HPI  80 year old white female with history of poorly controlled type 2 diabetes, hypertension and hyperlipidemia to reestablish.  Patient has not been seen by primary care physician since 2014. Patient reports she ran out of her blood pressure medication several months ago. She monitors her blood pressure intermittently at home. She has noticed elevated readings over the last several weeks.  Patient has not been using her Janumet.  Higher dose caused gastrointestinal upset.  Patient reports 3-6 months ago she suffered bout of transient depression. She reports she is feeling better. Patient denies any marital issues. Her PHQ9 score is 2.  Review of Systems Negative for chest pain. Negative for headache  Past Medical History  Diagnosis Date  . Allergy   . Diabetes mellitus     type II  . Hypertension   . Myocardial infarction   . LBP (low back pain)   . Diabetic retinopathy     [facility]  . Tubal pregnancy   . CAD (coronary artery disease)   . History of GI bleed     secondary to heparin  . Right hip pain 02/13/2013  . Gastroenteritis 02/13/2013  . Muscle cramp 02/13/2013    History   Social History  . Marital Status: Married    Spouse Name: N/A  . Number of Children: 3  . Years of Education: N/A   Occupational History  . Retired    Social History Main Topics  . Smoking status: Never Smoker   . Smokeless tobacco: Never Used  . Alcohol Use: No  . Drug Use: No  . Sexual Activity: Not on file   Other Topics Concern  . Not on file   Social History Narrative    Past Surgical History  Procedure Laterality Date  . Cataract extraction    . Appendectomy    . Tonsillectomy    . Laminotomy      Dr Jordan LikesPool L3-4  and microdiskectomy    Family History  Problem Relation Age of Onset  . Stroke Father   . Coronary artery disease       Allergies  Allergen Reactions  . Cyclobenzaprine Hcl     REACTION: rash  . Sulfa Antibiotics     Current Outpatient Prescriptions on File Prior to Visit  Medication Sig Dispense Refill  . aspirin 81 MG tablet Take 81 mg by mouth daily.      . ferrous sulfate 325 (65 FE) MG tablet Take 1 tablet (325 mg total) by mouth 2 (two) times daily with a meal. 60 tablet 5  . glucose blood test strip Use as instructed to check blood sugar 3 times per day Diagnoses Code 250.00 300 each prn  . hydrOXYzine (ATARAX/VISTARIL) 10 MG tablet Take 10 mg by mouth 3 (three) times daily as needed.      . Lancets (FREESTYLE) lancets Use as instructed to check blood sugar 3 times per day Diagnoses Code 250.00 300 each prn  . loratadine (CLARITIN) 10 MG tablet Take 1 tablet (10 mg total) by mouth daily. 30 tablet 11  . nitroGLYCERIN (NITROSTAT) 0.4 MG SL tablet Place 0.4 mg under the tongue as needed.      . ondansetron (ZOFRAN) 4 MG tablet Take 1 tablet (4 mg total) by mouth 3 (three) times daily as needed for nausea or vomiting. 30 tablet 3  .  ranitidine (ZANTAC) 150 MG tablet Take 150 mg by mouth 2 (two) times daily. As needed for heartburn.     . traMADol (ULTRAM) 50 MG tablet Take 50 mg by mouth every 8 (eight) hours as needed. For moderate pain max 3 per day      No current facility-administered medications on file prior to visit.    BP 180/90 mmHg  Pulse 70  Temp(Src) 98.5 F (36.9 C) (Oral)  Ht 5' (1.524 m)  Wt 229 lb (103.874 kg)  BMI 44.72 kg/m2      Objective:   Physical Exam  Constitutional: She is oriented to person, place, and time. She appears well-developed and well-nourished. No distress.  HENT:  Head: Normocephalic and atraumatic.  Right Ear: External ear normal.  Left Ear: External ear normal.  Cardiovascular: Normal rate, regular rhythm and normal heart sounds.   No murmur heard. Pulmonary/Chest: Effort normal and breath sounds normal. She has no wheezes.  Musculoskeletal:  She exhibits no edema.  Neurological: She is alert and oriented to person, place, and time. No cranial nerve deficit.  Skin: Skin is warm and dry.  Psychiatric: She has a normal mood and affect. Her behavior is normal.          Assessment & Plan:

## 2014-11-15 NOTE — Assessment & Plan Note (Addendum)
Blood pressure poorly controlled secondary to medication noncompliance. I stressed importance of adequate blood pressure control. Restart all of her medications. Reassess in 2 weeks.  Check BMET.  Goal blood pressure 130/80  BP: (!) 180/90 mmHg

## 2014-11-17 DIAGNOSIS — E1140 Type 2 diabetes mellitus with diabetic neuropathy, unspecified: Secondary | ICD-10-CM | POA: Insufficient documentation

## 2014-11-17 NOTE — Assessment & Plan Note (Signed)
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  Check B12 level.

## 2014-11-29 ENCOUNTER — Ambulatory Visit (INDEPENDENT_AMBULATORY_CARE_PROVIDER_SITE_OTHER): Payer: Medicare Other | Admitting: Internal Medicine

## 2014-11-29 ENCOUNTER — Encounter: Payer: Self-pay | Admitting: Internal Medicine

## 2014-11-29 VITALS — BP 130/82 | HR 74 | Temp 98.7°F | Ht 60.0 in | Wt 223.0 lb

## 2014-11-29 DIAGNOSIS — IMO0002 Reserved for concepts with insufficient information to code with codable children: Secondary | ICD-10-CM

## 2014-11-29 DIAGNOSIS — I10 Essential (primary) hypertension: Secondary | ICD-10-CM

## 2014-11-29 DIAGNOSIS — E11319 Type 2 diabetes mellitus with unspecified diabetic retinopathy without macular edema: Secondary | ICD-10-CM

## 2014-11-29 DIAGNOSIS — J069 Acute upper respiratory infection, unspecified: Secondary | ICD-10-CM | POA: Diagnosis not present

## 2014-11-29 DIAGNOSIS — E1165 Type 2 diabetes mellitus with hyperglycemia: Secondary | ICD-10-CM | POA: Diagnosis not present

## 2014-11-29 MED ORDER — INSULIN PEN NEEDLE 31G X 8 MM MISC: Status: DC

## 2014-11-29 MED ORDER — INSULIN DETEMIR 100 UNIT/ML FLEXPEN: PEN_INJECTOR | SUBCUTANEOUS | Status: DC

## 2014-11-29 NOTE — Patient Instructions (Signed)
Use over the counter mucinex twice daily as needed for cough Gargle with warm salt water and use nasal saline spray 2-3 times daily Increase fluid intake Call our office with your blood sugar readings in 1 week Bring your blood sugar log to your next office visit

## 2014-11-29 NOTE — Progress Notes (Signed)
Pre visit review using our clinic review tool, if applicable. No additional management support is needed unless otherwise documented below in the visit note. 

## 2014-11-29 NOTE — Assessment & Plan Note (Signed)
Blood pressures trending lower. She reports her home blood pressure readings (systolic) in the [id number]. Continue to monitor for now. She may need further adjustment of her antihypertensives. BP: 130/82 mmHg

## 2014-11-29 NOTE — Progress Notes (Signed)
Subjective:    Patient ID: Elizabeth [name], female    DOB: 06-16-1935, 80 y.o.   MRN: [id number]  HPI  80 year old white female with history of type 2 diabetes uncontrolled hypertension and diabetic retinopathy for follow-up. Patient taking her blood pressure medication as directed. Her blood pressure trending lower. Systolic blood pressure readings at home usually in the 140s.  Patient complains of laryngitis, cough and generalized achiness over the last 1 week. Her symptoms gradually improving. She denies shortness of breath. She thinks she has had low-grade fever at home.  Type 2 diabetes uncontrolled-patient's A1c significantly elevated at 12.6. A year ago her A1c was 10.5. Recent random blood sugar 267. Her serum creatinine is normal. She is tolerating metformin ER 500 mg twice daily. She has not checked her blood sugar recently.  Review of Systems Negative for shortness of breath, negative for chest pain    Past Medical History  Diagnosis Date  . Allergy   . Diabetes mellitus     type II  . Hypertension   . Myocardial infarction   . LBP (low back pain)   . Diabetic retinopathy     [facility]  . Tubal pregnancy   . CAD (coronary artery disease)   . History of GI bleed     secondary to heparin  . Right hip pain 02/13/2013  . Gastroenteritis 02/13/2013  . Muscle cramp 02/13/2013    History   Social History  . Marital Status: Married    Spouse Name: N/A  . Number of Children: 3  . Years of Education: N/A   Occupational History  . Retired    Social History Main Topics  . Smoking status: Never Smoker   . Smokeless tobacco: Never Used  . Alcohol Use: No  . Drug Use: No  . Sexual Activity: Not on file   Other Topics Concern  . Not on file   Social History Narrative    Past Surgical History  Procedure Laterality Date  . Cataract extraction    . Appendectomy    . Tonsillectomy    . Laminotomy      Dr Jordan LikesPool L3-4  and  microdiskectomy    Family History  Problem Relation Age of Onset  . Stroke Father   . Coronary artery disease      Allergies  Allergen Reactions  . Cyclobenzaprine Hcl     REACTION: rash  . Sulfa Antibiotics     Current Outpatient Prescriptions on File Prior to Visit  Medication Sig Dispense Refill  . amLODipine (NORVASC) 10 MG tablet Take 1 tablet (10 mg total) by mouth daily. 90 tablet 1  . aspirin 81 MG tablet Take 81 mg by mouth daily.      Marland Kitchen. atorvastatin (LIPITOR) 40 MG tablet Take 1 tablet (40 mg total) by mouth daily. 90 tablet 1  . Cholecalciferol (VITAMIN D) 2000 UNITS CAPS Take 1 capsule by mouth daily.    . clopidogrel (PLAVIX) 75 MG tablet Take 1 tablet (75 mg total) by mouth every morning. 90 tablet 1  . cyanocobalamin 1000 MCG tablet Take 100 mcg by mouth daily.    . ferrous sulfate 325 (65 FE) MG tablet Take 1 tablet (325 mg total) by mouth 2 (two) times daily with a meal. 60 tablet 5  . glucose blood test strip Use as instructed to check blood sugar 3 times per day Diagnoses Code 250.00 300 each prn  . hydrOXYzine (ATARAX/VISTARIL) 10 MG tablet  Take 10 mg by mouth 3 (three) times daily as needed.      . Lancets (FREESTYLE) lancets Use as instructed to check blood sugar 3 times per day Diagnoses Code 250.00 300 each prn  . lisinopril (PRINIVIL,ZESTRIL) 20 MG tablet Take 1 tablet (20 mg total) by mouth daily. 90 tablet 1  . loratadine (CLARITIN) 10 MG tablet Take 1 tablet (10 mg total) by mouth daily. 30 tablet 11  . metformin (FORTAMET) 500 MG (OSM) 24 hr tablet Take 1 tablet (500 mg total) by mouth 2 (two) times daily with a meal. 180 tablet 1  . metoprolol (LOPRESSOR) 50 MG tablet Take 1 tablet (50 mg total) by mouth 2 (two) times daily. 180 tablet 1  . nitroGLYCERIN (NITROSTAT) 0.4 MG SL tablet Place 0.4 mg under the tongue as needed.      . ondansetron (ZOFRAN) 4 MG tablet Take 1 tablet (4 mg total) by mouth 3 (three) times daily as needed for nausea or vomiting.  30 tablet 3  . PATADAY 0.2 % SOLN Place 1 drop into both eyes daily.    . ranitidine (ZANTAC) 150 MG tablet Take 150 mg by mouth 2 (two) times daily. As needed for heartburn.     . traMADol (ULTRAM) 50 MG tablet Take 50 mg by mouth every 8 (eight) hours as needed. For moderate pain max 3 per day      No current facility-administered medications on file prior to visit.    BP 130/82 mmHg  Pulse 74  Temp(Src) 98.7 F (37.1 C) (Oral)  Ht 5' (1.524 m)  Wt 223 lb (101.152 kg)  BMI 43.55 kg/m2    Objective:   Physical Exam        Assessment & Plan:

## 2014-11-29 NOTE — Assessment & Plan Note (Signed)
Patient presents with one-week of laryngitis, cough and generalized achiness. She likely has viral upper respiratory infection. I recommended symptom of treatment.  Patient advised to call office if symptoms persist or worsen.

## 2014-11-29 NOTE — Assessment & Plan Note (Signed)
Her A1c is much worse at 12.6. Continue metformin ER 500 mg twice daily. Start basal insulin-Levemir15 units at bedtime once daily. I stressed importance of checking her fasting blood sugars in the morning. She will call our office in 1 week with blood sugar readings. She will likely need higher dose of basal insulin.  Insulin teaching provided.  Patient instructed to maintain blood sugar log.

## 2014-12-13 ENCOUNTER — Ambulatory Visit: Payer: Medicare Other | Admitting: Internal Medicine

## 2014-12-22 ENCOUNTER — Ambulatory Visit: Payer: Medicare Other | Admitting: Internal Medicine

## 2016-05-18 LAB — HM DIABETES EYE EXAM

## 2016-07-09 ENCOUNTER — Encounter (HOSPITAL_BASED_OUTPATIENT_CLINIC_OR_DEPARTMENT_OTHER): Payer: Self-pay | Admitting: Emergency Medicine

## 2016-07-09 ENCOUNTER — Emergency Department (HOSPITAL_BASED_OUTPATIENT_CLINIC_OR_DEPARTMENT_OTHER)
Admission: EM | Admit: 2016-07-09 | Discharge: 2016-07-09 | Disposition: A | Discharge status: Home / Self Care | Payer: Medicare Other | Attending: Emergency Medicine | Admitting: Emergency Medicine

## 2016-07-09 ENCOUNTER — Emergency Department (HOSPITAL_BASED_OUTPATIENT_CLINIC_OR_DEPARTMENT_OTHER): Payer: Medicare Other

## 2016-07-09 DIAGNOSIS — I2510 Atherosclerotic heart disease of native coronary artery without angina pectoris: Secondary | ICD-10-CM | POA: Diagnosis not present

## 2016-07-09 DIAGNOSIS — I159 Secondary hypertension, unspecified: Secondary | ICD-10-CM | POA: Insufficient documentation

## 2016-07-09 DIAGNOSIS — Z7982 Long term (current) use of aspirin: Secondary | ICD-10-CM | POA: Insufficient documentation

## 2016-07-09 DIAGNOSIS — E119 Type 2 diabetes mellitus without complications: Secondary | ICD-10-CM | POA: Insufficient documentation

## 2016-07-09 DIAGNOSIS — I252 Old myocardial infarction: Secondary | ICD-10-CM | POA: Insufficient documentation

## 2016-07-09 DIAGNOSIS — I10 Essential (primary) hypertension: Secondary | ICD-10-CM | POA: Diagnosis present

## 2016-07-09 DIAGNOSIS — Z7984 Long term (current) use of oral hypoglycemic drugs: Secondary | ICD-10-CM | POA: Insufficient documentation

## 2016-07-09 DIAGNOSIS — Z79899 Other long term (current) drug therapy: Secondary | ICD-10-CM | POA: Diagnosis not present

## 2016-07-09 LAB — CBC WITH DIFFERENTIAL/PLATELET
Basophils Absolute: 0 10*3/uL (ref 0.0–0.1)
Basophils Relative: 0 %
EOS PCT: 1 %
Eosinophils Absolute: 0.1 10*3/uL (ref 0.0–0.7)
HEMATOCRIT: 47.5 % — AB (ref 36.0–46.0)
Hemoglobin: 15.8 g/dL — ABNORMAL HIGH (ref 12.0–15.0)
LYMPHS ABS: 1 10*3/uL (ref 0.7–4.0)
LYMPHS PCT: 14 %
MCH: 30.2 pg (ref 26.0–34.0)
MCHC: 33.3 g/dL (ref 30.0–36.0)
MCV: 90.8 fL (ref 78.0–100.0)
MONO ABS: 0.5 10*3/uL (ref 0.1–1.0)
MONOS PCT: 7 %
NEUTROS ABS: 5.8 10*3/uL (ref 1.7–7.7)
Neutrophils Relative %: 78 %
PLATELETS: 222 10*3/uL (ref 150–400)
RBC: 5.23 MIL/uL — ABNORMAL HIGH (ref 3.87–5.11)
RDW: 13 % (ref 11.5–15.5)
WBC: 7.3 10*3/uL (ref 4.0–10.5)

## 2016-07-09 LAB — BASIC METABOLIC PANEL
ANION GAP: 8 (ref 5–15)
BUN: 18 mg/dL (ref 6–20)
CALCIUM: 9.4 mg/dL (ref 8.9–10.3)
CO2: 27 mmol/L (ref 22–32)
Chloride: 96 mmol/L — ABNORMAL LOW (ref 101–111)
Creatinine, Ser: 0.87 mg/dL (ref 0.44–1.00)
GFR calc Af Amer: >60 mL/min (ref >60)
GLUCOSE: 361 mg/dL — AB (ref 65–99)
Potassium: 4.4 mmol/L (ref 3.5–5.1)
Sodium: 131 mmol/L — ABNORMAL LOW (ref 135–145)

## 2016-07-09 LAB — TROPONIN I: Troponin I: <0.03 ng/mL (ref <0.03)

## 2016-07-09 MED ORDER — LISINOPRIL 10 MG PO TABS
10.0000 mg | ORAL_TABLET | Freq: Once | ORAL | Status: AC
  Administered 2016-07-09: 10 mg via ORAL
  Filled 2016-07-09: qty 1

## 2016-07-09 MED ORDER — CLOPIDOGREL BISULFATE 75 MG PO TABS: 75.0000 mg | ORAL_TABLET | Freq: Every morning | ORAL | 1 refills | Status: DC

## 2016-07-09 MED ORDER — ASPIRIN 81 MG PO TABS: 81.0000 mg | ORAL_TABLET | Freq: Every day | ORAL | 1 refills | Status: DC

## 2016-07-09 MED ORDER — AMLODIPINE BESYLATE 5 MG PO TABS
5.0000 mg | ORAL_TABLET | Freq: Once | ORAL | Status: AC
  Administered 2016-07-09: 5 mg via ORAL
  Filled 2016-07-09: qty 1

## 2016-07-09 MED ORDER — ATORVASTATIN CALCIUM 40 MG PO TABS: 40.0000 mg | ORAL_TABLET | Freq: Every day | ORAL | 1 refills | Status: DC

## 2016-07-09 MED ORDER — METHOCARBAMOL 500 MG PO TABS: 500.0000 mg | ORAL_TABLET | Freq: Two times a day (BID) | ORAL | 0 refills | Status: DC | PRN

## 2016-07-09 MED ORDER — METFORMIN HCL 500 MG PO TABS
1000.0000 mg | ORAL_TABLET | Freq: Once | ORAL | Status: AC
  Administered 2016-07-09: 1000 mg via ORAL
  Filled 2016-07-09: qty 2

## 2016-07-09 MED ORDER — AMLODIPINE BESYLATE 10 MG PO TABS: 10.0000 mg | ORAL_TABLET | Freq: Every day | ORAL | 1 refills | Status: DC

## 2016-07-09 MED ORDER — CYCLOBENZAPRINE HCL 5 MG PO TABS: 5.0000 mg | ORAL_TABLET | Freq: Once | ORAL | Status: DC

## 2016-07-09 MED ORDER — GLUCOSE BLOOD VI STRP: ORAL_STRIP | 99 refills | Status: DC

## 2016-07-09 MED ORDER — METHOCARBAMOL 500 MG PO TABS
500.0000 mg | ORAL_TABLET | Freq: Once | ORAL | Status: AC
  Administered 2016-07-09: 500 mg via ORAL
  Filled 2016-07-09: qty 1

## 2016-07-09 MED ORDER — METOPROLOL TARTRATE 50 MG PO TABS: 25.0000 mg | ORAL_TABLET | Freq: Two times a day (BID) | ORAL | 1 refills | Status: DC

## 2016-07-09 MED ORDER — METOPROLOL TARTRATE 50 MG PO TABS
25.0000 mg | ORAL_TABLET | Freq: Once | ORAL | Status: AC
  Administered 2016-07-09: 25 mg via ORAL
  Filled 2016-07-09: qty 1

## 2016-07-09 MED ORDER — ACETAMINOPHEN 325 MG PO TABS
650.0000 mg | ORAL_TABLET | Freq: Once | ORAL | Status: AC
  Administered 2016-07-09: 650 mg via ORAL
  Filled 2016-07-09: qty 2

## 2016-07-09 MED ORDER — LISINOPRIL 20 MG PO TABS: 10.0000 mg | ORAL_TABLET | Freq: Every day | ORAL | 0 refills | Status: DC

## 2016-07-09 MED ORDER — METFORMIN HCL ER (OSM) 500 MG PO TB24: 500.0000 mg | ORAL_TABLET | Freq: Two times a day (BID) | ORAL | 1 refills | Status: DC

## 2016-07-09 MED ORDER — FREESTYLE LANCETS MISC: 99 refills | Status: DC

## 2016-07-09 MED ORDER — NITROGLYCERIN 0.4 MG SL SUBL: 0.4000 mg | SUBLINGUAL_TABLET | SUBLINGUAL | 0 refills | Status: AC | PRN

## 2016-07-09 NOTE — ED Notes (Signed)
Message left per secretary Vikki PortsValerie for care management to return call

## 2016-07-09 NOTE — ED Provider Notes (Signed)
[facility]-EMERGENCY DEPT Provider Note   CSN: [id number] Arrival date & time: 07/09/16  1024     History   Chief Complaint Chief Complaint  Patient presents with  . Hypertension    HPI Elizabeth [name] is a 81 y.o. female.   Hypertension  This is a new problem. The current episode started more than 1 week ago. The problem occurs constantly. The problem has not changed since onset.Pertinent negatives include no chest pain, no abdominal pain, no headaches and no shortness of breath. Nothing aggravates the symptoms. Nothing relieves the symptoms. She has tried nothing for the symptoms. The treatment provided no relief.    Past Medical History:  Diagnosis Date  . Allergy   . CAD (coronary artery disease)   . Diabetes mellitus    type II  . Diabetic retinopathy    Dr Perley Jain @ [facility] (vitrectomy left eye)  . Gastroenteritis 02/13/2013  . History of GI bleed    secondary to heparin  . Hypertension   . LBP (low back pain)   . Muscle cramp 02/13/2013  . Myocardial infarction   . Right hip pain 02/13/2013  . Tubal pregnancy     Patient Active Problem List   Diagnosis Date Noted  . URI (upper respiratory infection) 11/29/2014  . Diabetic neuropathy (HCC) 11/17/2014  . Right hip pain 02/13/2013  . Gastroenteritis 02/13/2013  . Muscle cramp 02/13/2013  . Chronic cough 06/22/2012  . Postmenopausal bone loss 02/11/2012  . Anemia 02/11/2012  . BACK PAIN, LUMBAR, CHRONIC 06/19/2010  . BACK PAIN, THORACIC REGION 04/15/2010  . Coronary atherosclerosis 09/24/2009  . Type 2 diabetes, uncontrolled, with retinopathy (HCC) 09/20/2009  . Essential hypertension 09/20/2009  . MYOCARDIAL INFARCTION, HX OF 09/20/2009  . ALLERGIC RHINITIS 09/20/2009    Past Surgical History:  Procedure Laterality Date  . APPENDECTOMY    . CATARACT EXTRACTION    . LAMINOTOMY     Dr Jordan Likes L3-4  and microdiskectomy  . TONSILLECTOMY      OB History    No data available       Home Medications     Prior to Admission medications   Medication Sig Start Date End Date Taking? Authorizing Provider  PATADAY 0.2 % SOLN Place 1 drop into both eyes daily. 08/25/14  Yes Historical Provider, MD  amLODipine (NORVASC) 10 MG tablet Take 1 tablet (10 mg total) by mouth daily. 07/09/16   Marily Memos, MD  aspirin 81 MG tablet Take 1 tablet (81 mg total) by mouth daily. 07/09/16   Marily Memos, MD  atorvastatin (LIPITOR) 40 MG tablet Take 1 tablet (40 mg total) by mouth daily. 07/09/16   Marily Memos, MD  clopidogrel (PLAVIX) 75 MG tablet Take 1 tablet (75 mg total) by mouth every morning. 07/09/16   Marily Memos, MD  glucose blood test strip Use as instructed to check blood sugar 3 times per day Diagnoses Code 250.00 07/09/16   Marily Memos, MD  Lancets (FREESTYLE) lancets Use as instructed to check blood sugar 3 times per day Diagnoses Code 250.00 07/09/16   Marily Memos, MD  lisinopril (PRINIVIL,ZESTRIL) 20 MG tablet Take 0.5 tablets (10 mg total) by mouth daily. 07/09/16   Marily Memos, MD  metformin (FORTAMET) 500 MG (OSM) 24 hr tablet Take 1 tablet (500 mg total) by mouth 2 (two) times daily with a meal. 07/09/16   Marily Memos, MD  methocarbamol (ROBAXIN) 500 MG tablet Take 1 tablet (500 mg total) by mouth 2 (two) times daily as needed  for muscle spasms. 07/09/16   Marily MemosJason Chancey Ringel, MD  metoprolol (LOPRESSOR) 50 MG tablet Take 0.5 tablets (25 mg total) by mouth 2 (two) times daily. 07/09/16   Marily MemosJason Jeanne Diefendorf, MD  nitroGLYCERIN (NITROSTAT) 0.4 MG SL tablet Place 1 tablet (0.4 mg total) under the tongue every 5 (five) minutes as needed for chest pain. 07/09/16   Marily MemosJason Gaven Eugene, MD  ondansetron (ZOFRAN) 4 MG tablet Take 1 tablet (4 mg total) by mouth 3 (three) times daily as needed for nausea or vomiting. 08/16/13   Bradd CanaryStacey A Blyth, MD    Family History Family History  Problem Relation Age of Onset  . Stroke Father   . Coronary artery disease      Social History Social History  Substance Use Topics  .  Smoking status: Never Smoker  . Smokeless tobacco: Never Used  . Alcohol use No     Allergies   Cyclobenzaprine hcl and Sulfa antibiotics   Review of Systems Review of Systems  Respiratory: Negative for shortness of breath.   Cardiovascular: Negative for chest pain.  Gastrointestinal: Negative for abdominal pain.  Neurological: Negative for headaches.  All other systems reviewed and are negative.    Physical Exam Updated Vital Signs BP (!) 218/74 (BP Location: Left Arm)   Pulse 79   Temp 97.5 F (36.4 C) (Oral)   Resp 18   Ht 5\' 1"  (1.549 m)   Wt 230 lb (104.3 kg)   SpO2 95%   BMI 43.46 kg/m   Physical Exam  Constitutional: She is oriented to person, place, and time. She appears well-developed and well-nourished.  HENT:  Head: Normocephalic and atraumatic.  Eyes: EOM are normal. Pupils are equal, round, and reactive to light. Right eye exhibits no discharge. Left eye exhibits no discharge.  Neck: Normal range of motion.  Cardiovascular: Normal rate, regular rhythm and normal heart sounds.  Exam reveals no friction rub.   No murmur heard. Pulmonary/Chest: No stridor. No respiratory distress. She has no wheezes.  Abdominal: Soft. She exhibits no distension. There is no tenderness.  Musculoskeletal: Normal range of motion. She exhibits no edema or deformity.  Neurological: She is alert and oriented to person, place, and time. No cranial nerve deficit.  Skin: Skin is warm and dry.  Nursing note and vitals reviewed.    ED Treatments / Results  Labs (all labs ordered are listed, but only abnormal results are displayed) Labs Reviewed  CBC WITH DIFFERENTIAL/PLATELET - Abnormal; Notable for the following:       Result Value   RBC 5.23 (*)    Hemoglobin 15.8 (*)    HCT 47.5 (*)    All other components within normal limits  BASIC METABOLIC PANEL - Abnormal; Notable for the following:    Sodium 131 (*)    Chloride 96 (*)    Glucose, Bld 361 (*)    All other  components within normal limits  TROPONIN I    EKG  EKG Interpretation  Date/Time:  Wednesday July 09 2016 11:30:03 EST Ventricular Rate:  89 PR Interval:    QRS Duration: 88 QT Interval:  372 QTC Calculation: 453 R Axis:   -36 Text Interpretation:  Sinus rhythm Left axis deviation Abnormal R-wave progression, early transition Repol abnrm suggests ischemia, anterolateral slightly more prominent depression in V2, otherwise no significant change since 2011 Confirmed by [facility] MD, Eliel Dudding [phone]([id number]) on 07/09/2016 11:49:08 AM       Radiology Dg Chest 2 View  Result Date: 07/09/2016 CLINICAL DATA:  High blood pressure. EXAM: CHEST  2 VIEW COMPARISON:  06/22/2012 . FINDINGS: Mediastinum and hilar structures normal. Heart size stable. Low lung volumes. No pleural effusion or pneumothorax. Degenerative changes thoracic spine . IMPRESSION: 1. Low lung volumes. 2.  Exam otherwise unremarkable. Electronically Signed   By: Maisie Fushomas  Register   On: 07/09/2016 11:25    Procedures Procedures (including critical care time)  Medications Ordered in ED Medications  metFORMIN (GLUCOPHAGE) tablet 1,000 mg (1,000 mg Oral Given 07/09/16 1157)  metoprolol (LOPRESSOR) tablet 25 mg (25 mg Oral Given 07/09/16 1155)  lisinopril (PRINIVIL,ZESTRIL) tablet 10 mg (10 mg Oral Given 07/09/16 1155)  amLODipine (NORVASC) tablet 5 mg (5 mg Oral Given 07/09/16 1155)  acetaminophen (TYLENOL) tablet 650 mg (650 mg Oral Given 07/09/16 1319)  methocarbamol (ROBAXIN) tablet 500 mg (500 mg Oral Given 07/09/16 1319)     Initial Impression / Assessment and Plan / ED Course  I have reviewed the triage vital signs and the nursing notes.  Pertinent labs & imaging results that were available during my care of the patient were reviewed by me and considered in my medical decision making (see chart for details).  Clinical Course     81 year old female with diabetes hypertension is here with asymptomatic hypertension. Labs  were done just because of his reconciling all her medications. She had multiple medications that hadn't been filled since 2011 2014 and 2016. All of them are expired aside from 1. She also had multiple bottles with different doses of the same medications. Her daughter is a Engineer, civil (consulting)nurse and her granddaughter is a CNA who is present. I discussed with the granddaughter that all his medications are expired any dispose of them properly. I also restarted almost all of her medications. I used previous doses except for blood pressure medicines that started at lowest dose as possible. I also spoke with care management will get a nurse out to the house to help with medication management and then also help get the patient in the PCP as she hasn't seen one in a few years. Low suspicion for end organ damage from HTN at this time, but needs close follow up form med adjustments/prescriptions.   Final Clinical Impressions(s) / ED Diagnoses   Final diagnoses:  Secondary hypertension    New Prescriptions Discharge Medication List as of 07/09/2016  1:24 PM    START taking these medications   Details  methocarbamol (ROBAXIN) 500 MG tablet Take 1 tablet (500 mg total) by mouth 2 (two) times daily as needed for muscle spasms., Starting Wed 07/09/2016, Print         Marily MemosJason Roddy Bellamy, MD 07/10/16 [phone]

## 2016-07-09 NOTE — Discharge Planning (Signed)
CM spoke with patient via telephone concerning discharge planning. [facility]. [facility]  [facility] rep Avie Echevaria(Karen Nussbaum, RN)  contacted concerning new referral. No DME needs stated.

## 2016-07-09 NOTE — Discharge Planning (Signed)
[facility] Dr Annie MainVaradarjan to become established as PCP.

## 2016-07-09 NOTE — ED Triage Notes (Signed)
Pt states she has stopped seeing her PCP one one year ago as he retired.  Pt has not followed up since then.  Pt states she hasn't been taking her medication correctly.  Pt denies any symptoms:  No dizziness, no numbness, no headache, no blurry vision.  Pt states my family made me come in today.

## 2016-07-12 ENCOUNTER — Encounter (HOSPITAL_BASED_OUTPATIENT_CLINIC_OR_DEPARTMENT_OTHER): Payer: Self-pay | Admitting: Emergency Medicine

## 2016-07-12 ENCOUNTER — Emergency Department (HOSPITAL_BASED_OUTPATIENT_CLINIC_OR_DEPARTMENT_OTHER)
Admission: EM | Admit: 2016-07-12 | Discharge: 2016-07-12 | Disposition: A | Discharge status: Home / Self Care | Payer: Medicare Other | Attending: Emergency Medicine | Admitting: Emergency Medicine

## 2016-07-12 DIAGNOSIS — I2510 Atherosclerotic heart disease of native coronary artery without angina pectoris: Secondary | ICD-10-CM | POA: Insufficient documentation

## 2016-07-12 DIAGNOSIS — R21 Rash and other nonspecific skin eruption: Secondary | ICD-10-CM | POA: Diagnosis present

## 2016-07-12 DIAGNOSIS — Z7982 Long term (current) use of aspirin: Secondary | ICD-10-CM | POA: Insufficient documentation

## 2016-07-12 DIAGNOSIS — I10 Essential (primary) hypertension: Secondary | ICD-10-CM | POA: Insufficient documentation

## 2016-07-12 DIAGNOSIS — B029 Zoster without complications: Secondary | ICD-10-CM

## 2016-07-12 DIAGNOSIS — Z7984 Long term (current) use of oral hypoglycemic drugs: Secondary | ICD-10-CM | POA: Insufficient documentation

## 2016-07-12 DIAGNOSIS — E119 Type 2 diabetes mellitus without complications: Secondary | ICD-10-CM | POA: Insufficient documentation

## 2016-07-12 DIAGNOSIS — Z79899 Other long term (current) drug therapy: Secondary | ICD-10-CM | POA: Diagnosis not present

## 2016-07-12 DIAGNOSIS — I252 Old myocardial infarction: Secondary | ICD-10-CM | POA: Insufficient documentation

## 2016-07-12 MED ORDER — TRAMADOL HCL 50 MG PO TABS
50.0000 mg | ORAL_TABLET | Freq: Once | ORAL | Status: AC
  Administered 2016-07-12: 50 mg via ORAL
  Filled 2016-07-12: qty 1

## 2016-07-12 MED ORDER — TRAMADOL HCL 50 MG PO TABS: 50.0000 mg | ORAL_TABLET | Freq: Four times a day (QID) | ORAL | 0 refills | Status: DC | PRN

## 2016-07-12 MED ORDER — VALACYCLOVIR HCL 1 G PO TABS: 1000.0000 mg | ORAL_TABLET | Freq: Three times a day (TID) | ORAL | 0 refills | Status: AC

## 2016-07-12 NOTE — ED Triage Notes (Addendum)
Pt presents with red rash to back of neck, L shoulder and L chest area. Pt was seen here Wednesday for another problem and states the doctor looked at it. Pt states the rash is now worse. She reports the rash itches. Pt reports hx of eczema and reports using no new products. Pt states she has intermittent neck and L ear pain and was given a muscle relaxer.

## 2016-07-12 NOTE — ED Provider Notes (Signed)
[facility] Provider Note   CSN: [id number] Arrival date & time: 07/12/16  1044     History   Chief Complaint Chief Complaint  Patient presents with  . Rash    HPI Elizabeth [name] is a 81 y.o. female.  HPI Patient presents with rash to the left neck and shoulder region. States that initially started as a burning and sharp pain. Rash that appeared. She now is having pain described as sharp and burning radiating up high in the ear. She's been taking Tylenol with improvement of her symptoms for several hours. Denies any fever or chills. No visual changes. No neck stiffness. No intraoral rash. No known exposures. Recently started back on her blood pressure medication. States she has not taken her blood pressure medication this morning. Past Medical History:  Diagnosis Date  . Allergy   . CAD (coronary artery disease)   . Diabetes mellitus    type II  . Diabetic retinopathy    Dr Per[facility]in [facility] ([facility])  . Gastroenteritis 02/13/2013  . History of GI bleed    secondary to heparin  . Hypertension   . LBP (low back pain)   . Muscle cramp 02/13/2013  . Myocardial infarction   . Right hip pain 02/13/2013  . Tubal pregnancy     Patient Active Problem List   Diagnosis Date Noted  . URI (upper respiratory infection) 11/29/2014  . Diabetic neuropathy (HCC) 11/17/2014  . Right hip pain 02/13/2013  . Gastroenteritis 02/13/2013  . Muscle cramp 02/13/2013  . Chronic cough 06/22/2012  . Postmenopausal bone loss 02/11/2012  . Anemia 02/11/2012  . BACK PAIN, LUMBAR, CHRONIC 06/19/2010  . BACK PAIN, THORACIC REGION 04/15/2010  . Coronary atherosclerosis 09/24/2009  . Type 2 diabetes, uncontrolled, with retinopathy (HCC) 09/20/2009  . Essential hypertension 09/20/2009  . MYOCARDIAL INFARCTION, HX OF 09/20/2009  . ALLERGIC RHINITIS 09/20/2009    Past Surgical History:  Procedure Laterality Date  . APPENDECTOMY    . CATARACT EXTRACTION    . LAMINOTOMY      Dr Jordan LikesPool L3-4  and microdiskectomy  . TONSILLECTOMY      OB History    No data available       Home Medications    Prior to Admission medications   Medication Sig Start Date End Date Taking? Authorizing Provider  amLODipine (NORVASC) 10 MG tablet Take 1 tablet (10 mg total) by mouth daily. 07/09/16   Marily MemosJason Mesner, MD  aspirin 81 MG tablet Take 1 tablet (81 mg total) by mouth daily. 07/09/16   Marily MemosJason Mesner, MD  atorvastatin (LIPITOR) 40 MG tablet Take 1 tablet (40 mg total) by mouth daily. 07/09/16   Marily MemosJason Mesner, MD  clopidogrel (PLAVIX) 75 MG tablet Take 1 tablet (75 mg total) by mouth every morning. 07/09/16   Marily MemosJason Mesner, MD  glucose blood test strip Use as instructed to check blood sugar 3 times per day Diagnoses Code 250.00 07/09/16   Marily MemosJason Mesner, MD  Lancets (FREESTYLE) lancets Use as instructed to check blood sugar 3 times per day Diagnoses Code 250.00 07/09/16   Marily MemosJason Mesner, MD  lisinopril (PRINIVIL,ZESTRIL) 20 MG tablet Take 0.5 tablets (10 mg total) by mouth daily. 07/09/16   Marily MemosJason Mesner, MD  metformin (FORTAMET) 500 MG (OSM) 24 hr tablet Take 1 tablet (500 mg total) by mouth 2 (two) times daily with a meal. 07/09/16   Marily MemosJason Mesner, MD  methocarbamol (ROBAXIN) 500 MG tablet Take 1 tablet (500 mg total) by mouth  2 (two) times daily as needed for muscle spasms. 07/09/16   Marily MemosJason Mesner, MD  metoprolol (LOPRESSOR) 50 MG tablet Take 0.5 tablets (25 mg total) by mouth 2 (two) times daily. 07/09/16   Marily MemosJason Mesner, MD  nitroGLYCERIN (NITROSTAT) 0.4 MG SL tablet Place 1 tablet (0.4 mg total) under the tongue every 5 (five) minutes as needed for chest pain. 07/09/16   Marily MemosJason Mesner, MD  ondansetron (ZOFRAN) 4 MG tablet Take 1 tablet (4 mg total) by mouth 3 (three) times daily as needed for nausea or vomiting. 08/16/13   Bradd CanaryStacey A Blyth, MD  PATADAY 0.2 % SOLN Place 1 drop into both eyes daily. 08/25/14   Historical Provider, MD  traMADol (ULTRAM) 50 MG tablet Take 1 tablet (50 mg total)  by mouth every 6 (six) hours as needed for severe pain. 07/12/16   Loren Raceravid Eliel Dudding, MD  valACYclovir (VALTREX) 1000 MG tablet Take 1 tablet (1,000 mg total) by mouth 3 (three) times daily. 07/12/16 07/19/16  Loren Raceravid Nassim Cosma, MD    Family History Family History  Problem Relation Age of Onset  . Stroke Father   . Coronary artery disease      Social History Social History  Substance Use Topics  . Smoking status: Never Smoker  . Smokeless tobacco: Never Used  . Alcohol use No     Allergies   Cyclobenzaprine hcl and Sulfa antibiotics   Review of Systems Review of Systems  Constitutional: Negative for chills and fever.  HENT: Negative for mouth sores.   Eyes: Negative for pain and visual disturbance.  Respiratory: Negative for shortness of breath.   Cardiovascular: Negative for chest pain.  Gastrointestinal: Negative for abdominal pain, nausea and vomiting.  Musculoskeletal: Positive for myalgias and neck pain. Negative for arthralgias and neck stiffness.  Skin: Negative for rash.  Neurological: Negative for dizziness, weakness, light-headedness, numbness and headaches.  All other systems reviewed and are negative.    Physical Exam Updated Vital Signs BP 200/87 (BP Location: Left Arm)   Pulse 70   Temp 97.5 F (36.4 C) (Oral)   Resp 18   SpO2 96%   Physical Exam  Constitutional: She is oriented to person, place, and time. She appears well-developed and well-nourished. No distress.  HENT:  Head: Normocephalic and atraumatic.  Mouth/Throat: Oropharynx is clear and moist. No oropharyngeal exudate.  Oropharynx is clear. No lesions present.  Eyes: EOM are normal. Pupils are equal, round, and reactive to light.  No scleral injection or rash in the periorbital region.  Neck: Normal range of motion. Neck supple.  No meningismus. No cervical lymphadenopathy.  Cardiovascular: Normal rate and regular rhythm.   Pulmonary/Chest: Effort normal and breath sounds normal.    Abdominal: Soft. Bowel sounds are normal. There is no tenderness. There is no rebound and no guarding.  Musculoskeletal: Normal range of motion. She exhibits no edema or tenderness.  Full range of motion of all joints without swelling, erythema or warmth. No lower extremity swelling, asymmetry or tenderness.  Neurological: She is alert and oriented to person, place, and time.  5/5 motor in all extremities. Ambulating without any difficulty. Sensation is intact.  Skin: Skin is warm and dry. Capillary refill takes less than 2 seconds. Rash noted. Rash is maculopapular. She is not diaphoretic. There is erythema.     Psychiatric: She has a normal mood and affect. Her behavior is normal.  Nursing note and vitals reviewed.    ED Treatments / Results  Labs (all labs ordered are listed, but  only abnormal results are displayed) Labs Reviewed - No data to display  EKG  EKG Interpretation None       Radiology No results found.  Procedures Procedures (including critical care time)  Medications Ordered in ED Medications  traMADol (ULTRAM) tablet 50 mg (50 mg Oral Given 07/12/16 1129)     Initial Impression / Assessment and Plan / ED Course  I have reviewed the triage vital signs and the nursing notes.  Pertinent labs & imaging results that were available during my care of the patient were reviewed by me and considered in my medical decision making (see chart for details).  Clinical Course     Exam is consistent with varicella-zoster. Will start on valacyclovir and treat with tramadol. Patient understands need to follow-up with her primary physician and then return immediately if there is any worsening of her symptoms, fever or any concerns. Patient's blood pressure is elevated though she is in pain and has not taken her blood pressure medication this morning. She is instructed to take her blood pressure medication as prescribed. Final Clinical Impressions(s) / ED Diagnoses    Final diagnoses:  Herpes zoster without complication    New Prescriptions New Prescriptions   TRAMADOL (ULTRAM) 50 MG TABLET    Take 1 tablet (50 mg total) by mouth every 6 (six) hours as needed for severe pain.   VALACYCLOVIR (VALTREX) 1000 MG TABLET    Take 1 tablet (1,000 mg total) by mouth 3 (three) times daily.     Loren Racer, MD 07/12/16 1150

## 2016-07-31 ENCOUNTER — Telehealth: Payer: Self-pay | Admitting: *Deleted

## 2016-07-31 NOTE — Telephone Encounter (Signed)
Called patient and left message to return call

## 2016-08-05 ENCOUNTER — Telehealth: Payer: Self-pay | Admitting: Behavioral Health

## 2016-08-05 ENCOUNTER — Encounter: Payer: Self-pay | Admitting: Behavioral Health

## 2016-08-05 NOTE — Telephone Encounter (Signed)
Pre-Visit Call completed with patient and chart updated.   Pre-Visit Info documented in Specialty Comments under Snap[employer].    

## 2016-08-06 ENCOUNTER — Encounter: Payer: Self-pay | Admitting: Family Medicine

## 2016-08-06 ENCOUNTER — Ambulatory Visit (INDEPENDENT_AMBULATORY_CARE_PROVIDER_SITE_OTHER): Payer: Medicare Other | Admitting: Family Medicine

## 2016-08-06 VITALS — BP 172/84 | HR 72 | Temp 98.6°F | Ht 61.0 in | Wt 218.8 lb

## 2016-08-06 DIAGNOSIS — E1165 Type 2 diabetes mellitus with hyperglycemia: Secondary | ICD-10-CM

## 2016-08-06 DIAGNOSIS — E11319 Type 2 diabetes mellitus with unspecified diabetic retinopathy without macular edema: Secondary | ICD-10-CM

## 2016-08-06 DIAGNOSIS — B0229 Other postherpetic nervous system involvement: Secondary | ICD-10-CM

## 2016-08-06 DIAGNOSIS — I10 Essential (primary) hypertension: Secondary | ICD-10-CM | POA: Diagnosis not present

## 2016-08-06 DIAGNOSIS — Z23 Encounter for immunization: Secondary | ICD-10-CM

## 2016-08-06 LAB — COMPREHENSIVE METABOLIC PANEL
ALBUMIN: 3.9 g/dL (ref 3.5–5.2)
ALK PHOS: 61 U/L (ref 39–117)
ALT: 20 U/L (ref 0–35)
AST: 14 U/L (ref 0–37)
BUN: 18 mg/dL (ref 6–23)
CALCIUM: 9.1 mg/dL (ref 8.4–10.5)
CHLORIDE: 102 meq/L (ref 96–112)
CO2: 28 mEq/L (ref 19–32)
Creatinine, Ser: 0.87 mg/dL (ref 0.40–1.20)
GFR: 66.71 mL/min (ref >60.00)
Glucose, Bld: 334 mg/dL — ABNORMAL HIGH (ref 70–99)
POTASSIUM: 4.2 meq/L (ref 3.5–5.1)
Sodium: 139 mEq/L (ref 135–145)
TOTAL PROTEIN: 6.6 g/dL (ref 6.0–8.3)
Total Bilirubin: 0.5 mg/dL (ref 0.2–1.2)

## 2016-08-06 LAB — MICROALBUMIN / CREATININE URINE RATIO
CREATININE, U: 68.8 mg/dL
MICROALB UR: 6.3 mg/dL — AB (ref 0.0–1.9)
MICROALB/CREAT RATIO: 9.2 mg/g (ref 0.0–30.0)

## 2016-08-06 LAB — HEMOGLOBIN A1C

## 2016-08-06 LAB — LIPID PANEL
CHOLESTEROL: 90 mg/dL (ref 0–200)
HDL: 36.3 mg/dL — ABNORMAL LOW (ref >39.00)
LDL Cholesterol: 27 mg/dL (ref 0–99)
NONHDL: 53.4
Total CHOL/HDL Ratio: 2
Triglycerides: 131 mg/dL (ref 0.0–149.0)
VLDL: 26.2 mg/dL (ref 0.0–40.0)

## 2016-08-06 MED ORDER — GABAPENTIN 300 MG PO CAPS: 300.0000 mg | ORAL_CAPSULE | Freq: Three times a day (TID) | ORAL | 0 refills | Status: DC

## 2016-08-06 NOTE — Patient Instructions (Signed)
Take Neurontin at night. If it does not make you drowsy, OK to take during the day. Can take up to 3 capsules at night.  Keep a record of your blood pressure readings at home. Bring them to your appointment in 2 weeks.

## 2016-08-06 NOTE — Addendum Note (Signed)
Addended by: Verdie ShireBAYNES, Richardson Dubree M on: 08/06/2016 05:59 PM   Modules accepted: Orders

## 2016-08-06 NOTE — Progress Notes (Signed)
Chief Complaint  Patient presents with  . Establish Care    shingles    Subjective: Patient is a 81 y.o. female here for ED f/u.  The patient was seen around 4 weeks ago diagnosed with shingles in the emergency department. She is placed on tramadol and Valtrex. She finished her Valtrex and notes some improvement. She still having some itching and mild burning. She also has a history of eczema and believes this is contributing to her misery.  She is new to our office. She used to see Dr. Artist PaisYoo. She has not had routine lab work done in over a year. She is diabetic and her last A1c was 12.6.  ROS: Skin: As noted in history of present illness  Family History  Problem Relation Age of Onset  . Stroke Father   . Coronary artery disease    . Heart disease Mother    Past Medical History:  Diagnosis Date  . Allergy   . CAD (coronary artery disease)   . Diabetes mellitus    type II  . Diabetic retinopathy    [facility]in [facility] ([facility])  . Gastroenteritis 02/13/2013  . History of GI bleed    secondary to heparin  . Hypertension   . LBP (low back pain)   . Muscle cramp 02/13/2013  . Myocardial infarction   . Right hip pain 02/13/2013  . Shingles   . Tubal pregnancy    Allergies  Allergen Reactions  . Cyclobenzaprine Hcl     REACTION: rash  . Sulfa Antibiotics     Current Outpatient Prescriptions:  .  amLODipine (NORVASC) 10 MG tablet, Take 1 tablet (10 mg total) by mouth daily., Disp: 30 tablet, Rfl: 1 .  aspirin 81 MG tablet, Take 1 tablet (81 mg total) by mouth daily., Disp: 30 tablet, Rfl: 1 .  atorvastatin (LIPITOR) 40 MG tablet, Take 1 tablet (40 mg total) by mouth daily., Disp: 30 tablet, Rfl: 1 .  clopidogrel (PLAVIX) 75 MG tablet, Take 1 tablet (75 mg total) by mouth every morning., Disp: 30 tablet, Rfl: 1 .  glucose blood test strip, Use as instructed to check blood sugar 3 times per day Diagnoses Code 250.00, Disp: 300 each, Rfl: prn .  Lancets (FREESTYLE)  lancets, Use as instructed to check blood sugar 3 times per day Diagnoses Code 250.00, Disp: 300 each, Rfl: prn .  lisinopril (PRINIVIL,ZESTRIL) 20 MG tablet, Take 0.5 tablets (10 mg total) by mouth daily., Disp: 30 tablet, Rfl: 0 .  metformin (FORTAMET) 500 MG (OSM) 24 hr tablet, Take 1 tablet (500 mg total) by mouth 2 (two) times daily with a meal., Disp: 60 tablet, Rfl: 1 .  methocarbamol (ROBAXIN) 500 MG tablet, Take 1 tablet (500 mg total) by mouth 2 (two) times daily as needed for muscle spasms., Disp: 20 tablet, Rfl: 0 .  metoprolol (LOPRESSOR) 50 MG tablet, Take 0.5 tablets (25 mg total) by mouth 2 (two) times daily., Disp: 30 tablet, Rfl: 1 .  nitroGLYCERIN (NITROSTAT) 0.4 MG SL tablet, Place 1 tablet (0.4 mg total) under the tongue every 5 (five) minutes as needed for chest pain., Disp: 15 tablet, Rfl: 0 .  ondansetron (ZOFRAN) 4 MG tablet, Take 1 tablet (4 mg total) by mouth 3 (three) times daily as needed for nausea or vomiting., Disp: 30 tablet, Rfl: 3 .  PATADAY 0.2 % SOLN, Place 1 drop into both eyes daily., Disp: , Rfl:  .  traMADol (ULTRAM) 50 MG tablet, Take 1  tablet (50 mg total) by mouth every 6 (six) hours as needed for severe pain., Disp: 15 tablet, Rfl: 0 .  gabapentin (NEURONTIN) 300 MG capsule, Take 1 capsule (300 mg total) by mouth 3 (three) times daily., Disp: 90 capsule, Rfl: 0  Objective: BP (!) 172/84 (BP Location: Left Arm, Patient Position: Sitting, Cuff Size: Small)   Pulse 72   Temp 98.6 F (37 C) (Oral)   Ht 5\' 1"  (1.549 m)   Wt 218 lb 12.8 oz (99.2 kg)   SpO2 97%   BMI 41.34 kg/m  General: Awake, appears stated age HEENT: MMM, EOMi Heart: RRR, no murmurs, 2+ pitting ankle edema bl Lungs: CTAB, no rales, wheezes or rhonchi. No accessory muscle use Psych: Age appropriate judgment and insight, normal affect and mood  Assessment and Plan: Post herpetic neuralgia - Plan: gabapentin (NEURONTIN) 300 MG capsule  Essential hypertension - Plan: Comprehensive  metabolic panel  Uncontrolled type 2 diabetes mellitus with retinopathy, without long-term current use of insulin, macular edema presence unspecified, unspecified laterality, unspecified retinopathy severity (HCC) - Plan: Hemoglobin A1c, Microalbumin / creatinine urine ratio, Lipid panel  Orders as above. Neurontin for neuropathic pain. Orders as above for labs. Follow-up in 2 weeks for a dedicated diabetes visit. I would also like her to start keeping track of home blood pressure readings and bring her log to her next appointment. The patient voiced understanding and agreement to the plan.  Jilda Rocheicholas Paul AniwaWendling, DO 08/06/16  10:56 AM

## 2016-08-06 NOTE — Progress Notes (Signed)
TL/CMA

## 2016-08-06 NOTE — Progress Notes (Signed)
Pre visit review using our clinic review tool, if applicable. No additional management support is needed unless otherwise documented below in the visit note. 

## 2016-08-21 ENCOUNTER — Ambulatory Visit: Payer: Medicare Other | Admitting: Family Medicine

## 2016-08-21 ENCOUNTER — Telehealth: Payer: Self-pay | Admitting: Family Medicine

## 2016-08-21 NOTE — Telephone Encounter (Signed)
Patient canceled her 10am appointment today due to the weather, [facility], charge or no charge

## 2016-08-21 NOTE — Telephone Encounter (Signed)
No charge. 

## 2016-08-28 ENCOUNTER — Ambulatory Visit: Payer: Medicare Other | Admitting: Family Medicine

## 2016-08-28 ENCOUNTER — Encounter: Payer: Self-pay | Admitting: Family Medicine

## 2016-08-28 ENCOUNTER — Ambulatory Visit (INDEPENDENT_AMBULATORY_CARE_PROVIDER_SITE_OTHER): Payer: Medicare Other | Admitting: Family Medicine

## 2016-08-28 VITALS — BP 170/60 | HR 71 | Temp 97.6°F | Ht 61.0 in | Wt 219.6 lb

## 2016-08-28 DIAGNOSIS — I10 Essential (primary) hypertension: Secondary | ICD-10-CM

## 2016-08-28 DIAGNOSIS — E1165 Type 2 diabetes mellitus with hyperglycemia: Secondary | ICD-10-CM | POA: Diagnosis not present

## 2016-08-28 DIAGNOSIS — E11319 Type 2 diabetes mellitus with unspecified diabetic retinopathy without macular edema: Secondary | ICD-10-CM | POA: Diagnosis not present

## 2016-08-28 MED ORDER — METFORMIN HCL ER 750 MG PO TB24: 750.0000 mg | ORAL_TABLET | Freq: Every day | ORAL | 2 refills | Status: DC

## 2016-08-28 MED ORDER — PIOGLITAZONE HCL 30 MG PO TABS: 30.0000 mg | ORAL_TABLET | Freq: Every day | ORAL | 2 refills | Status: DC

## 2016-08-28 MED ORDER — CANAGLIFLOZIN 100 MG PO TABS: 100.0000 mg | ORAL_TABLET | Freq: Every day | ORAL | 1 refills | Status: DC

## 2016-08-28 NOTE — Patient Instructions (Addendum)
Check your blood pressure 3-4 times per week until your nurse visit. Bring your cuff and readings to your nurse visit in 2 weeks.  OK to check sugars 2-4 times per week or if you feel "off".   Keep up your progress with walking and cleaning up your diet.  Let us know if you have any problems with your medication or if anything is too expensive.  Claritin or Allegra for drainage.

## 2016-08-28 NOTE — Progress Notes (Signed)
Subjective:   Chief Complaint  Patient presents with  . Follow-up    in 2 weeks of DM    Shanvi S Crites is a 82 y.o. female here for follow-up of diabetes.   Karmela has not routinely been checking her sugars Patient denies hypoglycemic reactions. Patient does not require insulin.   Medications include: Metformin XR 500 mg BID; she is having some nausea associated with it. Patient exercises 0 days per week on average.   She does take an aspirin daily. Statin? Yes ACEi/ARB? Yes Her most recent A1c was 12.6.  Hypertension Patient presents for hypertension follow up. She does monitor home blood pressures. She is compliant with medications- Norvasc 10 mg daily, Lisinopril 20 mg daily, . Patient has these side effects of medication: none She is adhering to a healthy diet overall. Exercise: None  Past Medical History:  Diagnosis Date  . Allergy   . CAD (coronary artery disease)   . Diabetes mellitus    type II  . Diabetic retinopathy    Dr Perley Jain @ [facility] (vitrectomy left eye)  . Gastroenteritis 02/13/2013  . History of GI bleed    secondary to heparin  . Hypertension   . LBP (low back pain)   . Muscle cramp 02/13/2013  . Myocardial infarction   . Right hip pain 02/13/2013  . Shingles   . Tubal pregnancy     Past Surgical History:  Procedure Laterality Date  . APPENDECTOMY    . CATARACT EXTRACTION    . EYE SURGERY    . LAMINOTOMY     Dr Jordan Likes L3-4  and microdiskectomy  . TONSILLECTOMY    . TUBAL LIGATION      Social History   Social History  . Marital status: Married   Social History Main Topics  . Smoking status: Never Smoker  . Smokeless tobacco: Never Used  . Alcohol use No  . Drug use: No   Current Outpatient Prescriptions on File Prior to Visit  Medication Sig Dispense Refill  . amLODipine (NORVASC) 10 MG tablet Take 1 tablet (10 mg total) by mouth daily. 30 tablet 1  . aspirin 81 MG tablet Take 1 tablet (81 mg total) by mouth daily. 30 tablet 1  .  atorvastatin (LIPITOR) 40 MG tablet Take 1 tablet (40 mg total) by mouth daily. 30 tablet 1  . clopidogrel (PLAVIX) 75 MG tablet Take 1 tablet (75 mg total) by mouth every morning. 30 tablet 1  . gabapentin (NEURONTIN) 300 MG capsule Take 1 capsule (300 mg total) by mouth 3 (three) times daily. 90 capsule 0  . glucose blood test strip Use as instructed to check blood sugar 3 times per day Diagnoses Code 250.00 300 each prn  . Lancets (FREESTYLE) lancets Use as instructed to check blood sugar 3 times per day Diagnoses Code 250.00 300 each prn  . lisinopril (PRINIVIL,ZESTRIL) 20 MG tablet Take 0.5 tablets (10 mg total) by mouth daily. 30 tablet 0  . metformin (FORTAMET) 500 MG (OSM) 24 hr tablet Take 1 tablet (500 mg total) by mouth 2 (two) times daily with a meal. 60 tablet 1  . methocarbamol (ROBAXIN) 500 MG tablet Take 1 tablet (500 mg total) by mouth 2 (two) times daily as needed for muscle spasms. 20 tablet 0  . metoprolol (LOPRESSOR) 50 MG tablet Take 0.5 tablets (25 mg total) by mouth 2 (two) times daily. 30 tablet 1  . nitroGLYCERIN (NITROSTAT) 0.4 MG SL tablet Place 1 tablet (0.4 mg total) under  the tongue every 5 (five) minutes as needed for chest pain. 15 tablet 0  . PATADAY 0.2 % SOLN Place 1 drop into both eyes daily.      Review of Systems: Eye:  No recent significant change in vision Pulmonary:  No SOB Cardiovascular:  No chest pain, no palpitations Skin/Integumentary ROS:  No abnormal skin lesions reported Neurologic:  No numbness, tingling  Objective:  BP (!) 170/60 (BP Location: Left Arm, Patient Position: Sitting, Cuff Size: Large)   Pulse 71   Temp 97.6 F (36.4 C) (Oral)   Ht 5\' 1"  (1.549 m)   Wt 219 lb 9.6 oz (99.6 kg)   SpO2 98%   BMI 41.49 kg/m  General:  Well developed, well nourished, in no apparent distress Skin:  Warm, no pallor or diaphoresis Head:  Normocephalic, atraumatic Eyes:  Pupils equal and round, sclera anicteric without injection  Nose:  External  nares without trauma, no discharge Throat/Pharynx:  Lips and gingiva without lesion Neck: Neck supple.  No obvious thyromegaly or masses.  No bruits Lungs:  clear to auscultation, breath sounds equal bilaterally, no wheezes, rales, or stridor Cardio:  regular rate and rhythm without murmurs, no bruits, no LE edema Abdomen:  Abdomen soft, non-tender, BS normal Musculoskeletal:  Symmetrical muscle groups noted without atrophy or deformity Neuro:  Sensation intact to pinprick on feet Psych: Age appropriate judgment and insight  Assessment:   Uncontrolled type 2 diabetes mellitus with retinopathy, without long-term current use of insulin, macular edema presence unspecified, unspecified laterality, unspecified retinopathy severity (HCC) - Plan: metFORMIN (GLUCOPHAGE-XR) 750 MG 24 hr tablet, pioglitazone (ACTOS) 30 MG tablet, canagliflozin (INVOKANA) 100 MG TABS tablet  Essential hypertension   Plan:   Orders as above. I will change her metformin to 750 mg once daily to see if this helps with her nausea. I will add both Actos and Invokana. As her A1c is not between 7 and 9, the benefit of a GLP-1 or DPP-4 inhibitor is lessened. I did discuss starting insulin with the patient. She was adamant that she would like to use this as a last resort. I agree with her. Counseled on diet and exercise. Her blood pressure is poorly controlled. She does state that she has white coat syndrome. I would like her to keep her blood pressure 2-4 times per week over the next 2 weeks and return for a nurse visit. I would like to bring her blood pressure cuff to this visit as well. F/u in 1 mo to see how she is doing on new medication. The patient voiced understanding and agreement to the plan.  Jilda Rocheicholas Paul KingfisherWendling, DO 08/28/16 11:57 AM

## 2016-09-05 ENCOUNTER — Other Ambulatory Visit: Payer: Self-pay

## 2016-09-05 MED ORDER — ATORVASTATIN CALCIUM 40 MG PO TABS: 40.0000 mg | ORAL_TABLET | Freq: Every day | ORAL | 1 refills | Status: DC

## 2016-09-05 MED ORDER — ATORVASTATIN CALCIUM 40 MG PO TABS
40.0000 mg | ORAL_TABLET | Freq: Every day | ORAL | 1 refills | Status: DC
Start: 2016-09-05 — End: 2017-03-09

## 2016-09-05 MED ORDER — CLOPIDOGREL BISULFATE 75 MG PO TABS: 75.0000 mg | ORAL_TABLET | Freq: Every morning | ORAL | 0 refills | Status: DC

## 2016-09-05 MED ORDER — AMLODIPINE BESYLATE 10 MG PO TABS: 10.0000 mg | ORAL_TABLET | Freq: Every day | ORAL | 1 refills | Status: DC

## 2016-09-05 MED ORDER — ASPIRIN 81 MG PO TABS: 81.0000 mg | ORAL_TABLET | Freq: Every day | ORAL | 1 refills | Status: DC

## 2016-09-05 MED ORDER — ASPIRIN 81 MG PO TABS
81.0000 mg | ORAL_TABLET | Freq: Every day | ORAL | 1 refills | Status: DC
Start: 2016-09-05 — End: 2016-09-05

## 2016-09-05 NOTE — Addendum Note (Signed)
Addended by: Kandace BlitzLONG, Emmalin Jaquess M on: 09/05/2016 04:00 PM   Modules accepted: Orders

## 2016-09-05 NOTE — Telephone Encounter (Signed)
I have refilled Rx for pt with #90 tablets #1 refill for each. TL/CMA

## 2016-09-06 ENCOUNTER — Other Ambulatory Visit: Payer: Self-pay | Admitting: Family Medicine

## 2016-09-08 MED ORDER — METOPROLOL TARTRATE 50 MG PO TABS: 25.0000 mg | ORAL_TABLET | Freq: Two times a day (BID) | ORAL | 1 refills | Status: DC

## 2016-09-08 NOTE — Telephone Encounter (Signed)
Refills sent. Notified pt. 

## 2016-09-08 NOTE — Telephone Encounter (Signed)
Pt called in to follow up on refill request. ALSO, pt says that she need a refill on her Metoprolol Rx.  Pt would like to have Rx sent to the the pharmacy provided below.     [facility]

## 2016-09-11 ENCOUNTER — Ambulatory Visit (INDEPENDENT_AMBULATORY_CARE_PROVIDER_SITE_OTHER): Payer: Medicare Other | Admitting: Family Medicine

## 2016-09-11 VITALS — BP 150/75 | HR 73

## 2016-09-11 DIAGNOSIS — I10 Essential (primary) hypertension: Secondary | ICD-10-CM | POA: Diagnosis not present

## 2016-09-11 MED ORDER — LISINOPRIL 20 MG PO TABS: 20.0000 mg | ORAL_TABLET | Freq: Every day | ORAL | 1 refills | Status: DC

## 2016-09-11 NOTE — Progress Notes (Signed)
Pre visit review using our clinic review tool, if applicable. No additional management support is needed unless otherwise documented below in the visit note.  Patient came in office for blood pressure check per OV note 08/28/16. Reviewed medications & current regimen. Patient reported that she did not take both Lisinopril & Metoprolol for a week due to an inability to obtain medications from local pharmacy. The issue has now been resolved and she resumed taking the medications on this past Tuesday. PCP was made aware. Today's readings were as follow: BP 147/77 P 70 & 150/75 P 73.  Per Dr. Carmelia RollerWendling: Increase Lisinopril to 20 mg once daily. Continue current regimen with all other medications. Keep follow-up appointment with PCP on 09/29/16 at 10:00 AM.  Informed patient of the provider's instructions. She voiced understanding and did not have any further questions or concerns before leaving the nurse visit.

## 2016-09-11 NOTE — Patient Instructions (Addendum)
Per Dr. Carmelia RollerWendling: Increase Lisinopril to 20 mg once daily. Continue current regimen with all other medications. Keep follow-up appointment with PCP on 09/29/16 at 10:00 AM.

## 2016-09-12 NOTE — Progress Notes (Signed)
Noted. Agree with above.  

## 2016-09-29 ENCOUNTER — Ambulatory Visit: Payer: Medicare Other | Admitting: Family Medicine

## 2016-10-23 ENCOUNTER — Other Ambulatory Visit: Payer: Self-pay | Admitting: Family Medicine

## 2016-10-23 ENCOUNTER — Ambulatory Visit: Payer: Medicare Other | Admitting: Family Medicine

## 2016-10-23 DIAGNOSIS — E11319 Type 2 diabetes mellitus with unspecified diabetic retinopathy without macular edema: Secondary | ICD-10-CM

## 2016-10-23 DIAGNOSIS — E1165 Type 2 diabetes mellitus with hyperglycemia: Principal | ICD-10-CM

## 2016-10-27 NOTE — Telephone Encounter (Signed)
Rx sent to the pharmacy by e-script.//AB/CMA 

## 2016-11-13 ENCOUNTER — Ambulatory Visit: Payer: Medicare Other | Admitting: Family Medicine

## 2016-11-25 ENCOUNTER — Other Ambulatory Visit: Payer: Self-pay | Admitting: Family Medicine

## 2016-11-25 DIAGNOSIS — E11319 Type 2 diabetes mellitus with unspecified diabetic retinopathy without macular edema: Secondary | ICD-10-CM

## 2016-11-25 DIAGNOSIS — E1165 Type 2 diabetes mellitus with hyperglycemia: Principal | ICD-10-CM

## 2016-11-26 NOTE — Telephone Encounter (Signed)
Rx's sent to the pharmacy by e-script.  However only given #30.  The patient needs further evaluation and/or laboratory testing before further refills are given.  Ask patient to make an appointment for this.//AB/CMA

## 2016-11-27 ENCOUNTER — Ambulatory Visit: Payer: Medicare Other | Admitting: Family Medicine

## 2016-12-02 ENCOUNTER — Telehealth: Payer: Self-pay | Admitting: *Deleted

## 2016-12-02 NOTE — Telephone Encounter (Signed)
No answer

## 2016-12-04 ENCOUNTER — Ambulatory Visit (INDEPENDENT_AMBULATORY_CARE_PROVIDER_SITE_OTHER): Payer: Medicare Other | Admitting: Family Medicine

## 2016-12-04 ENCOUNTER — Encounter: Payer: Self-pay | Admitting: Family Medicine

## 2016-12-04 VITALS — BP 152/50 | HR 97 | Temp 97.4°F | Ht 61.0 in | Wt 226.0 lb

## 2016-12-04 DIAGNOSIS — E11319 Type 2 diabetes mellitus with unspecified diabetic retinopathy without macular edema: Secondary | ICD-10-CM | POA: Diagnosis not present

## 2016-12-04 DIAGNOSIS — Z23 Encounter for immunization: Secondary | ICD-10-CM | POA: Diagnosis not present

## 2016-12-04 DIAGNOSIS — I10 Essential (primary) hypertension: Secondary | ICD-10-CM

## 2016-12-04 DIAGNOSIS — E1165 Type 2 diabetes mellitus with hyperglycemia: Secondary | ICD-10-CM

## 2016-12-04 LAB — BASIC METABOLIC PANEL
BUN: 18 mg/dL (ref 6–23)
CHLORIDE: 106 meq/L (ref 96–112)
CO2: 28 meq/L (ref 19–32)
Calcium: 9.4 mg/dL (ref 8.4–10.5)
Creatinine, Ser: 0.92 mg/dL (ref 0.40–1.20)
GFR: 62.49 mL/min (ref >60.00)
GLUCOSE: 140 mg/dL — AB (ref 70–99)
Potassium: 4.3 mEq/L (ref 3.5–5.1)
Sodium: 142 mEq/L (ref 135–145)

## 2016-12-04 LAB — HEMOGLOBIN A1C: HEMOGLOBIN A1C: 7 % — AB (ref 4.6–6.5)

## 2016-12-04 MED ORDER — CHLORTHALIDONE 25 MG PO TABS: 25.0000 mg | ORAL_TABLET | Freq: Every day | ORAL | 2 refills | Status: DC

## 2016-12-04 MED ORDER — AMLODIPINE BESYLATE-VALSARTAN 5-320 MG PO TABS: 1.0000 | ORAL_TABLET | Freq: Every day | ORAL | 1 refills | Status: DC

## 2016-12-04 MED ORDER — GLUCOSE BLOOD VI STRP: ORAL_STRIP | 12 refills | Status: DC

## 2016-12-04 NOTE — Patient Instructions (Signed)
Around 3 times per week, check your blood pressure 4 times per day. Twice in the morning and twice in the evening. The readings should be at least one minute apart. Write down these values and bring them to your next nurse visit/appointment.  When you check your BP, make sure you have been doing something calm/relaxing 5 minutes prior to checking. Both feet should be flat on the floor and you should be sitting. Use your left arm and make sure it is in a relaxed position (on a table), and that the cuff is at the approximate level/height of your heart.  Stop taking your Lisinopril and Norvasc. A combination pill is being called in to replace them (Exforge).  Healthy Eating Plan Many factors influence your heart health, including eating and exercise habits. Heart (coronary) risk increases with abnormal blood fat (lipid) levels. Heart-healthy meal planning includes limiting unhealthy fats, increasing healthy fats, and making other small dietary changes. This includes maintaining a healthy body weight to help keep lipid levels within a normal range.  WHAT IS MY PLAN?  Your health care provider recommends that you:  Drink a glass of water before meals to help with satiety.  Eat slowly.  An alternative to the water is to add Metamucil. This will help with satiety as well. It does contain calories, unlike water.  WHAT TYPES OF FAT SHOULD I CHOOSE?  Choose healthy fats more often. Choose monounsaturated and polyunsaturated fats, such as olive oil and canola oil, flaxseeds, walnuts, almonds, and seeds.  Eat more omega-3 fats. Good choices include salmon, mackerel, sardines, tuna, flaxseed oil, and ground flaxseeds. Aim to eat fish at least two times each week.  Avoid foods with partially hydrogenated oils in them. These contain trans fats. Examples of foods that contain trans fats are stick margarine, some tub margarines, cookies, crackers, and other baked goods. If you are going to avoid a fat, this  is the one to avoid!  WHAT GENERAL GUIDELINES DO I NEED TO FOLLOW?  Check food labels carefully to identify foods with trans fats. Avoid these types of options when possible.  Fill one half of your plate with vegetables and green salads. Eat 4-5 servings of vegetables per day. A serving of vegetables equals 1 cup of raw leafy vegetables,  cup of raw or cooked cut-up vegetables, or  cup of vegetable juice.  Fill one fourth of your plate with whole grains. Look for the word "whole" as the first word in the ingredient list.  Fill one fourth of your plate with lean protein foods.  Eat 4-5 servings of fruit per day. A serving of fruit equals one medium whole fruit,  cup of dried fruit,  cup of fresh, frozen, or canned fruit. Try to avoid fruits in cups/syrups as the sugar content can be high.  Eat more foods that contain soluble fiber. Examples of foods that contain this type of fiber are apples, broccoli, carrots, beans, peas, and barley. Aim to get 20-30 g of fiber per day.  Eat more home-cooked food and less restaurant, buffet, and fast food.  Limit or avoid alcohol.  Limit foods that are high in starch and sugar.  Avoid fried foods when able.  Cook foods by using methods other than frying. Baking, boiling, grilling, and broiling are all great options. Other fat-reducing suggestions include: ? Removing the skin from poultry. ? Removing all visible fats from meats. ? Skimming the fat off of stews, soups, and gravies before serving them. ? Steaming vegetables  in water or broth.  Lose weight if you are overweight. Losing just 5-10% of your initial body weight can help your overall health and prevent diseases such as diabetes and heart disease.  Increase your consumption of nuts, legumes, and seeds to 4-5 servings per week. One serving of dried beans or legumes equals  cup after being cooked, one serving of nuts equals 1 ounces, and one serving of seeds equals  ounce or 1  tablespoon.  WHAT ARE GOOD FOODS CAN I EAT? Grains Grainy breads (try to find bread that is 3 g of fiber per slice or greater), oatmeal, light popcorn. Whole-grain cereals. Rice and pasta, including brown rice and those that are made with whole wheat. Edamame pasta is a great alternative to grain pasta. It has a higher protein content. Try to avoid significant consumption of white bread, sugary cereals, or pastries/baked goods.  Vegetables All vegetables. Cooked white potatoes do not count as vegetables.  Fruits All fruits, but limit pineapple and bananas as these fruits have a higher sugar content.  Meats and Other Protein Sources Lean, well-trimmed beef, veal, pork, and lamb. Chicken and [place] without skin. All fish and shellfish. Wild duck, rabbit, pheasant, and venison. Egg whites or low-cholesterol egg substitutes. Dried beans, peas, lentils, and tofu.Seeds and most nuts.  Dairy Low-fat or nonfat cheeses, including ricotta, string, and mozzarella. Skim or 1% milk that is liquid, powdered, or evaporated. Buttermilk that is made with low-fat milk. Nonfat or low-fat yogurt. Soy/Almond milk are good alternatives if you cannot handle dairy.  Beverages Water is the best for you. Sports drinks with less sugar are more desirable unless you are a highly active athlete.  Sweets and Desserts Sherbets and fruit ices. Honey, jam, marmalade, jelly, and syrups. Dark chocolate.  Eat all sweets and desserts in moderation.  Fats and Oils Nonhydrogenated (trans-free) margarines. Vegetable oils, including soybean, sesame, sunflower, olive, peanut, safflower, corn, canola, and cottonseed. Salad dressings or mayonnaise that are made with a vegetable oil. Limit added fats and oils that you use for cooking, baking, salads, and as spreads.  Other Cocoa powder. Coffee and tea. Most condiments.  The items listed above may not be a complete list of recommended foods or beverages. Contact your dietitian for  more options.

## 2016-12-04 NOTE — Progress Notes (Signed)
Subjective:   Chief Complaint  Patient presents with  . Follow-up    on DM and HTN    Elizabeth [name] is a 82 y.o. female here for follow-up of diabetes.   Patient denies hypoglycemic reactions. She does not check her sugars at home Medications include: Metformin 750 mg XL, Actos 30 mg daily, Invokana 100 mg daily. Patient exercises 0 days per week on average.   She does take an aspirin daily. Diet is not very healthy right now Statin? Yes ACEi/ARB? Yes  Hypertension Patient presents for hypertension follow up. She does monitor home blood pressures. Blood pressures ranging on average from 140-150's/80's. She is compliant with medications- lisinopril 20 mg daily, norvasc 10 mg daily, lopressor 25 mg BID. Patient has these side effects of medication: none She is not adhering to a healthy diet overall. Exercise: none   Past Medical History:  Diagnosis Date  . Allergy   . CAD (coronary artery disease)   . Diabetes mellitus    type II  . Diabetic retinopathy    Dr Perley Jain @ [facility] (vitrectomy left eye)  . Gastroenteritis 02/13/2013  . History of GI bleed    secondary to heparin  . Hypertension   . LBP (low back pain)   . Muscle cramp 02/13/2013  . Myocardial infarction (HCC)   . Right hip pain 02/13/2013  . Shingles   . Tubal pregnancy     Past Surgical History:  Procedure Laterality Date  . APPENDECTOMY    . CATARACT EXTRACTION    . EYE SURGERY    . LAMINOTOMY     Dr Jordan Likes L3-4  and microdiskectomy  . TONSILLECTOMY    . TUBAL LIGATION      Social History   Social History  . Marital status: Married   Occupational History  . Retired Retired   Social History Main Topics  . Smoking status: Never Smoker  . Smokeless tobacco: Never Used  . Alcohol use No  . Drug use: No   Current Outpatient Prescriptions on File Prior to Visit  Medication Sig Dispense Refill  . amLODipine (NORVASC) 10 MG tablet Take 1 tablet (10 mg total) by mouth daily. 90 tablet 1  .  aspirin 81 MG tablet Take 1 tablet (81 mg total) by mouth daily. 90 tablet 1  . atorvastatin (LIPITOR) 40 MG tablet Take 1 tablet (40 mg total) by mouth daily. 90 tablet 1  . clopidogrel (PLAVIX) 75 MG tablet Take 1 tablet (75 mg total) by mouth every morning. 90 tablet 0  . gabapentin (NEURONTIN) 300 MG capsule Take 1 capsule (300 mg total) by mouth 3 (three) times daily. 90 capsule 0  . INVOKANA 100 MG TABS tablet TAKE 1 TABLET(100 MG) BY MOUTH DAILY BEFORE BREAKFAST 30 tablet 0  . Lancets (FREESTYLE) lancets Use as instructed to check blood sugar 3 times per day Diagnoses Code 250.00 300 each prn  . lisinopril (PRINIVIL,ZESTRIL) 20 MG tablet Take 1 tablet (20 mg total) by mouth daily. 90 tablet 1  . metFORMIN (GLUCOPHAGE-XR) 750 MG 24 hr tablet TAKE 1 TABLET(750 MG) BY MOUTH DAILY WITH BREAKFAST 30 tablet 0  . methocarbamol (ROBAXIN) 500 MG tablet Take 1 tablet (500 mg total) by mouth 2 (two) times daily as needed for muscle spasms. 20 tablet 0  . metoprolol (LOPRESSOR) 50 MG tablet Take 0.5 tablets (25 mg total) by mouth 2 (two) times daily. 90 tablet 1  . nitroGLYCERIN (NITROSTAT) 0.4 MG SL tablet Place 1 tablet (0.4 mg  total) under the tongue every 5 (five) minutes as needed for chest pain. 15 tablet 0  . PATADAY 0.2 % SOLN Place 1 drop into both eyes daily.    . pioglitazone (ACTOS) 30 MG tablet TAKE 1 TABLET(30 MG) BY MOUTH DAILY 30 tablet 0  . triamcinolone cream (KENALOG) 0.1 % Apply to affect area as needed.      Related testing: Foot exam(monofilament and inspection):done Retinal exam:done Date of retinal exam: 04/2016  Done by:  Dr. Leonia Corona Pneumovax: done Flu Shot: done  Review of Systems: Eye:  No recent significant change in vision Pulmonary:  No SOB Cardiovascular:  No chest pain, no palpitations Skin/Integumentary ROS:  No abnormal skin lesions reported Neurologic:  No numbness, tingling  Objective:  BP (!) 152/50 (BP Location: Left Arm, Patient Position: Sitting, Cuff  Size: Large)   Pulse 97   Temp 97.4 F (36.3 C) (Oral)   Ht  (1.549 m)   Wt 226 lb (102.5 kg)   SpO2 98%   BMI 42.70 kg/m  General:  Well developed, well nourished, in no apparent distress Skin:  Warm, no pallor or diaphoresis Head:  Normocephalic, atraumatic Lungs: No accessory muscle use Cardio:  regular rate and rhythm, brisk cap refill, no LE edemal Musculoskeletal:  Symmetrical muscle groups noted without atrophy or deformity Neuro:  Sensation intact to pinprick on feet Psych: Age appropriate judgment and insight  Assessment:   Uncontrolled type 2 diabetes mellitus with retinopathy, without long-term current use of insulin, macular edema presence unspecified, unspecified laterality, unspecified retinopathy severity (HCC) - Plan: Hemoglobin A1c, HM Diabetes Foot Exam  Essential hypertension - Plan: amLODipine-valsartan (EXFORGE) 5-320 MG tablet, chlorthalidone (HYGROTON) 25 MG tablet, Basic metabolic panel, Basic metabolic panel  Need for Tdap vaccination - Plan: Tdap vaccine greater than or equal to 7yo IM  Need for pneumococcal vaccination - Plan: Pneumococcal conjugate vaccine 13-valent   Plan:  HTN: uncontrolled Orders as above. Will see what A1c is, consider GLP-1 vs DPP-4 depending on number. Really counseled on importance of healthy diet-handout given. Also counseled on exercise. Combined ARB and CCB with Exforge, Stop Norvasc and lisinoprilbmp. We'll add chlorthalidone. We'll check labs today and in one week. F/u in 2 weeks to recheck BP. The patient voiced understanding and agreement to the plan.  Jilda Roche [place], DO 12/04/16 1:17 PM

## 2016-12-04 NOTE — Progress Notes (Signed)
Pre visit review using our clinic review tool, if applicable. No additional management support is needed unless otherwise documented below in the visit note. 

## 2016-12-11 ENCOUNTER — Other Ambulatory Visit: Payer: Self-pay | Admitting: Family Medicine

## 2016-12-12 NOTE — Telephone Encounter (Signed)
Rx sent to the pharmacy by e-script.//AB/CMA 

## 2016-12-16 NOTE — Telephone Encounter (Signed)
LM for patient to return call to schedule AWV.   

## 2016-12-18 ENCOUNTER — Encounter: Payer: Self-pay | Admitting: Family Medicine

## 2016-12-18 ENCOUNTER — Ambulatory Visit (INDEPENDENT_AMBULATORY_CARE_PROVIDER_SITE_OTHER): Payer: Medicare Other | Admitting: Family Medicine

## 2016-12-18 VITALS — BP 138/60 | HR 64 | Temp 97.7°F | Ht 61.0 in | Wt 218.2 lb

## 2016-12-18 DIAGNOSIS — M810 Age-related osteoporosis without current pathological fracture: Secondary | ICD-10-CM

## 2016-12-18 DIAGNOSIS — I10 Essential (primary) hypertension: Secondary | ICD-10-CM | POA: Diagnosis not present

## 2016-12-18 DIAGNOSIS — E11319 Type 2 diabetes mellitus with unspecified diabetic retinopathy without macular edema: Secondary | ICD-10-CM

## 2016-12-18 DIAGNOSIS — Z0000 Encounter for general adult medical examination without abnormal findings: Secondary | ICD-10-CM | POA: Diagnosis not present

## 2016-12-18 LAB — BASIC METABOLIC PANEL
BUN: 39 mg/dL — ABNORMAL HIGH (ref 6–23)
CHLORIDE: 103 meq/L (ref 96–112)
CO2: 30 meq/L (ref 19–32)
Calcium: 9.1 mg/dL (ref 8.4–10.5)
Creatinine, Ser: 1.46 mg/dL — ABNORMAL HIGH (ref 0.40–1.20)
GFR: 36.67 mL/min — ABNORMAL LOW (ref >60.00)
Glucose, Bld: 155 mg/dL — ABNORMAL HIGH (ref 70–99)
POTASSIUM: 4.2 meq/L (ref 3.5–5.1)
Sodium: 138 mEq/L (ref 135–145)

## 2016-12-18 NOTE — Patient Instructions (Addendum)
OK to check BP 3-4 times per month.   Keep checking sugars intermittently.  Aim to do some physical exertion for 150 minutes per week. This is typically divided into 5 days per week, 30 minutes per day. The activity should be enough to get your heart rate up. Anything is better than nothing if you have time constraints.  Let us know if you need anything.   Elizabeth [name] , Thank you for taking time to come for your Medicare Wellness Visit. I appreciate your ongoing commitment to your health goals. Please review the following plan we discussed and let me know if I can assist you in the future.   These are the goals we discussed: Goals    . Eat more fruits and vegetables    . Increase physical activity          Increase walking to 20 minutes daily and continue to increase physical activity as tolerated.       This is a list of the screening recommended for you and due dates:  Health Maintenance  Topic Date Due  . Flu Shot  03/18/2017  . Eye exam for diabetics  05/18/2017  . Hemoglobin A1C  06/05/2017  . Complete foot exam   12/04/2017  . Tetanus Vaccine  12/05/2026  . DEXA scan (bone density measurement)  Completed  . Pneumonia vaccines  Completed    Preventive Care 74 Years and Older, Female Preventive care refers to lifestyle choices and visits with your health care provider that can promote health and wellness. What does preventive care include?  A yearly physical exam. This is also called an annual well check.  Dental exams once or twice a year.  Routine eye exams. Ask your health care provider how often you should have your eyes checked.  Personal lifestyle choices, including:  Daily care of your teeth and gums.  Regular physical activity.  Eating a healthy diet.  Avoiding tobacco and drug use.  Limiting alcohol use.  Practicing safe sex.  Taking low-dose aspirin every day.  Taking vitamin and mineral supplements as recommended by your health care  provider. What happens during an annual well check? The services and screenings done by your health care provider during your annual well check will depend on your age, overall health, lifestyle risk factors, and family history of disease. Counseling  Your health care provider may ask you questions about your:  Alcohol use.  Tobacco use.  Drug use.  Emotional well-being.  Home and relationship well-being.  Sexual activity.  Eating habits.  History of falls.  Memory and ability to understand (cognition).  Work and work Statistician.  Reproductive health. Screening  You may have the following tests or measurements:  Height, weight, and BMI.  Blood pressure.  Lipid and cholesterol levels. These may be checked every 5 years, or more frequently if you are over 35 years old.  Skin check.  Lung cancer screening. You may have this screening every year starting at age 68 if you have a 30-pack-year history of smoking and currently smoke or have quit within the past 15 years.  Fecal occult blood test (FOBT) of the stool. You may have this test every year starting at age 19.  Flexible sigmoidoscopy or colonoscopy. You may have a sigmoidoscopy every 5 years or a colonoscopy every 10 years starting at age 66.  Hepatitis C blood test.  Hepatitis B blood test.  Sexually transmitted disease (STD) testing.  Diabetes screening. This is done by checking your  blood sugar (glucose) after you have not eaten for a while (fasting). You may have this done every 1-3 years.  Bone density scan. This is done to screen for osteoporosis. You may have this done starting at age 77.  Mammogram. This may be done every 1-2 years. Talk to your health care provider about how often you should have regular mammograms. Talk with your health care provider about your test results, treatment options, and if necessary, the need for more tests. Vaccines  Your health care provider may recommend certain  vaccines, such as:  Influenza vaccine. This is recommended every year.  Tetanus, diphtheria, and acellular pertussis (Tdap, Td) vaccine. You may need a Td booster every 10 years.  Varicella vaccine. You may need this if you have not been vaccinated.  Zoster vaccine. You may need this after age 63.  Measles, mumps, and rubella (MMR) vaccine. You may need at least one dose of MMR if you were born in 1957 or later. You may also need a second dose.  Pneumococcal 13-valent conjugate (PCV13) vaccine. One dose is recommended after age 52.  Pneumococcal polysaccharide (PPSV23) vaccine. One dose is recommended after age 2.  Meningococcal vaccine. You may need this if you have certain conditions.  Hepatitis A vaccine. You may need this if you have certain conditions or if you travel or work in places where you may be exposed to hepatitis A.  Hepatitis B vaccine. You may need this if you have certain conditions or if you travel or work in places where you may be exposed to hepatitis B.  Haemophilus influenzae type b (Hib) vaccine. You may need this if you have certain conditions. Talk to your health care provider about which screenings and vaccines you need and how often you need them. This information is not intended to replace advice given to you by your health care provider. Make sure you discuss any questions you have with your health care provider. Document Released: 08/31/2015 Document Revised: 04/23/2016 Document Reviewed: 06/05/2015 Elsevier Interactive Patient Education  2017 [employer].

## 2016-12-18 NOTE — Progress Notes (Signed)
Pre visit review using our clinic review tool, if applicable. No additional management support is needed unless otherwise documented below in the visit note. 

## 2016-12-18 NOTE — Progress Notes (Addendum)
Subjective:   Elizabeth [name] is a 82 y.o. female who presents for an Initial Medicare Annual Wellness Visit.  The Patient was informed that the wellness visit is to identify future health risk and educate and initiate measures that can reduce risk for increased disease through the lifespan.   Describes health as fair, good or great? "I think good."  Review of Systems    No ROS.  Medicare Wellness Visit.  Cardiac Risk Factors include: advanced age (>15men, >29 women);diabetes mellitus;dyslipidemia;hypertension;obesity (BMI >30kg/m2);sedentary lifestyle  Sleep patterns: no sleep issues, gets up 2 times nightly to void and sleeps 7 hours nightly.   Home Safety/Smoke Alarms: Feels safe in home. Smoke alarms in place.    Living environment; residence and Firearm Safety: Lives w/ husband. Daughter lives 3 houses down. 2-story house, can live on one level, walk-in shower, equipment: Hygiene Aides, Type: Tub Seat/Chair, firearms stored safely. Seat Belt Safety/Bike Helmet: Wears seat belt.   Counseling:   Eye Exam- Follows w/ Dr. Gwendalyn Ege at [facility]  Dental- Follows w/ Dr. Rick Duff in [facility] every 6 months  Female:   Pap- Aged out       Mammo- Aged out       Dexa scan- last 03/02/12. Osteoporosis.      CCS- Not on file.     Objective:    Today's Vitals   12/18/16 1020  BP: 138/60  Pulse: 64  Temp: 97.7 F (36.5 C)  TempSrc: Oral  SpO2: 97%  Weight: 218 lb 3.2 oz (99 kg)  Height: 5\' 1"  (1.549 m)   Body mass index is 41.23 kg/m.   Current Medications (verified) Outpatient Encounter Prescriptions as of 12/18/2016  Medication Sig  . amLODipine-valsartan (EXFORGE) 5-320 MG tablet Take 1 tablet by mouth daily.  Marland Kitchen aspirin 81 MG tablet Take 1 tablet (81 mg total) by mouth daily.  Marland Kitchen atorvastatin (LIPITOR) 40 MG tablet Take 1 tablet (40 mg total) by mouth daily.  . chlorthalidone (HYGROTON) 25 MG tablet Take 1 tablet (25 mg total) by mouth daily.  . clopidogrel (PLAVIX) 75  MG tablet TAKE 1 TABLET(75 MG) BY MOUTH EVERY MORNING  . gabapentin (NEURONTIN) 300 MG capsule Take 1 capsule (300 mg total) by mouth 3 (three) times daily.  Marland Kitchen glucose blood test strip Use as instructed to check blood sugar 3 times per day Diagnoses Code 250.00  . INVOKANA 100 MG TABS tablet TAKE 1 TABLET(100 MG) BY MOUTH DAILY BEFORE BREAKFAST  . Lancets (FREESTYLE) lancets Use as instructed to check blood sugar 3 times per day Diagnoses Code 250.00  . metFORMIN (GLUCOPHAGE-XR) 750 MG 24 hr tablet TAKE 1 TABLET(750 MG) BY MOUTH DAILY WITH BREAKFAST  . methocarbamol (ROBAXIN) 500 MG tablet Take 1 tablet (500 mg total) by mouth 2 (two) times daily as needed for muscle spasms.  . metoprolol (LOPRESSOR) 50 MG tablet Take 0.5 tablets (25 mg total) by mouth 2 (two) times daily.  . nitroGLYCERIN (NITROSTAT) 0.4 MG SL tablet Place 1 tablet (0.4 mg total) under the tongue every 5 (five) minutes as needed for chest pain.  Marland Kitchen PATADAY 0.2 % SOLN Place 1 drop into both eyes daily.  . pioglitazone (ACTOS) 30 MG tablet TAKE 1 TABLET(30 MG) BY MOUTH DAILY  . triamcinolone cream (KENALOG) 0.1 % Apply to affect area as needed.   No facility-administered encounter medications on file as of 12/18/2016.     Allergies (verified) Sulfa antibiotics and Cyclobenzaprine hcl   History: Past Medical History:  Diagnosis  Date  . Allergy   . CAD (coronary artery disease)   . Diabetes mellitus    type II  . Diabetic retinopathy    [facility]in [facility] ([facility]rectomy left [facility])  . Gastroenteritis 02/13/2013  . History of GI bleed    secondary to heparin  . Hypertension   . LBP (low back pain)   . Muscle cramp 02/13/2013  . Myocardial infarction (HCC)   . Right hip pain 02/13/2013  . Shingles   . Tubal pregnancy    Past Surgical History:  Procedure Laterality Date  . APPENDECTOMY    . CATARACT EXTRACTION    . EYE SURGERY    . LAMINOTOMY     Dr Jordan LikesPool L3-4  and microdiskectomy  . TONSILLECTOMY    . TUBAL LIGATION       Family History  Problem Relation Age of Onset  . Stroke Father   . Coronary artery disease    . Heart disease Mother    Social History   Occupational History  . Retired Retired   Social History Main Topics  . Smoking status: Never Smoker  . Smokeless tobacco: Never Used  . Alcohol use No  . Drug use: No  . Sexual activity: No    Tobacco Counseling Counseling given: Not Answered   Activities of Daily Living In your present state of health, do you have any difficulty performing the following activities: 12/18/2016 08/06/2016  Hearing? N N  Vision? Y Y  Difficulty concentrating or making decisions? N N  Walking or climbing stairs? N N  Dressing or bathing? N N  Doing errands, shopping? Y N  Preparing Food and eating ? N -  Using the Toilet? N -  In the past six months, have you accidently leaked urine? N -  Do you have problems with loss of bowel control? N -  Managing your Medications? N -  Managing your Finances? N -  Housekeeping or managing your Housekeeping? N -  Some recent data might be hidden    Immunizations and Health Maintenance Immunization History  Administered Date(s) Administered  . Influenza Split 06/24/2011, 06/22/2012  . Influenza Whole 09/20/2009, 04/15/2010  . Influenza, High Dose Seasonal PF 06/06/2013, 08/06/2016  . Pneumococcal Conjugate-13 12/04/2016  . Pneumococcal Polysaccharide-23 09/22/2006  . Td 03/02/2006  . Tdap 12/04/2016   There are no preventive care reminders to display for this patient.  Patient Care Team: Sharlene DoryNicholas Paul [name], DO as PCP - General (Family Medicine) Marvis Repressraig Greven, MD as Consulting Physician (Ophthalmology) Karene Fryhomas G. Judithe ModestFolk, MD as Consulting Physician (Cardiology) Jackelyn HoehnSamuel C Kirby, MD as Consulting Physician (Dermatology) Rick DuffJason Russell, DDS (Dentistry)  Indicate any recent Medical Services you may have received from other than [facility] providers in the past year (date may be approximate).     Assessment:    This is a routine wellness examination for Elizabeth [name]. Physical assessment deferred to PCP.  Hearing/Vision screen Hearing Screening Comments: Able to hear conversational tones w/o difficulty. No issues reported. Passes whisper test at 5 feet. Vision Screening Comments: Wears reading glasses. 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 Follows w/ Dr. Leonia CoronaGrevin every 9 months.  Dietary issues and exercise activities discussed: Current Exercise Habits: The patient does not participate in regular exercise at present  Diet (meal preparation, eat out, water intake, caffeinated beverages, dairy products, fruits and vegetables): in general, an "unhealthy" diet, on average, 2 meals per day. Daughter makes dinner every night. Meals vary-salad, vegetable, meat in the evenings, some pasta. Pt cooks dinner on  Sundays. Trying to eat more vegetables and fruits.    Goals    . Eat more fruits and vegetables    . Increase physical activity          Increase walking to 20 minutes daily and continue to increase physical activity as tolerated.      Depression Screen PHQ 2/9 Scores 12/18/2016 08/06/2016 11/29/2014  PHQ - 2 Score 0 0 0    Fall Risk Fall Risk  12/18/2016 08/06/2016 11/29/2014  Falls in the past year? No No No    Cognitive Function: MMSE - Mini Mental State Exam 12/18/2016  Orientation to time 5  Orientation to Place 5  Registration 3  Attention/ Calculation 5  Recall 3  Language- name 2 objects 2  Language- repeat 1  Language- follow 3 step command 3  Language- read & follow direction 1  Write a sentence 1  Copy design 1  Total score 30        Screening Tests Health Maintenance  Topic Date Due  . INFLUENZA VACCINE  03/18/2017  . OPHTHALMOLOGY EXAM  05/18/2017  . HEMOGLOBIN A1C  06/05/2017  . FOOT EXAM  12/04/2017  . TETANUS/TDAP  12/05/2026  . DEXA SCAN  Completed  . PNA vac Low Risk Adult  Completed      Plan:    Follow-up w/ PCP as directed.  Order placed for bone density.   Create and  bring a copy of your advance directives to your next office visit.  Emmi education on diabetes and diet assigned via portal.  I have personally reviewed and noted the following in the patient's chart:   . Medical and social history . Use of alcohol, tobacco or illicit drugs  . Current medications and supplements . Functional ability and status . Nutritional status . Physical activity . Advanced directives . List of other physicians . Vitals . Screenings to include cognitive, depression, and falls . Referrals and appointments  In addition, I have reviewed and discussed with patient certain preventive protocols, quality metrics, and best practice recommendations. A written personalized care plan for preventive services as well as general preventive health recommendations were provided to patient.     Starla Link, RN   12/18/2016    Noted. Agree with above. Jilda Roche [place], [place] 5:48 PM 12/26/16

## 2016-12-18 NOTE — Progress Notes (Signed)
Chief Complaint  Patient presents with  . Follow-up    2 weeks on HTN-pt brought in BP readings    Subjective Elizabeth [name] is a 82 y.o. female who presents for hypertension follow up. She does monitor home blood pressures. Blood pressures ranging from 120-130's/60-70's on average. Did have a day of readings of 110's/40-50's and she did not feel well. She is compliant with medications- Exforge 5-320 mg daily and was recently started on Chlorthalidone 25 mg daily. Patient has these side effects of medication: none She is adhering to a healthy diet overall. Current exercise: None  She also notes that since starting Invokana and Actos, she has noticed increased gas and bloating. It does not bother her enough to come off of medicine. Her A1c had gone from 12.6 to 7.0 after starting this. No diarrhea.    Past Medical History:  Diagnosis Date  . Allergy   . CAD (coronary artery disease)   . Diabetes mellitus    type II  . Diabetic retinopathy    [facility]in [facility] ([facility] left [facility])  . Gastroenteritis 02/13/2013  . History of GI bleed    secondary to heparin  . Hypertension   . LBP (low back pain)   . Muscle cramp 02/13/2013  . Myocardial infarction (HCC)   . Right hip pain 02/13/2013  . Shingles   . Tubal pregnancy    Family History  Problem Relation Age of Onset  . Stroke Father   . Coronary artery disease    . Heart disease Mother      Medications Current Outpatient Prescriptions on File Prior to Visit  Medication Sig Dispense Refill  . amLODipine-valsartan (EXFORGE) 5-320 MG tablet Take 1 tablet by mouth daily. 90 tablet 1  . aspirin 81 MG tablet Take 1 tablet (81 mg total) by mouth daily. 90 tablet 1  . atorvastatin (LIPITOR) 40 MG tablet Take 1 tablet (40 mg total) by mouth daily. 90 tablet 1  . chlorthalidone (HYGROTON) 25 MG tablet Take 1 tablet (25 mg total) by mouth daily. 30 tablet 2  . clopidogrel (PLAVIX) 75 MG tablet TAKE 1 TABLET(75 MG) BY MOUTH EVERY  MORNING 90 tablet 1  . gabapentin (NEURONTIN) 300 MG capsule Take 1 capsule (300 mg total) by mouth 3 (three) times daily. 90 capsule 0  . glucose blood test strip Use as instructed to check blood sugar 3 times per day Diagnoses Code 250.00 100 each 12  . INVOKANA 100 MG TABS tablet TAKE 1 TABLET(100 MG) BY MOUTH DAILY BEFORE BREAKFAST 30 tablet 0  . Lancets (FREESTYLE) lancets Use as instructed to check blood sugar 3 times per day Diagnoses Code 250.00 300 each prn  . metFORMIN (GLUCOPHAGE-XR) 750 MG 24 hr tablet TAKE 1 TABLET(750 MG) BY MOUTH DAILY WITH BREAKFAST 30 tablet 0  . methocarbamol (ROBAXIN) 500 MG tablet Take 1 tablet (500 mg total) by mouth 2 (two) times daily as needed for muscle spasms. 20 tablet 0  . metoprolol (LOPRESSOR) 50 MG tablet Take 0.5 tablets (25 mg total) by mouth 2 (two) times daily. 90 tablet 1  . nitroGLYCERIN (NITROSTAT) 0.4 MG SL tablet Place 1 tablet (0.4 mg total) under the tongue every 5 (five) minutes as needed for chest pain. 15 tablet 0  . PATADAY 0.2 % SOLN Place 1 drop into both eyes daily.    . pioglitazone (ACTOS) 30 MG tablet TAKE 1 TABLET(30 MG) BY MOUTH DAILY 30 tablet 0  . triamcinolone cream (KENALOG) 0.1 %  Apply to affect area as needed.     Allergies Allergies  Allergen Reactions  . Sulfa Antibiotics Hives  . Cyclobenzaprine Hcl Rash    Review of Systems Cardiovascular: no chest pain Respiratory:  no shortness of breath  Exam BP 138/60 (BP Location: Left Arm, Patient Position: Sitting, Cuff Size: Normal)   Pulse 64   Temp 97.7 F (36.5 C) (Oral)   Ht 5\' 1"  (1.549 m)   Wt 218 lb 3.2 oz (99 kg)   SpO2 97%   BMI 41.23 kg/m  General:  well developed, well nourished, in no apparent distress Skin:  warm, no pallor or diaphoresis Heart :RRR, no murmurs, no bruits, no LE edema Lungs:  clear to auscultation, no accessory muscle use Psych: well oriented with normal range of affect and appropriate judgment/insight  Essential  hypertension  Controlled type 2 diabetes mellitus with retinopathy, without long-term current use of insulin, macular edema presence unspecified, unspecified laterality, unspecified retinopathy severity (HCC)  Orders as above. Counseled on diet and exercise. Will keep things the same as I do not want to lower her diastolic BP any further. Will also keep DM regimen the same for now. If the gas/bloating keeps bothering her, will discuss decreasing dose of Actos vs changing to another med. She has made significant improvements after addition of these. She will let us know if anything changes. F/u in 11 mo at end of July. The patient voiced understanding and agreement to the plan.  Jilda Roche [place], DO 12/18/16  11:00 AM

## 2016-12-20 ENCOUNTER — Other Ambulatory Visit: Payer: Self-pay | Admitting: Family Medicine

## 2016-12-20 DIAGNOSIS — E11319 Type 2 diabetes mellitus with unspecified diabetic retinopathy without macular edema: Secondary | ICD-10-CM

## 2016-12-20 DIAGNOSIS — E1165 Type 2 diabetes mellitus with hyperglycemia: Principal | ICD-10-CM

## 2016-12-22 ENCOUNTER — Telehealth: Payer: Self-pay | Admitting: *Deleted

## 2016-12-22 DIAGNOSIS — N179 Acute kidney failure, unspecified: Secondary | ICD-10-CM

## 2016-12-22 NOTE — Telephone Encounter (Signed)
-----   Message from Sharlene DoryNicholas Paul [name], DO sent at 12/19/2016  7:14 AM EDT ----- AKI. Will stop chlorthalidone, encourage hydration, recheck in 1 week.  AB- let pt know that she should stop taking her newer blood pressure medicine chlorthalidone for now. Drink lots of fluids. I would like to recheck both her BP and BMP in 1 week. Please place order and schedule please, dx acute kidney injury. TY.

## 2016-12-22 NOTE — Telephone Encounter (Signed)
Called and spoke with the pt on (12/19/16) and informed her of recent lab results and note.  Pt verbalized understanding and agreed.  Pt had no questions or concerns.  Pt was scheduled a nurse and lab appt for (Thurs-12/25/16 @ 9:15 and 9:30 am).  Future lab ordered and sent.//AB/CMA

## 2016-12-22 NOTE — Telephone Encounter (Signed)
Rx's sent to the pharmacy by e-script.//AB/CMA 

## 2016-12-25 ENCOUNTER — Other Ambulatory Visit (INDEPENDENT_AMBULATORY_CARE_PROVIDER_SITE_OTHER): Payer: Medicare Other

## 2016-12-25 ENCOUNTER — Ambulatory Visit (HOSPITAL_BASED_OUTPATIENT_CLINIC_OR_DEPARTMENT_OTHER)
Admission: RE | Admit: 2016-12-25 | Discharge: 2016-12-25 | Disposition: A | Discharge status: Home / Self Care | Payer: Medicare Other | Source: Ambulatory Visit | Attending: Family Medicine | Admitting: Family Medicine

## 2016-12-25 ENCOUNTER — Ambulatory Visit (INDEPENDENT_AMBULATORY_CARE_PROVIDER_SITE_OTHER): Payer: Medicare Other | Admitting: Family Medicine

## 2016-12-25 DIAGNOSIS — M810 Age-related osteoporosis without current pathological fracture: Secondary | ICD-10-CM | POA: Diagnosis not present

## 2016-12-25 DIAGNOSIS — Z13820 Encounter for screening for osteoporosis: Secondary | ICD-10-CM | POA: Diagnosis not present

## 2016-12-25 DIAGNOSIS — I10 Essential (primary) hypertension: Secondary | ICD-10-CM

## 2016-12-25 DIAGNOSIS — N179 Acute kidney failure, unspecified: Secondary | ICD-10-CM | POA: Diagnosis not present

## 2016-12-25 LAB — BASIC METABOLIC PANEL
BUN: 35 mg/dL — ABNORMAL HIGH (ref 6–23)
CHLORIDE: 104 meq/L (ref 96–112)
CO2: 29 meq/L (ref 19–32)
CREATININE: 1.21 mg/dL — AB (ref 0.40–1.20)
Calcium: 9.2 mg/dL (ref 8.4–10.5)
GFR: 45.54 mL/min — ABNORMAL LOW (ref >60.00)
GLUCOSE: 159 mg/dL — AB (ref 70–99)
Potassium: 4.3 mEq/L (ref 3.5–5.1)
Sodium: 140 mEq/L (ref 135–145)

## 2016-12-25 NOTE — Progress Notes (Signed)
Pre visit review using our clinic tool,if applicable. No additional management support is needed unless otherwise documented below in the visit note.  Patient in for BP check per order dated 12/22/16 from Dr. Arva ChafeNicholas [name].   Patients Chlorthalidone discontinued on 12/19/16. Patient states she feels good no complaints voiced.  Today's BP=   124/49 P = 66  Per Dr. Carmelia RollerWendling, continue medications a ordered and return on scheduled appointment date. Patient agreed.

## 2016-12-25 NOTE — Progress Notes (Signed)
Noted. Agree with above.  

## 2016-12-26 ENCOUNTER — Telehealth: Payer: Self-pay | Admitting: Family Medicine

## 2016-12-26 NOTE — Telephone Encounter (Signed)
Let pt know her labs have improved. Will recheck at next appt in July. TY.

## 2016-12-26 NOTE — Telephone Encounter (Signed)
[facility] [phone]) informing her of recent lab results and note below.  Also let her know it is okay to wait for her next appt in July to discuss BDS results. Asked the pt to RTC if she has any questions or concerns.//AB/CMA

## 2017-03-05 ENCOUNTER — Ambulatory Visit (INDEPENDENT_AMBULATORY_CARE_PROVIDER_SITE_OTHER): Payer: Medicare Other | Admitting: Family Medicine

## 2017-03-05 ENCOUNTER — Ambulatory Visit (HOSPITAL_BASED_OUTPATIENT_CLINIC_OR_DEPARTMENT_OTHER)
Admission: RE | Admit: 2017-03-05 | Discharge: 2017-03-05 | Disposition: A | Discharge status: Home / Self Care | Payer: Medicare Other | Source: Ambulatory Visit | Attending: Family Medicine | Admitting: Family Medicine

## 2017-03-05 ENCOUNTER — Encounter: Payer: Self-pay | Admitting: Family Medicine

## 2017-03-05 VITALS — BP 140/56 | HR 73 | Temp 97.5°F | Ht 61.0 in | Wt 231.6 lb

## 2017-03-05 DIAGNOSIS — I517 Cardiomegaly: Secondary | ICD-10-CM | POA: Insufficient documentation

## 2017-03-05 DIAGNOSIS — R0602 Shortness of breath: Secondary | ICD-10-CM | POA: Diagnosis not present

## 2017-03-05 DIAGNOSIS — E11319 Type 2 diabetes mellitus with unspecified diabetic retinopathy without macular edema: Secondary | ICD-10-CM | POA: Diagnosis not present

## 2017-03-05 DIAGNOSIS — J90 Pleural effusion, not elsewhere classified: Secondary | ICD-10-CM | POA: Diagnosis not present

## 2017-03-05 DIAGNOSIS — R0989 Other specified symptoms and signs involving the circulatory and respiratory systems: Secondary | ICD-10-CM | POA: Diagnosis not present

## 2017-03-05 DIAGNOSIS — J810 Acute pulmonary edema: Secondary | ICD-10-CM | POA: Diagnosis not present

## 2017-03-05 LAB — COMPREHENSIVE METABOLIC PANEL
ALT: 15 U/L (ref 0–35)
AST: 16 U/L (ref 0–37)
Albumin: 3.4 g/dL — ABNORMAL LOW (ref 3.5–5.2)
Alkaline Phosphatase: 56 U/L (ref 39–117)
BILIRUBIN TOTAL: 0.6 mg/dL (ref 0.2–1.2)
BUN: 18 mg/dL (ref 6–23)
CO2: 28 meq/L (ref 19–32)
CREATININE: 1.01 mg/dL (ref 0.40–1.20)
Calcium: 9.1 mg/dL (ref 8.4–10.5)
Chloride: 106 mEq/L (ref 96–112)
GFR: 56.07 mL/min — ABNORMAL LOW (ref >60.00)
GLUCOSE: 136 mg/dL — AB (ref 70–99)
Potassium: 4.1 mEq/L (ref 3.5–5.1)
Sodium: 143 mEq/L (ref 135–145)
Total Protein: 6 g/dL (ref 6.0–8.3)

## 2017-03-05 LAB — CBC
HCT: 35.4 % — ABNORMAL LOW (ref 36.0–46.0)
Hemoglobin: 11.3 g/dL — ABNORMAL LOW (ref 12.0–15.0)
MCHC: 31.8 g/dL (ref 30.0–36.0)
MCV: 98.4 fl (ref 78.0–100.0)
Platelets: 332 10*3/uL (ref 150.0–400.0)
RBC: 3.6 Mil/uL — ABNORMAL LOW (ref 3.87–5.11)
RDW: 17 % — ABNORMAL HIGH (ref 11.5–15.5)
WBC: 6.8 10*3/uL (ref 4.0–10.5)

## 2017-03-05 LAB — TSH: TSH: 3.15 u[IU]/mL (ref 0.35–4.50)

## 2017-03-05 LAB — HEMOGLOBIN A1C: HEMOGLOBIN A1C: 6.9 % — AB (ref 4.6–6.5)

## 2017-03-05 MED ORDER — FUROSEMIDE 40 MG PO TABS: 40.0000 mg | ORAL_TABLET | Freq: Every day | ORAL | 0 refills | Status: DC

## 2017-03-05 NOTE — Addendum Note (Signed)
Addended by: Radene GunningWENDLING, NICHOLAS P on: 03/05/2017 03:31 PM   Modules accepted: Orders

## 2017-03-05 NOTE — Progress Notes (Addendum)
Chief Complaint  Patient presents with  . Follow-up    11 weeks on DM-pt states she feeling fatigued and SOB-x 3 weeks    Subjective: Patient is a 82 y.o. female here for fatigue and DOE. She is here with her granddaughter.   This issue started 3.5 weeks ago after a bout of diarrhea that has since resolved. The patient feels more fatigued that usual and also has been getting short of breath on physical exertion. She is unsure if she has gained weight and does not notice any swelling in her LE's. She was at the beach on vacation around 3 weeks ago and her diet was not the best. She also has a hx of CAD and MI for which she follows with cardiology. No other recent illness, fevers, diarrhea, N/V, sick contacts, or change in medications.  ROS: Heart: Denies chest pain  Lungs: Denies SOB   Family History  Problem Relation Age of Onset  . Stroke Father   . Coronary artery disease Unknown   . Heart disease Mother    Past Medical History:  Diagnosis Date  . Allergy   . CAD (coronary artery disease)   . Diabetes mellitus    type II  . Diabetic retinopathy    Dr Perley Jain @ [facility] (vitrectomy left eye)  . Gastroenteritis 02/13/2013  . History of GI bleed    secondary to heparin  . Hypertension   . LBP (low back pain)   . Muscle cramp 02/13/2013  . Myocardial infarction (HCC)   . Right hip pain 02/13/2013  . Shingles   . Tubal pregnancy    Allergies  Allergen Reactions  . Sulfa Antibiotics Hives  . Cyclobenzaprine Hcl Rash    Current Outpatient Prescriptions:  .  amLODipine-valsartan (EXFORGE) 5-320 MG tablet, Take 1 tablet by mouth daily., Disp: 90 tablet, Rfl: 1 .  aspirin 81 MG tablet, Take 1 tablet (81 mg total) by mouth daily., Disp: 90 tablet, Rfl: 1 .  atorvastatin (LIPITOR) 40 MG tablet, Take 1 tablet (40 mg total) by mouth daily., Disp: 90 tablet, Rfl: 1 .  clopidogrel (PLAVIX) 75 MG tablet, TAKE 1 TABLET(75 MG) BY MOUTH EVERY MORNING, Disp: 90 tablet, Rfl: 1 .  gabapentin  (NEURONTIN) 300 MG capsule, Take 1 capsule (300 mg total) by mouth 3 (three) times daily., Disp: 90 capsule, Rfl: 0 .  glucose blood test strip, Use as instructed to check blood sugar 3 times per day Diagnoses Code 250.00, Disp: 100 each, Rfl: 12 .  INVOKANA 100 MG TABS tablet, TAKE 1 TABLET BY MOUTH EVERY MORNING WITH BREAKFAST, Disp: 30 tablet, Rfl: 5 .  Lancets (FREESTYLE) lancets, Use as instructed to check blood sugar 3 times per day Diagnoses Code 250.00, Disp: 300 each, Rfl: prn .  metFORMIN (GLUCOPHAGE-XR) 750 MG 24 hr tablet, TAKE 1 TABLET BY MOUTH EVERY MORNING WITH BREAKFAST, Disp: 30 tablet, Rfl: 5 .  methocarbamol (ROBAXIN) 500 MG tablet, Take 1 tablet (500 mg total) by mouth 2 (two) times daily as needed for muscle spasms., Disp: 20 tablet, Rfl: 0 .  metoprolol (LOPRESSOR) 50 MG tablet, Take 0.5 tablets (25 mg total) by mouth 2 (two) times daily., Disp: 90 tablet, Rfl: 1 .  nitroGLYCERIN (NITROSTAT) 0.4 MG SL tablet, Place 1 tablet (0.4 mg total) under the tongue every 5 (five) minutes as needed for chest pain., Disp: 15 tablet, Rfl: 0 .  PATADAY 0.2 % SOLN, Place 1 drop into both eyes daily., Disp: , Rfl:  .  pioglitazone (  ACTOS) 30 MG tablet, Take 1 tablet by mouth every day., Disp: 30 tablet, Rfl: 5 .  tacrolimus (PROTOPIC) 0.03 % ointment, Apply topically 2 (two) times daily. Apply to eyelids 1-2 times daily as needed., Disp: , Rfl:  .  triamcinolone cream (KENALOG) 0.1 %, Apply to affect area as needed., Disp: , Rfl:  .  furosemide (LASIX) 40 MG tablet, Take 1 tablet (40 mg total) by mouth daily., Disp: 30 tablet, Rfl: 0  Objective: BP (!) 140/56 (BP Location: Left Arm, Patient Position: Sitting, Cuff Size: Normal)   Pulse 73   Temp (!) 97.5 F (36.4 C) (Oral)   Ht 5\' 1"  (1.549 m)   Wt 231 lb 9.6 oz (105.1 kg)   SpO2 95%   BMI 43.76 kg/m  General: Awake, appears stated age HEENT: MMM, EOMi Heart: RRR, no JVD, 3+ pitting edema b/l up to knees Lungs: CTAB, +bibasilar  rales, wheezes or rhonchi. No accessory muscle use Abd: Difficult exam 2/2 large panniculus; BS+, soft, NT, ND, no masses or organomegaly Psych: Age appropriate judgment and insight, normal affect and mood  Assessment and Plan: Shortness of breath - Plan: CBC, Comprehensive metabolic panel, Lactate Dehydrogenase (LDH), TSH  Controlled type 2 diabetes mellitus with retinopathy, without long-term current use of insulin, macular edema presence unspecified, unspecified laterality, unspecified retinopathy severity (HCC) - Plan: Hemoglobin A1c  Rales - Plan: DG Chest 2 View, ECHOCARDIOGRAM COMPLETE  Pleural effusion, bilateral - Plan: Ambulatory referral to Interventional Radiology, ECHOCARDIOGRAM COMPLETE  Acute pulmonary edema (HCC) - Plan: ECHOCARDIOGRAM COMPLETE, furosemide (LASIX) 40 MG tablet  Will check A1c and follow up pending results.  I decided to address the SOB at today's visit. Rales heard, pulmonary edema confirmed on x-ray. We'll start Lasix. Bilateral pleural effusion, will refer to interventional radiology. There is a reasonable chance this is related to poor cardiac output given her 13 lb weight gain, pulm edema and LE edema. Will check echo. If she starts having worsening SOB, chest pain or new symptoms, seek immediate care/call 911. Official x-ray read came back after the patient had left. I called her and personally spoke with her about the findings and plan moving forward. F/u pending above.  The patient voiced understanding and agreement to the plan.  Jilda Rocheicholas Paul MonteagleWendling, DO 03/05/17  3:04 PM

## 2017-03-05 NOTE — Patient Instructions (Addendum)
Make an appt with your heart doctor as soon as able/convenient.  Do not do anything strenuous until you hear from us.  If your shortness of breath gets worse or you start having chest pain, call 911 or go to ER.  Let us know if you need anything.

## 2017-03-06 ENCOUNTER — Other Ambulatory Visit: Payer: Self-pay | Admitting: Family Medicine

## 2017-03-06 DIAGNOSIS — J90 Pleural effusion, not elsewhere classified: Secondary | ICD-10-CM

## 2017-03-06 LAB — LACTATE DEHYDROGENASE: LDH: 240 U/L (ref 120–250)

## 2017-03-09 ENCOUNTER — Other Ambulatory Visit: Payer: Self-pay | Admitting: Family Medicine

## 2017-03-09 ENCOUNTER — Telehealth: Payer: Self-pay | Admitting: *Deleted

## 2017-03-09 DIAGNOSIS — D649 Anemia, unspecified: Secondary | ICD-10-CM

## 2017-03-09 NOTE — Telephone Encounter (Signed)
Rx's approved and sent to the pharmacy by e-script.//AB/CMA 

## 2017-03-09 NOTE — Telephone Encounter (Signed)
-----   Message from Sharlene DoryNicholas Paul [name], DO sent at 03/06/2017  2:51 PM EDT ----- Normocytic anemia w increased RDW. A1c stable. Other labs WNL.  AB- let pt know her diabetes continues to be well controlled. Her blood count is a lot lower than before. I would like to recheck a CBC w diff in 1 week, please schedule and order. She does not need to be fasting. TY.

## 2017-03-09 NOTE — Telephone Encounter (Signed)
Called and spoke with the pt and informed her of recent lab results and note.  Pt verbalized understanding and agreed.  Pt was scheduled a lab appt for (Thur-03/12/17 @ 10:15am).  Future lab ordered and sent.//AB/CMA

## 2017-03-11 ENCOUNTER — Telehealth: Payer: Self-pay | Admitting: *Deleted

## 2017-03-11 ENCOUNTER — Other Ambulatory Visit: Payer: Self-pay | Admitting: Family Medicine

## 2017-03-11 ENCOUNTER — Ambulatory Visit (HOSPITAL_COMMUNITY)
Admission: RE | Admit: 2017-03-11 | Discharge: 2017-03-11 | Disposition: A | Discharge status: Home / Self Care | Payer: Medicare Other | Source: Ambulatory Visit | Attending: Family Medicine | Admitting: Family Medicine

## 2017-03-11 ENCOUNTER — Ambulatory Visit (HOSPITAL_COMMUNITY): Payer: Medicare Other

## 2017-03-11 ENCOUNTER — Ambulatory Visit (HOSPITAL_BASED_OUTPATIENT_CLINIC_OR_DEPARTMENT_OTHER)
Admission: RE | Admit: 2017-03-11 | Discharge: 2017-03-11 | Disposition: A | Discharge status: Home / Self Care | Payer: Medicare Other | Source: Ambulatory Visit | Attending: Family Medicine | Admitting: Family Medicine

## 2017-03-11 DIAGNOSIS — Z5309 Procedure and treatment not carried out because of other contraindication: Secondary | ICD-10-CM | POA: Diagnosis not present

## 2017-03-11 DIAGNOSIS — J90 Pleural effusion, not elsewhere classified: Secondary | ICD-10-CM

## 2017-03-11 DIAGNOSIS — J810 Acute pulmonary edema: Secondary | ICD-10-CM | POA: Diagnosis not present

## 2017-03-11 DIAGNOSIS — R0989 Other specified symptoms and signs involving the circulatory and respiratory systems: Secondary | ICD-10-CM | POA: Diagnosis not present

## 2017-03-11 DIAGNOSIS — I071 Rheumatic tricuspid insufficiency: Secondary | ICD-10-CM | POA: Diagnosis not present

## 2017-03-11 MED ORDER — LIDOCAINE HCL (PF) 1 % IJ SOLN
INTRAMUSCULAR | Status: AC
  Filled 2017-03-11: qty 30

## 2017-03-11 NOTE — Progress Notes (Signed)
Patient ID: Elizabeth DickensSonja S Bogue, female   DOB: [birth year], 82 y.o.   MRN: [id number] No effusion noted in left chest.  Minimal effusion in right chest, but too small to safely drain.  Procedure not done.  Zsofia Prout E 1:30 PM 03/11/2017

## 2017-03-11 NOTE — Telephone Encounter (Signed)
I would like her to come back eventually for a diabetic exam in the next 1-2 weeks. TY.

## 2017-03-11 NOTE — Telephone Encounter (Signed)
-----   Message from Sharlene DoryNicholas Paul [name], DO sent at 03/11/2017  4:00 PM EDT ----- Let pt know that Echo is not strongly supportive of congestive heart failure and looks pretty normal for someone of her age. I would like to see her this week or early next week if she is still having issues. TY.

## 2017-03-11 NOTE — Telephone Encounter (Signed)
Called and spoke with the pt and informed her of the recent Echo results and note.  She verbalized understanding and stated that she went for the procedure today.  She had a Sonogram done,but not the procedure because she was told by the nurse that they did not see enough fluid to do the procedure.  Pt stated that she is feeling better.  Pt wanted to know if she does not need to come back this week or next week.  When does she need to come back. Please advise.//AB/CMA

## 2017-03-11 NOTE — Progress Notes (Signed)
  Echocardiogram 2D Echocardiogram has been performed.  Elizabeth SavoyCasey N Analycia [name] 03/11/2017, 11:25 AM

## 2017-03-12 ENCOUNTER — Other Ambulatory Visit (INDEPENDENT_AMBULATORY_CARE_PROVIDER_SITE_OTHER): Payer: Medicare Other

## 2017-03-12 DIAGNOSIS — D649 Anemia, unspecified: Secondary | ICD-10-CM | POA: Diagnosis not present

## 2017-03-12 LAB — CBC WITH DIFFERENTIAL/PLATELET
Basophils Absolute: 0 10*3/uL (ref 0.0–0.1)
Basophils Relative: 0.5 % (ref 0.0–3.0)
EOS PCT: 3.6 % (ref 0.0–5.0)
Eosinophils Absolute: 0.2 10*3/uL (ref 0.0–0.7)
HEMATOCRIT: 35.2 % — AB (ref 36.0–46.0)
HEMOGLOBIN: 11.2 g/dL — AB (ref 12.0–15.0)
LYMPHS PCT: 26.7 % (ref 12.0–46.0)
Lymphs Abs: 1.4 10*3/uL (ref 0.7–4.0)
MCHC: 31.7 g/dL (ref 30.0–36.0)
MCV: 97.4 fl (ref 78.0–100.0)
MONOS PCT: 6.9 % (ref 3.0–12.0)
Monocytes Absolute: 0.4 10*3/uL (ref 0.1–1.0)
Neutro Abs: 3.2 10*3/uL (ref 1.4–7.7)
Neutrophils Relative %: 62.3 % (ref 43.0–77.0)
Platelets: 314 10*3/uL (ref 150.0–400.0)
RBC: 3.62 Mil/uL — AB (ref 3.87–5.11)
RDW: 17.6 % — ABNORMAL HIGH (ref 11.5–15.5)
WBC: 5.2 10*3/uL (ref 4.0–10.5)

## 2017-03-12 NOTE — Telephone Encounter (Signed)
Spoke with the pt and informed her of the message below.  Pt verbalized understanding and agreed.//AB/CMA

## 2017-03-15 ENCOUNTER — Other Ambulatory Visit: Payer: Self-pay | Admitting: Family Medicine

## 2017-03-16 NOTE — Telephone Encounter (Signed)
Rx approved.  Rx sent to the pharmacy by e-script.//AB/CMA

## 2017-03-19 ENCOUNTER — Other Ambulatory Visit (HOSPITAL_BASED_OUTPATIENT_CLINIC_OR_DEPARTMENT_OTHER): Payer: Medicare Other

## 2017-03-26 ENCOUNTER — Encounter: Payer: Self-pay | Admitting: Family Medicine

## 2017-03-26 ENCOUNTER — Ambulatory Visit (INDEPENDENT_AMBULATORY_CARE_PROVIDER_SITE_OTHER): Payer: Medicare Other | Admitting: Family Medicine

## 2017-03-26 VITALS — BP 136/50 | HR 70 | Temp 97.6°F | Ht 61.0 in | Wt 220.8 lb

## 2017-03-26 DIAGNOSIS — E11319 Type 2 diabetes mellitus with unspecified diabetic retinopathy without macular edema: Secondary | ICD-10-CM | POA: Diagnosis not present

## 2017-03-26 DIAGNOSIS — I10 Essential (primary) hypertension: Secondary | ICD-10-CM | POA: Diagnosis not present

## 2017-03-26 NOTE — Patient Instructions (Signed)
Your A1c is 6.9.  Keep walking.  Let us know that you want 90 day supplies next time you call for a refill.   Let us know if you need anything.

## 2017-03-26 NOTE — Progress Notes (Signed)
Subjective:   Chief Complaint  Patient presents with  . Follow-up    2 weeks on SOB-pt states she is doing alot better    Elizabeth [name] is a 82 y.o. female here for follow-up of diabetes.   Elizabeth [name]'s self monitored glucose range is low 100's.  Patient denies hypoglycemic reactions. She checks her glucose levels 2-3 times per week Patient does not require insulin.   Medications include: Metformin 750 mg XL, Actos 30 mg daily, Invokana 100 mg daily; tolerating them all well Patient exercises 7 days per week on average.   She does take an aspirin daily. Statin? Yes ACEi/ARB? Yes  Hypertension Patient presents for hypertension follow up. She does monitor home blood pressures. Blood pressures ranging on average from 130's/70's. She is compliant with medications- Exforge 5-320 mg, lopressor 25 mg daily. Patient has these side effects of medication: none She is adhering to a healthy diet overall. Exercise: tries to walk and stay physically active around house every day   Past Medical History:  Diagnosis Date  . Allergy   . CAD (coronary artery disease)   . Diabetes mellitus    type II  . Diabetic retinopathy    Dr Perley Jain @ [facility] (vitrectomy left eye)  . Gastroenteritis 02/13/2013  . History of GI bleed    secondary to heparin  . Hypertension   . LBP (low back pain)   . Muscle cramp 02/13/2013  . Myocardial infarction (HCC)   . Right hip pain 02/13/2013  . Shingles   . Tubal pregnancy     Past Surgical History:  Procedure Laterality Date  . APPENDECTOMY    . CATARACT EXTRACTION    . EYE SURGERY    . LAMINOTOMY     Dr Jordan Likes L3-4  and microdiskectomy  . TONSILLECTOMY    . TUBAL LIGATION      Social History   Social History  . Marital status: Married   Occupational History  . Retired Retired   Social History Main Topics  . Smoking status: Never Smoker  . Smokeless tobacco: Never Used  . Alcohol use No  . Drug use: No  . Sexual activity: No    Current  Outpatient Prescriptions on File Prior to Visit  Medication Sig Dispense Refill  . amLODipine-valsartan (EXFORGE) 5-320 MG tablet Take 1 tablet by mouth daily. 90 tablet 1  . ASPIRIN LOW DOSE 81 MG EC tablet TAKE 1 TABLET BY MOUTH DAILY 90 tablet 1  . atorvastatin (LIPITOR) 40 MG tablet TAKE 1 TABLET(40 MG) BY MOUTH DAILY 90 tablet 1  . clopidogrel (PLAVIX) 75 MG tablet TAKE 1 TABLET(75 MG) BY MOUTH EVERY MORNING 90 tablet 1  . furosemide (LASIX) 40 MG tablet Take 1 tablet (40 mg total) by mouth daily. 30 tablet 0  . gabapentin (NEURONTIN) 300 MG capsule Take 1 capsule (300 mg total) by mouth 3 (three) times daily. 90 capsule 0  . glucose blood test strip Use as instructed to check blood sugar 3 times per day Diagnoses Code 250.00 100 each 12  . INVOKANA 100 MG TABS tablet TAKE 1 TABLET BY MOUTH EVERY MORNING WITH BREAKFAST 30 tablet 5  . Lancets (FREESTYLE) lancets Use as instructed to check blood sugar 3 times per day Diagnoses Code 250.00 300 each prn  . metFORMIN (GLUCOPHAGE-XR) 750 MG 24 hr tablet TAKE 1 TABLET BY MOUTH EVERY MORNING WITH BREAKFAST 30 tablet 5  . methocarbamol (ROBAXIN) 500 MG tablet Take 1 tablet (500 mg total) by mouth 2 (  two) times daily as needed for muscle spasms. 20 tablet 0  . metoprolol tartrate (LOPRESSOR) 50 MG tablet TAKE 1/2 TABLET(25 MG) BY MOUTH TWICE DAILY 90 tablet 1  . nitroGLYCERIN (NITROSTAT) 0.4 MG SL tablet Place 1 tablet (0.4 mg total) under the tongue every 5 (five) minutes as needed for chest pain. 15 tablet 0  . PATADAY 0.2 % SOLN Place 1 drop into both eyes daily.    . pioglitazone (ACTOS) 30 MG tablet Take 1 tablet by mouth every day. 30 tablet 5  . tacrolimus (PROTOPIC) 0.03 % ointment Apply topically 2 (two) times daily. Apply to eyelids 1-2 times daily as needed.    . triamcinolone cream (KENALOG) 0.1 % Apply to affect area as needed.     Related testing: Date of retinal exam: Sept 2018  Done by:  Dr. Leonia CoronaGrevin Pneumovax: done Flu Shot:  done  Review of Systems: Eye:  No recent significant change in vision Pulmonary:  No SOB Cardiovascular:  No chest pain, no palpitations Skin/Integumentary ROS:  No abnormal skin lesions reported Neurologic:  No numbness, tingling  Objective:  BP (!) 136/50 (BP Location: Left Arm, Patient Position: Sitting, Cuff Size: Normal)   Pulse 70   Temp 97.6 F (36.4 C) (Oral)   Ht 5\' 1"  (1.549 m)   Wt 220 lb 12.8 oz (100.2 kg)   SpO2 98%   BMI 41.72 kg/m  General:  Well developed, well nourished, in no apparent distress Head:  Normocephalic, atraumatic Eyes:  Pupil on L not reactive to light, R side reactive Throat/Pharynx:  Lips and gingiva without lesion Neck: Neck supple.  No obvious thyromegaly or masses.  No bruits Lungs:  clear to auscultation, breath sounds equal bilaterally, no wheezes, rales, or stridor Cardio:  regular rate and rhythm, 2+ LE pitting edema b/l Psych: Age appropriate judgment and insight  Assessment:   Controlled type 2 diabetes mellitus with retinopathy, without long-term current use of insulin, macular edema presence unspecified, unspecified laterality, unspecified retinopathy severity (HCC)  Essential hypertension   Plan:   Orders as above. Cont current regimen for HTN and DM. Counseled on diet and exercise. She feels much better since I saw her last, she looks better also.  F/u in 5 mo. The patient voiced understanding and agreement to the plan.  Jilda Rocheicholas Paul PattisonWendling, DO 03/26/17 10:47 AM

## 2017-05-26 ENCOUNTER — Ambulatory Visit: Payer: Medicare Other

## 2017-05-27 ENCOUNTER — Ambulatory Visit (HOSPITAL_BASED_OUTPATIENT_CLINIC_OR_DEPARTMENT_OTHER)
Admission: RE | Admit: 2017-05-27 | Discharge: 2017-05-27 | Disposition: A | Discharge status: Home / Self Care | Payer: Medicare Other | Source: Ambulatory Visit | Attending: Family Medicine | Admitting: Family Medicine

## 2017-05-27 ENCOUNTER — Ambulatory Visit (INDEPENDENT_AMBULATORY_CARE_PROVIDER_SITE_OTHER): Payer: Medicare Other | Admitting: Family Medicine

## 2017-05-27 ENCOUNTER — Encounter: Payer: Self-pay | Admitting: Family Medicine

## 2017-05-27 VITALS — BP 122/62 | HR 87 | Temp 98.2°F | Ht 61.0 in | Wt 228.1 lb

## 2017-05-27 DIAGNOSIS — R918 Other nonspecific abnormal finding of lung field: Secondary | ICD-10-CM | POA: Diagnosis not present

## 2017-05-27 DIAGNOSIS — R0989 Other specified symptoms and signs involving the circulatory and respiratory systems: Secondary | ICD-10-CM | POA: Insufficient documentation

## 2017-05-27 DIAGNOSIS — J90 Pleural effusion, not elsewhere classified: Secondary | ICD-10-CM | POA: Insufficient documentation

## 2017-05-27 DIAGNOSIS — R0609 Other forms of dyspnea: Secondary | ICD-10-CM | POA: Diagnosis not present

## 2017-05-27 DIAGNOSIS — Z23 Encounter for immunization: Secondary | ICD-10-CM | POA: Diagnosis not present

## 2017-05-27 NOTE — Progress Notes (Signed)
Chief Complaint  Patient presents with  . Shortness of Breath  . Cough    Subjective: Patient is a 82 y.o. female here for shortness of breath.  This has been going on for around 1 week. Saw cardiologist around 2-3 weeks ago and was placed on every other day diuretic. Feels better on days she does take water pill. She has been known to have softer foods over the past couple weeks. She is unsure she is gaining weight. Today in the scale she is a pounds heavier than when I saw her last. She is having a cough, coughing up frothy-like sputum. She denies any fevers, sick contacts, upper respiratory symptoms, or chest pain.   ROS: Heart: Denies chest pain  Lungs: + SOB   Family History  Problem Relation Age of Onset  . Stroke Father   . Coronary artery disease Unknown   . Heart disease Mother    Past Medical History:  Diagnosis Date  . Allergy   . CAD (coronary artery disease)   . Diabetes mellitus    type II  . Diabetic retinopathy    Dr Perley Jain @ [facility] (vitrectomy left eye)  . Gastroenteritis 02/13/2013  . History of GI bleed    secondary to heparin  . Hypertension   . LBP (low back pain)   . Muscle cramp 02/13/2013  . Myocardial infarction (HCC)   . Right hip pain 02/13/2013  . Shingles   . Tubal pregnancy    Allergies  Allergen Reactions  . Sulfa Antibiotics Hives  . Cyclobenzaprine Hcl Rash    Current Outpatient Prescriptions:  .  amLODipine-valsartan (EXFORGE) 5-320 MG tablet, Take 1 tablet by mouth daily., Disp: 90 tablet, Rfl: 1 .  ASPIRIN LOW DOSE 81 MG EC tablet, TAKE 1 TABLET BY MOUTH DAILY, Disp: 90 tablet, Rfl: 1 .  atorvastatin (LIPITOR) 40 MG tablet, TAKE 1 TABLET(40 MG) BY MOUTH DAILY, Disp: 90 tablet, Rfl: 1 .  clopidogrel (PLAVIX) 75 MG tablet, TAKE 1 TABLET(75 MG) BY MOUTH EVERY MORNING, Disp: 90 tablet, Rfl: 1 .  furosemide (LASIX) 20 MG tablet, Take 1 tablet (20 mg total) by mouth daily. Take every other day, Disp: 30 tablet, Rfl:  .  gabapentin  (NEURONTIN) 300 MG capsule, Take 1 capsule (300 mg total) by mouth 3 (three) times daily., Disp: 90 capsule, Rfl: 0 .  glucose blood test strip, Use as instructed to check blood sugar 3 times per day Diagnoses Code 250.00, Disp: 100 each, Rfl: 12 .  INVOKANA 100 MG TABS tablet, TAKE 1 TABLET BY MOUTH EVERY MORNING WITH BREAKFAST, Disp: 30 tablet, Rfl: 5 .  Lancets (FREESTYLE) lancets, Use as instructed to check blood sugar 3 times per day Diagnoses Code 250.00, Disp: 300 each, Rfl: prn .  metFORMIN (GLUCOPHAGE-XR) 750 MG 24 hr tablet, TAKE 1 TABLET BY MOUTH EVERY MORNING WITH BREAKFAST, Disp: 30 tablet, Rfl: 5 .  methocarbamol (ROBAXIN) 500 MG tablet, Take 1 tablet (500 mg total) by mouth 2 (two) times daily as needed for muscle spasms., Disp: 20 tablet, Rfl: 0 .  metoprolol tartrate (LOPRESSOR) 50 MG tablet, TAKE 1/2 TABLET(25 MG) BY MOUTH TWICE DAILY, Disp: 90 tablet, Rfl: 1 .  nitroGLYCERIN (NITROSTAT) 0.4 MG SL tablet, Place 1 tablet (0.4 mg total) under the tongue every 5 (five) minutes as needed for chest pain., Disp: 15 tablet, Rfl: 0 .  PATADAY 0.2 % SOLN, Place 1 drop into both eyes daily., Disp: , Rfl:  .  pioglitazone (ACTOS) 30 MG tablet,  Take 1 tablet by mouth every day., Disp: 30 tablet, Rfl: 5 .  tacrolimus (PROTOPIC) 0.03 % ointment, Apply topically 2 (two) times daily. Apply to eyelids 1-2 times daily as needed., Disp: , Rfl:  .  triamcinolone cream (KENALOG) 0.1 %, Apply to affect area as needed., Disp: , Rfl:   Objective: BP 122/62 (BP Location: Right Arm, Patient Position: Sitting, Cuff Size: Large)   Pulse 87   Temp 98.2 F (36.8 C) (Oral)   Ht  (1.549 m)   Wt 228 lb 2 oz (103.5 kg)   SpO2 96%   BMI 43.10 kg/m  General: Awake, appears stated age HEENT: MMM, EOMi Heart: RRR, +SEM, 2+ pitting edema b/l, tapering up to knees Lungs: +rales heard in bilateral bases. No accessory muscle use Psych: Age appropriate judgment and insight, normal affect and  mood  Assessment and Plan: Rales - Plan: furosemide (LASIX) 20 MG tablet, DG Chest 2 View, Basic metabolic panel  Dyspnea on exertion  Need for influenza vaccination - Plan: Flu vaccine HIGH DOSE PF (Fluzone High dose)  Orders as above. CXR shows pulm edema and small pleural effusions. Increased frequency of Lasix to daily instead of QOD. Check bmp for K level, may need to add supplement given her use.  F.u in 5 days to recheck breathing.  The patient voiced understanding and agreement to the plan.  Jilda Roche [place], DO 05/27/17  3:50 PM

## 2017-05-27 NOTE — Patient Instructions (Signed)
Take Lasix daily for now. Mind your salt intake. Stay physically active as able.   If things get worse, consider going to ER vs letting us know.

## 2017-05-27 NOTE — Addendum Note (Signed)
Addended by: Verdie Shire on: 05/27/2017 05:06 PM   Modules accepted: Orders

## 2017-05-27 NOTE — Progress Notes (Signed)
Pre visit review using our clinic review tool, if applicable. No additional management support is needed unless otherwise documented below in the visit note. 

## 2017-05-28 ENCOUNTER — Other Ambulatory Visit (INDEPENDENT_AMBULATORY_CARE_PROVIDER_SITE_OTHER): Payer: Medicare Other

## 2017-05-28 DIAGNOSIS — R0989 Other specified symptoms and signs involving the circulatory and respiratory systems: Secondary | ICD-10-CM | POA: Diagnosis not present

## 2017-05-28 LAB — BASIC METABOLIC PANEL
BUN: 22 mg/dL (ref 6–23)
CO2: 29 mEq/L (ref 19–32)
CREATININE: 1.18 mg/dL (ref 0.40–1.20)
Calcium: 8.6 mg/dL (ref 8.4–10.5)
Chloride: 105 mEq/L (ref 96–112)
GFR: 46.83 mL/min — AB (ref >60.00)
GLUCOSE: 126 mg/dL — AB (ref 70–99)
Potassium: 3.4 mEq/L — ABNORMAL LOW (ref 3.5–5.1)
Sodium: 144 mEq/L (ref 135–145)

## 2017-05-29 ENCOUNTER — Telehealth: Payer: Self-pay | Admitting: Family Medicine

## 2017-05-29 MED ORDER — POTASSIUM CHLORIDE CRYS ER 10 MEQ PO TBCR: 10.0000 meq | EXTENDED_RELEASE_TABLET | Freq: Two times a day (BID) | ORAL | 1 refills | Status: DC

## 2017-05-29 NOTE — Telephone Encounter (Signed)
Zandra called to see if you could possible send this to the [facility] on [address] in [place] because her regular pharmacy is closed due to no power and the other pharmacy can not see the prescription?

## 2017-05-29 NOTE — Addendum Note (Signed)
Addended by: Scharlene Gloss B on: 05/29/2017 10:29 AM   Modules accepted: Orders

## 2017-05-29 NOTE — Telephone Encounter (Signed)
Sent to the [address] [facility]

## 2017-05-31 ENCOUNTER — Other Ambulatory Visit: Payer: Self-pay | Admitting: Family Medicine

## 2017-05-31 DIAGNOSIS — I10 Essential (primary) hypertension: Secondary | ICD-10-CM

## 2017-06-01 ENCOUNTER — Ambulatory Visit (INDEPENDENT_AMBULATORY_CARE_PROVIDER_SITE_OTHER): Payer: Medicare Other | Admitting: Family Medicine

## 2017-06-01 ENCOUNTER — Inpatient Hospital Stay (HOSPITAL_BASED_OUTPATIENT_CLINIC_OR_DEPARTMENT_OTHER)
Admission: EM | Admit: 2017-06-01 | Discharge: 2017-06-04 | DRG: 291 | Disposition: A | Discharge status: Home Health | Payer: Medicare Other | Attending: Internal Medicine | Admitting: Internal Medicine

## 2017-06-01 ENCOUNTER — Encounter: Payer: Self-pay | Admitting: Family Medicine

## 2017-06-01 ENCOUNTER — Emergency Department (HOSPITAL_BASED_OUTPATIENT_CLINIC_OR_DEPARTMENT_OTHER): Payer: Medicare Other

## 2017-06-01 ENCOUNTER — Encounter (HOSPITAL_BASED_OUTPATIENT_CLINIC_OR_DEPARTMENT_OTHER): Payer: Self-pay

## 2017-06-01 VITALS — BP 118/82 | HR 82 | Temp 98.4°F | Ht 61.0 in | Wt 228.0 lb

## 2017-06-01 DIAGNOSIS — E119 Type 2 diabetes mellitus without complications: Secondary | ICD-10-CM

## 2017-06-01 DIAGNOSIS — I130 Hypertensive heart and chronic kidney disease with heart failure and stage 1 through stage 4 chronic kidney disease, or unspecified chronic kidney disease: Secondary | ICD-10-CM | POA: Diagnosis not present

## 2017-06-01 DIAGNOSIS — I10 Essential (primary) hypertension: Secondary | ICD-10-CM | POA: Diagnosis present

## 2017-06-01 DIAGNOSIS — Z7902 Long term (current) use of antithrombotics/antiplatelets: Secondary | ICD-10-CM

## 2017-06-01 DIAGNOSIS — R0609 Other forms of dyspnea: Secondary | ICD-10-CM

## 2017-06-01 DIAGNOSIS — E11319 Type 2 diabetes mellitus with unspecified diabetic retinopathy without macular edema: Secondary | ICD-10-CM | POA: Diagnosis present

## 2017-06-01 DIAGNOSIS — I2510 Atherosclerotic heart disease of native coronary artery without angina pectoris: Secondary | ICD-10-CM | POA: Diagnosis present

## 2017-06-01 DIAGNOSIS — D649 Anemia, unspecified: Secondary | ICD-10-CM | POA: Diagnosis present

## 2017-06-01 DIAGNOSIS — E669 Obesity, unspecified: Secondary | ICD-10-CM

## 2017-06-01 DIAGNOSIS — I5033 Acute on chronic diastolic (congestive) heart failure: Secondary | ICD-10-CM | POA: Diagnosis not present

## 2017-06-01 DIAGNOSIS — I5031 Acute diastolic (congestive) heart failure: Secondary | ICD-10-CM | POA: Diagnosis present

## 2017-06-01 DIAGNOSIS — Z881 Allergy status to other antibiotic agents status: Secondary | ICD-10-CM

## 2017-06-01 DIAGNOSIS — Z7984 Long term (current) use of oral hypoglycemic drugs: Secondary | ICD-10-CM

## 2017-06-01 DIAGNOSIS — Z7982 Long term (current) use of aspirin: Secondary | ICD-10-CM

## 2017-06-01 DIAGNOSIS — R0989 Other specified symptoms and signs involving the circulatory and respiratory systems: Secondary | ICD-10-CM

## 2017-06-01 DIAGNOSIS — E1169 Type 2 diabetes mellitus with other specified complication: Secondary | ICD-10-CM

## 2017-06-01 DIAGNOSIS — N3289 Other specified disorders of bladder: Secondary | ICD-10-CM | POA: Diagnosis present

## 2017-06-01 DIAGNOSIS — Z79899 Other long term (current) drug therapy: Secondary | ICD-10-CM

## 2017-06-01 DIAGNOSIS — Z981 Arthrodesis status: Secondary | ICD-10-CM

## 2017-06-01 DIAGNOSIS — J309 Allergic rhinitis, unspecified: Secondary | ICD-10-CM | POA: Diagnosis present

## 2017-06-01 DIAGNOSIS — R0602 Shortness of breath: Secondary | ICD-10-CM

## 2017-06-01 DIAGNOSIS — I252 Old myocardial infarction: Secondary | ICD-10-CM

## 2017-06-01 DIAGNOSIS — R0902 Hypoxemia: Secondary | ICD-10-CM | POA: Diagnosis present

## 2017-06-01 DIAGNOSIS — D539 Nutritional anemia, unspecified: Secondary | ICD-10-CM | POA: Diagnosis present

## 2017-06-01 DIAGNOSIS — E876 Hypokalemia: Secondary | ICD-10-CM | POA: Diagnosis present

## 2017-06-01 DIAGNOSIS — E1140 Type 2 diabetes mellitus with diabetic neuropathy, unspecified: Secondary | ICD-10-CM | POA: Diagnosis present

## 2017-06-01 DIAGNOSIS — N183 Chronic kidney disease, stage 3 (moderate): Secondary | ICD-10-CM | POA: Diagnosis present

## 2017-06-01 DIAGNOSIS — E1122 Type 2 diabetes mellitus with diabetic chronic kidney disease: Secondary | ICD-10-CM | POA: Diagnosis present

## 2017-06-01 DIAGNOSIS — Z8249 Family history of ischemic heart disease and other diseases of the circulatory system: Secondary | ICD-10-CM

## 2017-06-01 LAB — CBC
HCT: 31.6 % — ABNORMAL LOW (ref 36.0–46.0)
Hemoglobin: 9.6 g/dL — ABNORMAL LOW (ref 12.0–15.0)
MCH: 30.2 pg (ref 26.0–34.0)
MCHC: 30.4 g/dL (ref 30.0–36.0)
MCV: 99.4 fL (ref 78.0–100.0)
PLATELETS: 300 10*3/uL (ref 150–400)
RBC: 3.18 MIL/uL — ABNORMAL LOW (ref 3.87–5.11)
RDW: 16.2 % — AB (ref 11.5–15.5)
WBC: 9.6 10*3/uL (ref 4.0–10.5)

## 2017-06-01 LAB — GLUCOSE, CAPILLARY: Glucose-Capillary: 122 mg/dL — ABNORMAL HIGH (ref 65–99)

## 2017-06-01 LAB — COMPREHENSIVE METABOLIC PANEL
ALT: 21 U/L (ref 14–54)
ANION GAP: 8 (ref 5–15)
AST: 25 U/L (ref 15–41)
Albumin: 3.3 g/dL — ABNORMAL LOW (ref 3.5–5.0)
Alkaline Phosphatase: 59 U/L (ref 38–126)
BUN: 24 mg/dL — ABNORMAL HIGH (ref 6–20)
CHLORIDE: 105 mmol/L (ref 101–111)
CO2: 28 mmol/L (ref 22–32)
Calcium: 8.7 mg/dL — ABNORMAL LOW (ref 8.9–10.3)
Creatinine, Ser: 1.09 mg/dL — ABNORMAL HIGH (ref 0.44–1.00)
GFR, EST AFRICAN AMERICAN: 54 mL/min — AB (ref >60)
GFR, EST NON AFRICAN AMERICAN: 47 mL/min — AB (ref >60)
Glucose, Bld: 158 mg/dL — ABNORMAL HIGH (ref 65–99)
POTASSIUM: 3.9 mmol/L (ref 3.5–5.1)
Sodium: 141 mmol/L (ref 135–145)
Total Bilirubin: 0.7 mg/dL (ref 0.3–1.2)
Total Protein: 6.6 g/dL (ref 6.5–8.1)

## 2017-06-01 LAB — BRAIN NATRIURETIC PEPTIDE: B NATRIURETIC PEPTIDE 5: 277.2 pg/mL — AB (ref 0.0–100.0)

## 2017-06-01 LAB — TROPONIN I

## 2017-06-01 MED ORDER — ALBUTEROL SULFATE (2.5 MG/3ML) 0.083% IN NEBU: 5.0000 mg | INHALATION_SOLUTION | Freq: Once | RESPIRATORY_TRACT | Status: DC

## 2017-06-01 MED ORDER — FUROSEMIDE 10 MG/ML IJ SOLN
60.0000 mg | Freq: Once | INTRAMUSCULAR | Status: AC
  Administered 2017-06-01: 60 mg via INTRAVENOUS
  Filled 2017-06-01: qty 6

## 2017-06-01 MED ORDER — INSULIN ASPART 100 UNIT/ML ~~LOC~~ SOLN
0.0000 [IU] | Freq: Three times a day (TID) | SUBCUTANEOUS | Status: DC
  Administered 2017-06-02 (×2): 2 [IU] via SUBCUTANEOUS
  Administered 2017-06-03: 3 [IU] via SUBCUTANEOUS
  Administered 2017-06-03 – 2017-06-04 (×2): 2 [IU] via SUBCUTANEOUS

## 2017-06-01 MED ORDER — OXYBUTYNIN CHLORIDE 5 MG PO TABS
5.0000 mg | ORAL_TABLET | Freq: Three times a day (TID) | ORAL | Status: DC
  Administered 2017-06-01 – 2017-06-03 (×7): 5 mg via ORAL
  Filled 2017-06-01 (×7): qty 1

## 2017-06-01 MED ORDER — PHENAZOPYRIDINE HCL 100 MG PO TABS: 100.0000 mg | ORAL_TABLET | Freq: Three times a day (TID) | ORAL | Status: DC

## 2017-06-01 MED ORDER — ATORVASTATIN CALCIUM 40 MG PO TABS
40.0000 mg | ORAL_TABLET | Freq: Every day | ORAL | Status: DC
  Administered 2017-06-02 – 2017-06-03 (×2): 40 mg via ORAL
  Filled 2017-06-01 (×2): qty 1

## 2017-06-01 MED ORDER — SODIUM CHLORIDE 0.9% FLUSH
3.0000 mL | Freq: Two times a day (BID) | INTRAVENOUS | Status: DC
  Administered 2017-06-01 – 2017-06-03 (×5): 3 mL via INTRAVENOUS

## 2017-06-01 MED ORDER — ACETAMINOPHEN 325 MG PO TABS
650.0000 mg | ORAL_TABLET | ORAL | Status: DC | PRN
  Administered 2017-06-01: 650 mg via ORAL
  Filled 2017-06-01: qty 2

## 2017-06-01 MED ORDER — SODIUM CHLORIDE 0.9 % IV SOLN: 250.0000 mL | INTRAVENOUS | Status: DC | PRN

## 2017-06-01 MED ORDER — METHOCARBAMOL 500 MG PO TABS: 500.0000 mg | ORAL_TABLET | Freq: Two times a day (BID) | ORAL | Status: DC | PRN

## 2017-06-01 MED ORDER — FUROSEMIDE 10 MG/ML IJ SOLN
40.0000 mg | Freq: Two times a day (BID) | INTRAMUSCULAR | Status: DC
  Administered 2017-06-01 – 2017-06-02 (×2): 40 mg via INTRAVENOUS
  Filled 2017-06-01 (×2): qty 4

## 2017-06-01 MED ORDER — ONDANSETRON HCL 4 MG/2ML IJ SOLN: 4.0000 mg | Freq: Four times a day (QID) | INTRAMUSCULAR | Status: DC | PRN

## 2017-06-01 MED ORDER — SODIUM CHLORIDE 0.9% FLUSH: 3.0000 mL | INTRAVENOUS | Status: DC | PRN

## 2017-06-01 MED ORDER — ENOXAPARIN SODIUM 40 MG/0.4ML ~~LOC~~ SOLN
40.0000 mg | SUBCUTANEOUS | Status: DC
  Administered 2017-06-01 – 2017-06-03 (×3): 40 mg via SUBCUTANEOUS
  Filled 2017-06-01 (×3): qty 0.4

## 2017-06-01 MED ORDER — METOPROLOL TARTRATE 25 MG PO TABS
25.0000 mg | ORAL_TABLET | Freq: Two times a day (BID) | ORAL | Status: DC
  Administered 2017-06-02 – 2017-06-03 (×3): 25 mg via ORAL
  Filled 2017-06-01 (×3): qty 1

## 2017-06-01 MED ORDER — ASPIRIN EC 81 MG PO TBEC
81.0000 mg | DELAYED_RELEASE_TABLET | Freq: Every day | ORAL | Status: DC
  Administered 2017-06-02 – 2017-06-03 (×2): 81 mg via ORAL
  Filled 2017-06-01 (×3): qty 1

## 2017-06-01 MED ORDER — IRBESARTAN 300 MG PO TABS
300.0000 mg | ORAL_TABLET | Freq: Every day | ORAL | Status: DC
  Administered 2017-06-02: 300 mg via ORAL
  Filled 2017-06-01: qty 1

## 2017-06-01 MED ORDER — GABAPENTIN 300 MG PO CAPS: 300.0000 mg | ORAL_CAPSULE | Freq: Three times a day (TID) | ORAL | Status: DC

## 2017-06-01 MED ORDER — CLOPIDOGREL BISULFATE 75 MG PO TABS
75.0000 mg | ORAL_TABLET | Freq: Every day | ORAL | Status: DC
  Administered 2017-06-02 – 2017-06-03 (×2): 75 mg via ORAL
  Filled 2017-06-01 (×2): qty 1

## 2017-06-01 NOTE — Progress Notes (Signed)
Pre visit review using our clinic review tool, if applicable. No additional management support is needed unless otherwise documented below in the visit note. 

## 2017-06-01 NOTE — Patient Instructions (Signed)
Go to ER.  Let us know if you need anything.  

## 2017-06-01 NOTE — H&P (Signed)
History and Physical    Elizabeth [name] ZOX:[id number] DOB: 18-Oct-1934 DOA: 06/01/2017  PCP: Sharlene Dory, DO Consultants:  [facility] - cardiology, [place]; [place] - ophthalmology Patient coming from:  Home - lives with husband; NOK: daughter, [phone]  Chief Complaint: SOB  HPI: Elizabeth [name] is a 82 y.o. female with medical history significant of CAD; diastolic heart failure (grade 1 on 7/18 echo); HTN; DM; and visual disturbance presenting with SOB.  She has been on Lasix every other day and yet the SOB was ongoing.  They increased the Lasix from QOD to daily but it wasn't helping.  PCP sent her to [facility] and they gave her IV Lasix and arranged for transfer here.  +DOE.  She notices cramping in her bladder region with the Lasix and it is very uncomfortable.  Increased diuresis with the Lasix dose but ongoing SOB.  She is feeling better now with the oxygen.  Some cough, productive of frothy sputum.  No fevers.  She does not wear O2 at home.   ED Course:  Outpatient treatment failure for diastolic heart failure.  Transferred from [facility] to [facility].  Review of Systems: As per HPI; otherwise review of systems reviewed and negative.   Ambulatory Status:  Ambulates without assistance  Past Medical History:  Diagnosis Date  . Allergy   . CAD (coronary artery disease)   . Diabetes mellitus    type II  . Diabetic retinopathy    Dr Perley Jain @ [facility] (vitrectomy left eye)  . Gastroenteritis 02/13/2013  . History of GI bleed    secondary to heparin  . Hypertension   . LBP (low back pain)   . Muscle cramp 02/13/2013  . Pleural effusion   . Pulmonary edema   . Right hip pain 02/13/2013  . Shingles   . Tubal pregnancy     Past Surgical History:  Procedure Laterality Date  . APPENDECTOMY    . CATARACT EXTRACTION    . EYE SURGERY    . LAMINOTOMY     Dr Jordan Likes L3-4  and microdiskectomy  . TONSILLECTOMY    . TUBAL LIGATION      Social History   Social History  . Marital  status: Married    Spouse name: N/A  . Number of children: 3  . Years of education: N/A   Occupational History  . Retired Retired   Social History Main Topics  . Smoking status: Never Smoker  . Smokeless tobacco: Never Used  . Alcohol use No  . Drug use: No  . Sexual activity: No   Other Topics Concern  . Not on file   Social History Narrative  . No narrative on file    Allergies  Allergen Reactions  . Sulfa Antibiotics Hives  . Cyclobenzaprine Hcl Rash    Family History  Problem Relation Age of Onset  . Stroke Father   . Coronary artery disease Unknown   . Heart disease Mother     Prior to Admission medications   Medication Sig Start Date End Date Taking? Authorizing Provider  amLODipine-valsartan (EXFORGE) 5-320 MG tablet TAKE 1 TABLET BY MOUTH DAILY 06/01/17   Sharlene Dory, DO  ASPIRIN LOW DOSE 81 MG EC tablet TAKE 1 TABLET BY MOUTH DAILY 03/09/17   Sharlene Dory, DO  atorvastatin (LIPITOR) 40 MG tablet TAKE 1 TABLET(40 MG) BY MOUTH DAILY 03/09/17   Sharlene Dory, DO  clopidogrel (PLAVIX) 75 MG tablet TAKE 1 TABLET(75 MG) BY MOUTH EVERY MORNING  12/12/16   Sharlene Dory, DO  furosemide (LASIX) 20 MG tablet Take 1 tablet (20 mg total) by mouth daily. Take every other day 05/27/17   Sharlene Dory, DO  gabapentin (NEURONTIN) 300 MG capsule Take 1 capsule (300 mg total) by mouth 3 (three) times daily. 08/06/16   Sharlene Dory, DO  glucose blood test strip Use as instructed to check blood sugar 3 times per day Diagnoses Code 250.00 12/04/16   Sharlene Dory, DO  INVOKANA 100 MG TABS tablet TAKE 1 TABLET BY MOUTH EVERY MORNING WITH BREAKFAST 12/22/16   [name], Jilda Roche, DO  Lancets (FREESTYLE) lancets Use as instructed to check blood sugar 3 times per day Diagnoses Code 250.00 07/09/16   Mesner, Barbara Cower, MD  metFORMIN (GLUCOPHAGE-XR) 750 MG 24 hr tablet TAKE 1 TABLET BY MOUTH EVERY MORNING WITH BREAKFAST  12/22/16   [name], Jilda Roche, DO  methocarbamol (ROBAXIN) 500 MG tablet Take 1 tablet (500 mg total) by mouth 2 (two) times daily as needed for muscle spasms. 07/09/16   Mesner, Barbara Cower, MD  metoprolol tartrate (LOPRESSOR) 50 MG tablet TAKE 1/2 TABLET(25 MG) BY MOUTH TWICE DAILY 03/16/17   Sharlene Dory, DO  nitroGLYCERIN (NITROSTAT) 0.4 MG SL tablet Place 1 tablet (0.4 mg total) under the tongue every 5 (five) minutes as needed for chest pain. 07/09/16   Mesner, Barbara Cower, MD  PATADAY 0.2 % SOLN Place 1 drop into both eyes daily. 08/25/14   [provider]  potassium chloride (K-DUR,KLOR-CON) 10 MEQ tablet Take 1 tablet (10 mEq total) by mouth 2 (two) times daily. Take with lasix 05/29/17   Sharlene Dory, DO  tacrolimus (PROTOPIC) 0.03 % ointment Apply topically 2 (two) times daily. Apply to eyelids 1-2 times daily as needed.    [provider]  triamcinolone cream (KENALOG) 0.1 % Apply to affect area as needed. 08/20/16   [provider]    Physical Exam: Vitals:   06/01/17 1630 06/01/17 1700 06/01/17 1730 06/01/17 2029  BP: (!) 137/53 (!) 134/46 (!) 140/55 (!) 137/48  Pulse: 83 86 92 95  Resp: 18 (!) Temp:    98.4 F (36.9 C)  TempSrc:    Oral  SpO2: 99% 99% 100% 99%  Height:     (1.549 m)     General:  Appears calm and comfortable and is NAD Eyes:   EOMI, normal lids, iris ENT:  grossly normal hearing, lips & tongue, mmm Neck:  no LAD, masses or thyromegaly; no carotid bruits Cardiovascular:  RRR, no m/r/g. 2+ LE edema.  Respiratory:   CTA bilaterally with no wheezes/rales/rhonchi.  Increased respiratory effort. Abdomen:  soft, NT, ND, NABS Back:   normal alignment, no CVAT Skin:  no rash or induration seen on limited exam Musculoskeletal:  grossly normal tone BUE/BLE, good ROM, no bony abnormality Psychiatric:  grossly normal mood and affect, speech fluent and appropriate, AOx3 Neurologic:  CN 2-12 grossly intact, moves  all extremities in coordinated fashion, sensation intact    Radiological Exams on Admission: Dg Chest 2 View  Result Date: 06/01/2017 CLINICAL DATA:  Ionic shortness of breath with increased symptoms over the past week. History of diabetes, coronary artery disease with previous MI and pulmonary edema and pleural effusions. Never smoked. EXAM: CHEST  2 VIEW COMPARISON:  Chest x-ray of May 27, 2017 FINDINGS: The lungs are reasonably well inflated. Bilateral pleural effusions persist greatest on the left. The heart is top-normal in size. The central  pulmonary vascularity is mildly prominent. There is calcification in the wall of the aortic arch. The observed bony thorax is unremarkable. IMPRESSION: Persistent small or moderate bilateral pleural effusions. Mild pulmonary vascular congestion and interstitial prominence consistent with CHF. There has not been dramatic interval change since the previous study. Thoracic aortic atherosclerosis. Electronically Signed   By: David  [place] M.D.   On: 06/01/2017 12:16    EKG: Independently reviewed.  NSR with rate 85; nonspecific ST changes with no evidence of acute ischemia   Labs on Admission: I have personally reviewed the available labs and imaging studies at the time of the admission.  Pertinent labs:   Glucose 158, 122 BUN 24/Creatinine 1.09/GFR 47; 22/1.18/47 on 10/11; stable from 7/18 BNP 277.2 Troponin <0.03 Hgb 9.6; 11.2 in 7/18  Assessment/Plan Principal Problem:   Acute on chronic diastolic CHF (congestive heart failure) (HCC) Active Problems:   Diabetes mellitus type 2, controlled (HCC)   Essential hypertension   Anemia   Bladder spasm   CHF -Patient without smoking history or prior h/o respiratory failure presenting with worsening SOB and hypoxia -CXR consistent with mild pulmonary edema -Normal WBC count, no fever; will not give antibiotics at this time -Mildly elevated BNP - no prior available for comparison -With elevated  BNP and abnl CXR, CHF seems probable as diagnosis -Recent Echo (7/18) showed grade 1 diastolic dysfunction with preserved EF -Will place in observation status with telemetry -Will continue ASA -Will continue ARB (on valsartan but formulary substituted) -Continue Lopressor -Hold amlodipine and consider discontinuation due to its potential for contributing to edema -CHF order set utilized -Was given Lasix 60 mg x 1 in ER and will continue with 40 mg IV BID -Continue [place] O2 for now -Stable kidney function at this time, will follow -Repeat EKG in AM  DM -Hold Invokana and Metformin -Cover with moderate-scale SSI -A1c in 7/18 was 6.9  HTN -As above, hold amlodipine and continue Lopressor and ARB  Anemia -Normocytic but almost macrocytic anemia which is more significant than prior -Will check folate and B12 levels  Bladder spasm -Developed when she was given lasix -Will attempt to treat with oxybutynin -If unsuccessful, patient may require a foley catheter   DVT prophylaxis:  Lovenox  Code Status:  Full - confirmed with patient Family Communication: None present Disposition Plan:  Home once clinically improved Consults called: CM/PT/OT Admission status: It is my clinical opinion that referral for OBSERVATION is reasonable and necessary in this patient based on the above information provided. The aforementioned taken together are felt to place the patient at high risk for further clinical deterioration. However it is anticipated that the patient may be medically stable for discharge from the hospital within 24 to 48 hours.    Jonah Blue MD [facility]  If note is complete, please contact covering daytime or nighttime physician. [email or web address] Password [email or web address]  06/01/2017, 10:19 PM

## 2017-06-01 NOTE — ED Triage Notes (Signed)
C/o chronic SOB-worse x 1 week-states she was seen here for same last week-presents to triage in w/c-RT in for assessment

## 2017-06-01 NOTE — Progress Notes (Signed)
Chief Complaint  Patient presents with  . Shortness of Breath  . Nausea  . Fatigue    Subjective: Patient is a 82 y.o. female here for f/u SOB. She is here with her grandmother.  Pulm edema and small effusions noted on CXR 5 days ago, Lasix was increased to 20 mg daily from every other day. She feels no better since then. She is mainly short of breath when she physically exerts herself. She will urinate within 10-15 minutes of taking the medicine and it lasts for the full day. She's not been sleeping well and will not lie down because she gets short of breath. She will cough, usually productive, a frothy type substance comes up. She denies any chest pain. While there have been no weight changes since her last visit, she has gained 8 pounds from baseline.   ROS: Heart: Denies chest pain  Lungs: +SOB   Family History  Problem Relation Age of Onset  . Stroke Father   . Coronary artery disease Unknown   . Heart disease Mother    Past Medical History:  Diagnosis Date  . Allergy   . CAD (coronary artery disease)   . Diabetes mellitus    type II  . Diabetic retinopathy    Dr Perley Jain @ [facility] (vitrectomy left eye)  . Gastroenteritis 02/13/2013  . History of GI bleed    secondary to heparin  . Hypertension   . LBP (low back pain)   . Muscle cramp 02/13/2013  . Myocardial infarction (HCC)   . Pleural effusion   . Pulmonary edema   . Right hip pain 02/13/2013  . Shingles   . Tubal pregnancy    Allergies  Allergen Reactions  . Sulfa Antibiotics Hives  . Cyclobenzaprine Hcl Rash    Current Outpatient Prescriptions:  .  amLODipine-valsartan (EXFORGE) 5-320 MG tablet, TAKE 1 TABLET BY MOUTH DAILY, Disp: 90 tablet, Rfl: 0 .  ASPIRIN LOW DOSE 81 MG EC tablet, TAKE 1 TABLET BY MOUTH DAILY, Disp: 90 tablet, Rfl: 1 .  atorvastatin (LIPITOR) 40 MG tablet, TAKE 1 TABLET(40 MG) BY MOUTH DAILY, Disp: 90 tablet, Rfl: 1 .  clopidogrel (PLAVIX) 75 MG tablet, TAKE 1 TABLET(75 MG) BY MOUTH  EVERY MORNING, Disp: 90 tablet, Rfl: 1 .  furosemide (LASIX) 20 MG tablet, Take 1 tablet (20 mg total) by mouth daily. Take every other day, Disp: 30 tablet, Rfl:  .  gabapentin (NEURONTIN) 300 MG capsule, Take 1 capsule (300 mg total) by mouth 3 (three) times daily., Disp: 90 capsule, Rfl: 0 .  glucose blood test strip, Use as instructed to check blood sugar 3 times per day Diagnoses Code 250.00, Disp: 100 each, Rfl: 12 .  INVOKANA 100 MG TABS tablet, TAKE 1 TABLET BY MOUTH EVERY MORNING WITH BREAKFAST, Disp: 30 tablet, Rfl: 5 .  Lancets (FREESTYLE) lancets, Use as instructed to check blood sugar 3 times per day Diagnoses Code 250.00, Disp: 300 each, Rfl: prn .  metFORMIN (GLUCOPHAGE-XR) 750 MG 24 hr tablet, TAKE 1 TABLET BY MOUTH EVERY MORNING WITH BREAKFAST, Disp: 30 tablet, Rfl: 5 .  methocarbamol (ROBAXIN) 500 MG tablet, Take 1 tablet (500 mg total) by mouth 2 (two) times daily as needed for muscle spasms., Disp: 20 tablet, Rfl: 0 .  metoprolol tartrate (LOPRESSOR) 50 MG tablet, TAKE 1/2 TABLET(25 MG) BY MOUTH TWICE DAILY, Disp: 90 tablet, Rfl: 1 .  nitroGLYCERIN (NITROSTAT) 0.4 MG SL tablet, Place 1 tablet (0.4 mg total) under the tongue every 5 (five)  minutes as needed for chest pain., Disp: 15 tablet, Rfl: 0 .  PATADAY 0.2 % SOLN, Place 1 drop into both eyes daily., Disp: , Rfl:  .  potassium chloride (K-DUR,KLOR-CON) 10 MEQ tablet, Take 1 tablet (10 mEq total) by mouth 2 (two) times daily. Take with lasix, Disp: 60 tablet, Rfl: 1 .  tacrolimus (PROTOPIC) 0.03 % ointment, Apply topically 2 (two) times daily. Apply to eyelids 1-2 times daily as needed., Disp: , Rfl:  .  triamcinolone cream (KENALOG) 0.1 %, Apply to affect area as needed., Disp: , Rfl:     Objective: BP 118/82 (BP Location: Left Arm, Patient Position: Sitting, Cuff Size: Large)   Pulse 82   Temp 98.4 F (36.9 C) (Oral)   Ht  (1.549 m)   Wt 228 lb (103.4 kg)   SpO2 92%   BMI 43.08 kg/m  General: Awake, appears  stated age; she is in no acute distress when she is sedentary HEENT: MMM, EOMi Heart: RRR, +SEM, +LE edema Lungs: Bibasilar rales appreciated. No accessory muscle use MSK: No calf pain, unsteady gait Psych: Age appropriate judgment and insight, normal affect and mood  Assessment and Plan: Dyspnea on exertion  SOB (shortness of breath) - Plan: EKG 12-Lead  EKG showed no changes from previous in 2017, she is not having chest pain or pressure.  I rec'd she go to ER for further evaluation. She has had an adequate response to diuretics and given her rapid dyspnea with minimal physical exertion, would rather she be evaluated in ED prior to sending her home vs inpatient admission. I spoke with Dr. Patria Mane regarding the patient.  Stop Actos in meantime. F/u with me pending ED evaluation.  The patient and granddaughter voiced understanding and agreement to the plan.  Jilda Roche [place], DO 06/01/17  11:58 AM

## 2017-06-01 NOTE — ED Provider Notes (Signed)
[facility] EMERGENCY DEPARTMENT Provider Note   CSN: [id number] Arrival date & time: 06/01/17  1149     History   Chief Complaint Chief Complaint  Patient presents with  . Shortness of Breath    HPI Elizabeth [name] is a 82 y.o. female.  HPI Pt presents with ongoing moderate exertional SOB with orthopnea. Normally on every other day lasix, this was increased last week by pcp for increasing SOB to daily. Returned to clinic today with ongoing exertional SOB and no significant improvement. No cough or leg swelling. No hx of DVT or PE. No fevers. Tachypnea noted at bedside. In clinic today was found to be 88% with ambulation and increased work of breathing. Sent to ER for evaluation from clinic upstairs   Past Medical History:  Diagnosis Date  . Allergy   . CAD (coronary artery disease)   . Diabetes mellitus    type II  . Diabetic retinopathy    Dr Perley Jain @ [facility] (vitrectomy left eye)  . Gastroenteritis 02/13/2013  . History of GI bleed    secondary to heparin  . Hypertension   . LBP (low back pain)   . Muscle cramp 02/13/2013  . Myocardial infarction (HCC)   . Pleural effusion   . Pulmonary edema   . Right hip pain 02/13/2013  . Shingles   . Tubal pregnancy     Patient Active Problem List   Diagnosis Date Noted  . Acute diastolic (congestive) heart failure (HCC) 06/01/2017  . URI (upper respiratory infection) 11/29/2014  . Diabetic neuropathy (HCC) 11/17/2014  . Right hip pain 02/13/2013  . Gastroenteritis 02/13/2013  . Muscle cramp 02/13/2013  . Chronic cough 06/22/2012  . Postmenopausal bone loss 02/11/2012  . Anemia 02/11/2012  . BACK PAIN, LUMBAR, CHRONIC 06/19/2010  . BACK PAIN, THORACIC REGION 04/15/2010  . Coronary atherosclerosis 09/24/2009  . Diabetes mellitus type 2, controlled (HCC) 09/20/2009  . Essential hypertension 09/20/2009  . MYOCARDIAL INFARCTION, HX OF 09/20/2009  . ALLERGIC RHINITIS 09/20/2009    Past Surgical History:    Procedure Laterality Date  . APPENDECTOMY    . CATARACT EXTRACTION    . EYE SURGERY    . LAMINOTOMY     Dr Jordan Likes L3-4  and microdiskectomy  . TONSILLECTOMY    . TUBAL LIGATION      OB History    No data available       Home Medications    Prior to Admission medications   Medication Sig Start Date End Date Taking? Authorizing Provider  amLODipine-valsartan (EXFORGE) 5-320 MG tablet TAKE 1 TABLET BY MOUTH DAILY 06/01/17   Sharlene Dory, DO  ASPIRIN LOW DOSE 81 MG EC tablet TAKE 1 TABLET BY MOUTH DAILY 03/09/17   Sharlene Dory, DO  atorvastatin (LIPITOR) 40 MG tablet TAKE 1 TABLET(40 MG) BY MOUTH DAILY 03/09/17   Sharlene Dory, DO  clopidogrel (PLAVIX) 75 MG tablet TAKE 1 TABLET(75 MG) BY MOUTH EVERY MORNING 12/12/16   [name], Jilda Roche, DO  furosemide (LASIX) 20 MG tablet Take 1 tablet (20 mg total) by mouth daily. Take every other day 05/27/17   Sharlene Dory, DO  gabapentin (NEURONTIN) 300 MG capsule Take 1 capsule (300 mg total) by mouth 3 (three) times daily. 08/06/16   Sharlene Dory, DO  glucose blood test strip Use as instructed to check blood sugar 3 times per day Diagnoses Code 250.00 12/04/16   [name], Jilda Roche, DO  INVOKANA 100 MG TABS tablet TAKE  1 TABLET BY MOUTH EVERY MORNING WITH BREAKFAST 12/22/16   [name], Jilda Roche, DO  Lancets (FREESTYLE) lancets Use as instructed to check blood sugar 3 times per day Diagnoses Code 250.00 07/09/16   Mesner, Barbara Cower, MD  metFORMIN (GLUCOPHAGE-XR) 750 MG 24 hr tablet TAKE 1 TABLET BY MOUTH EVERY MORNING WITH BREAKFAST 12/22/16   [name], Jilda Roche, DO  methocarbamol (ROBAXIN) 500 MG tablet Take 1 tablet (500 mg total) by mouth 2 (two) times daily as needed for muscle spasms. 07/09/16   Mesner, Barbara Cower, MD  metoprolol tartrate (LOPRESSOR) 50 MG tablet TAKE 1/2 TABLET(25 MG) BY MOUTH TWICE DAILY 03/16/17   Sharlene Dory, DO  nitroGLYCERIN (NITROSTAT) 0.4 MG SL tablet  Place 1 tablet (0.4 mg total) under the tongue every 5 (five) minutes as needed for chest pain. 07/09/16   Mesner, Barbara Cower, MD  PATADAY 0.2 % SOLN Place 1 drop into both eyes daily. 08/25/14   [provider]  potassium chloride (K-DUR,KLOR-CON) 10 MEQ tablet Take 1 tablet (10 mEq total) by mouth 2 (two) times daily. Take with lasix 05/29/17   Sharlene Dory, DO  tacrolimus (PROTOPIC) 0.03 % ointment Apply topically 2 (two) times daily. Apply to eyelids 1-2 times daily as needed.    [provider]  triamcinolone cream (KENALOG) 0.1 % Apply to affect area as needed. 08/20/16   [provider]    Family History Family History  Problem Relation Age of Onset  . Stroke Father   . Coronary artery disease Unknown   . Heart disease Mother     Social History Social History  Substance Use Topics  . Smoking status: Never Smoker  . Smokeless tobacco: Never Used  . Alcohol use No     Allergies   Sulfa antibiotics and Cyclobenzaprine hcl   Review of Systems Review of Systems  All other systems reviewed and are negative.    Physical Exam Updated Vital Signs BP (!) 128/58   Pulse 87   Temp 98.2 F (36.8 C) (Oral)   Resp (!) 21   SpO2 99%   Physical Exam  Constitutional: She is oriented to person, place, and time. She appears well-developed and well-nourished. No distress.  HENT:  Head: Normocephalic and atraumatic.  Eyes: EOM are normal.  Neck: Normal range of motion.  Cardiovascular: Normal rate, regular rhythm and normal heart sounds.   Pulmonary/Chest: She has rales.  Tachypnea. No accessory muscle use  Abdominal: Soft. She exhibits no distension. There is no tenderness.  Musculoskeletal: Normal range of motion.  Neurological: She is alert and oriented to person, place, and time.  Skin: Skin is warm and dry.  Psychiatric: She has a normal mood and affect. Judgment normal.  Nursing note and vitals reviewed.    ED Treatments / Results    Labs (all labs ordered are listed, but only abnormal results are displayed) Labs Reviewed  CBC - Abnormal; Notable for the following:       Result Value   RBC 3.18 (*)    Hemoglobin 9.6 (*)    HCT 31.6 (*)    RDW 16.2 (*)    All other components within normal limits  COMPREHENSIVE METABOLIC PANEL - Abnormal; Notable for the following:    Glucose, Bld 158 (*)    BUN 24 (*)    Creatinine, Ser 1.09 (*)    Calcium 8.7 (*)    Albumin 3.3 (*)    GFR calc non Af Amer 47 (*)    GFR calc Af  Amer 54 (*)    All other components within normal limits  BRAIN NATRIURETIC PEPTIDE - Abnormal; Notable for the following:    B Natriuretic Peptide 277.2 (*)    All other components within normal limits  TROPONIN I    EKG  EKG Interpretation  Date/Time:  Monday June 01 2017 12:19:29 EDT Ventricular Rate:  85 PR Interval:    QRS Duration: 95 QT Interval:  383 QTC Calculation: 456 R Axis:   -10 Text Interpretation:  Sinus rhythm Low voltage, extremity and precordial leads Abnormal T, consider ischemia, lateral leads No significant change was found Confirmed by Azalia Bilis ([id number]) on 06/01/2017 4:21:44 PM       Radiology Dg Chest 2 View  Result Date: 06/01/2017 CLINICAL DATA:  Ionic shortness of breath with increased symptoms over the past week. History of diabetes, coronary artery disease with previous MI and pulmonary edema and pleural effusions. Never smoked. EXAM: CHEST  2 VIEW COMPARISON:  Chest x-ray of May 27, 2017 FINDINGS: The lungs are reasonably well inflated. Bilateral pleural effusions persist greatest on the left. The heart is top-normal in size. The central pulmonary vascularity is mildly prominent. There is calcification in the wall of the aortic arch. The observed bony thorax is unremarkable. IMPRESSION: Persistent small or moderate bilateral pleural effusions. Mild pulmonary vascular congestion and interstitial prominence consistent with CHF. There has not been dramatic  interval change since the previous study. Thoracic aortic atherosclerosis. Electronically Signed   By: David  [place] M.D.   On: 06/01/2017 12:16    Procedures Procedures (including critical care time)  Medications Ordered in ED Medications  furosemide (LASIX) injection 60 mg (60 mg Intravenous Given 06/01/17 1342)     Initial Impression / Assessment and Plan / ED Course  I have reviewed the triage vital signs and the nursing notes.  Pertinent labs & imaging results that were available during my care of the patient were reviewed by me and considered in my medical decision making (see chart for details).     The patient was admitted for ongoing diuresis secondary to failed outpatient treatment. Diastolic heart failure. Admit to [facility] No active CP  Final Clinical Impressions(s) / ED Diagnoses   Final diagnoses:  None    New Prescriptions New Prescriptions   No medications on file     Azalia Bilis, MD 06/01/17 1625

## 2017-06-01 NOTE — ED Notes (Signed)
Returned from [employer], +DOE, SpO2 86% on r/a, placed on 2l/m [place] SpO2 now 98%

## 2017-06-01 NOTE — ED Notes (Signed)
Purewick external female catheter applied.

## 2017-06-01 NOTE — ED Notes (Signed)
Report to Grace, RN at [facility] 

## 2017-06-02 DIAGNOSIS — Z881 Allergy status to other antibiotic agents status: Secondary | ICD-10-CM | POA: Diagnosis not present

## 2017-06-02 DIAGNOSIS — I130 Hypertensive heart and chronic kidney disease with heart failure and stage 1 through stage 4 chronic kidney disease, or unspecified chronic kidney disease: Secondary | ICD-10-CM | POA: Diagnosis present

## 2017-06-02 DIAGNOSIS — Z8249 Family history of ischemic heart disease and other diseases of the circulatory system: Secondary | ICD-10-CM | POA: Diagnosis not present

## 2017-06-02 DIAGNOSIS — E1140 Type 2 diabetes mellitus with diabetic neuropathy, unspecified: Secondary | ICD-10-CM | POA: Diagnosis present

## 2017-06-02 DIAGNOSIS — Z7982 Long term (current) use of aspirin: Secondary | ICD-10-CM | POA: Diagnosis not present

## 2017-06-02 DIAGNOSIS — Z7984 Long term (current) use of oral hypoglycemic drugs: Secondary | ICD-10-CM | POA: Diagnosis not present

## 2017-06-02 DIAGNOSIS — J309 Allergic rhinitis, unspecified: Secondary | ICD-10-CM | POA: Diagnosis present

## 2017-06-02 DIAGNOSIS — Z981 Arthrodesis status: Secondary | ICD-10-CM | POA: Diagnosis not present

## 2017-06-02 DIAGNOSIS — I10 Essential (primary) hypertension: Secondary | ICD-10-CM

## 2017-06-02 DIAGNOSIS — I252 Old myocardial infarction: Secondary | ICD-10-CM | POA: Diagnosis not present

## 2017-06-02 DIAGNOSIS — E11319 Type 2 diabetes mellitus with unspecified diabetic retinopathy without macular edema: Secondary | ICD-10-CM | POA: Diagnosis present

## 2017-06-02 DIAGNOSIS — I5031 Acute diastolic (congestive) heart failure: Secondary | ICD-10-CM | POA: Diagnosis present

## 2017-06-02 DIAGNOSIS — Z7902 Long term (current) use of antithrombotics/antiplatelets: Secondary | ICD-10-CM | POA: Diagnosis not present

## 2017-06-02 DIAGNOSIS — N3289 Other specified disorders of bladder: Secondary | ICD-10-CM | POA: Diagnosis present

## 2017-06-02 DIAGNOSIS — Z79899 Other long term (current) drug therapy: Secondary | ICD-10-CM | POA: Diagnosis not present

## 2017-06-02 DIAGNOSIS — D539 Nutritional anemia, unspecified: Secondary | ICD-10-CM | POA: Diagnosis present

## 2017-06-02 DIAGNOSIS — I5033 Acute on chronic diastolic (congestive) heart failure: Secondary | ICD-10-CM | POA: Diagnosis present

## 2017-06-02 DIAGNOSIS — N183 Chronic kidney disease, stage 3 (moderate): Secondary | ICD-10-CM | POA: Diagnosis present

## 2017-06-02 DIAGNOSIS — I2510 Atherosclerotic heart disease of native coronary artery without angina pectoris: Secondary | ICD-10-CM | POA: Diagnosis present

## 2017-06-02 DIAGNOSIS — E1122 Type 2 diabetes mellitus with diabetic chronic kidney disease: Secondary | ICD-10-CM | POA: Diagnosis present

## 2017-06-02 DIAGNOSIS — R0902 Hypoxemia: Secondary | ICD-10-CM | POA: Diagnosis present

## 2017-06-02 DIAGNOSIS — E876 Hypokalemia: Secondary | ICD-10-CM | POA: Diagnosis present

## 2017-06-02 LAB — CBC WITH DIFFERENTIAL/PLATELET
BASOS PCT: 0 %
Basophils Absolute: 0 10*3/uL (ref 0.0–0.1)
EOS ABS: 0 10*3/uL (ref 0.0–0.7)
Eosinophils Relative: 0 %
HCT: 29.8 % — ABNORMAL LOW (ref 36.0–46.0)
HEMOGLOBIN: 9 g/dL — AB (ref 12.0–15.0)
LYMPHS ABS: 0.6 10*3/uL — AB (ref 0.7–4.0)
Lymphocytes Relative: 10 %
MCH: 29.4 pg (ref 26.0–34.0)
MCHC: 30.2 g/dL (ref 30.0–36.0)
MCV: 97.4 fL (ref 78.0–100.0)
Monocytes Absolute: 0.5 10*3/uL (ref 0.1–1.0)
Monocytes Relative: 7 %
NEUTROS ABS: 5.3 10*3/uL (ref 1.7–7.7)
NEUTROS PCT: 83 %
Platelets: 265 10*3/uL (ref 150–400)
RBC: 3.06 MIL/uL — AB (ref 3.87–5.11)
RDW: 16.2 % — ABNORMAL HIGH (ref 11.5–15.5)
WBC: 6.4 10*3/uL (ref 4.0–10.5)

## 2017-06-02 LAB — GLUCOSE, CAPILLARY
GLUCOSE-CAPILLARY: 123 mg/dL — AB (ref 65–99)
GLUCOSE-CAPILLARY: 147 mg/dL — AB (ref 65–99)
Glucose-Capillary: 100 mg/dL — ABNORMAL HIGH (ref 65–99)
Glucose-Capillary: 131 mg/dL — ABNORMAL HIGH (ref 65–99)

## 2017-06-02 LAB — BASIC METABOLIC PANEL
Anion gap: 6 (ref 5–15)
BUN: 21 mg/dL — AB (ref 6–20)
CHLORIDE: 106 mmol/L (ref 101–111)
CO2: 29 mmol/L (ref 22–32)
CREATININE: 1.28 mg/dL — AB (ref 0.44–1.00)
Calcium: 8.4 mg/dL — ABNORMAL LOW (ref 8.9–10.3)
GFR calc non Af Amer: 38 mL/min — ABNORMAL LOW (ref >60)
GFR, EST AFRICAN AMERICAN: 45 mL/min — AB (ref >60)
Glucose, Bld: 145 mg/dL — ABNORMAL HIGH (ref 65–99)
POTASSIUM: 3.4 mmol/L — AB (ref 3.5–5.1)
SODIUM: 141 mmol/L (ref 135–145)

## 2017-06-02 LAB — VITAMIN B12: VITAMIN B 12: 400 pg/mL (ref 180–914)

## 2017-06-02 MED ORDER — POTASSIUM CHLORIDE CRYS ER 20 MEQ PO TBCR
60.0000 meq | EXTENDED_RELEASE_TABLET | Freq: Once | ORAL | Status: AC
  Administered 2017-06-02: 60 meq via ORAL
  Filled 2017-06-02: qty 3

## 2017-06-02 NOTE — Evaluation (Signed)
Occupational Therapy Evaluation Patient Details Name: Elizabeth [name] MRN: [id number] DOB: Dec 07, 1934 Today's Date: 06/02/2017    History of Present Illness 82 y.o. female with medical history significant of CAD; diastolic heart failure (grade 1 on 7/18 echo); HTN; DM; and visual disturbance presenting with SOB.    Clinical Impression   PTA, pt was living with her husband and was performing ADLs. Currently pt requires Min Guard for LB ADLs for safety and Min Guard-Min A for functional mobility. Pt presenting with decreased activity tolerance. Provided education and handout on EC and AE to optimize independence at home. Pt would benefit from further acute OT to facilitate safe dc. Recommend dc home once medically stable per physician.     Follow Up Recommendations  No OT follow up;Supervision/Assistance - 24 hour    Equipment Recommendations  None recommended by OT    Recommendations for Other Services PT consult     Precautions / Restrictions Precautions Precautions: Fall Restrictions Weight Bearing Restrictions: No      Mobility Bed Mobility               General bed mobility comments: Pt in recliner upon arrival  Transfers Overall transfer level: Needs assistance Equipment used: 1 person hand held assist Transfers: Sit to/from Stand Sit to Stand: Min assist;Min guard         General transfer comment: Min A to steady in standing for inittial sit<>stand. Min guard for following transfers    Balance Overall balance assessment: Needs assistance Sitting-balance support: No upper extremity supported;Feet supported Sitting balance-Leahy Scale: Good Sitting balance - Comments: Able to lean forward to adjust socks    Standing balance support: No upper extremity supported;During functional activity Standing balance-Leahy Scale: Fair Standing balance comment: Able to maintain static standing without UE support                           ADL either  performed or assessed with clinical judgement   ADL Overall ADL's : Needs assistance/impaired Eating/Feeding: Set up;Sitting   Grooming: Wash/dry hands;Min guard;Standing   Upper Body Bathing: Supervision/ safety;Set up;Sitting   Lower Body Bathing: Min guard;Sit to/from stand   Upper Body Dressing : Set up;Supervision/safety;Sitting   Lower Body Dressing: Min guard;Sit to/from stand Lower Body Dressing Details (indicate cue type and reason): Min guard for safety. Pt able to lean forward and adjust socks.  Toilet Transfer: Min guard;Ambulation;Cueing for safety;BSC Toilet Transfer Details (indicate cue type and reason): Pt requiring cues for safety due to urgency to reach [facility] for urination. Min Guard for Astronomer and Hygiene: Set up;Supervision/safety;Sitting/lateral lean       Functional mobility during ADLs: Min guard;Minimal assistance General ADL Comments: Pt performing LB ADLs with Min Guard for safety. Pt performing fucnitonal mobility with [place]-Min A to steady; pt reaching out for surfaces possibly due to decreased vision. Pt SpO2 97 on 1L. On roomair, pt SpO2 dropped to 88 after LB ADLs. Returned pt to 1L O2 during mobility. At end of session, pt SPO2 93 on 1L Provided education on EC and AE. Pt verbalizing understanding. Issued EC handout     Vision Baseline Vision/History: Legally blind Patient Visual Report: No change from baseline;Other (comment) (Pt able to read with proper lighting, glasses, and magnifer)       Perception     Praxis      Pertinent Vitals/Pain Pain Assessment: Faces Faces Pain Scale: No hurt Pain Intervention(s): Monitored during  session     Hand Dominance Right   Extremity/Trunk Assessment Upper Extremity Assessment Upper Extremity Assessment: Overall WFL for tasks assessed   Lower Extremity Assessment Lower Extremity Assessment: Defer to PT evaluation   Cervical / Trunk Assessment Cervical / Trunk  Assessment: Normal;Other exceptions Cervical / Trunk Exceptions: Increased body habitus   Communication Communication Communication: No difficulties   Cognition Arousal/Alertness: Awake/alert Behavior During Therapy: WFL for tasks assessed/performed Overall Cognitive Status: Within Functional Limits for tasks assessed                                     General Comments  Husband and granddaughter present throughout session. Provided EC handout and education.     Exercises     Shoulder Instructions      Home Living Family/patient expects to be discharged to:: Private residence Living Arrangements: Spouse/significant other Available Help at Discharge: Family;Available 24 hours/day Type of Home: House Home Access: Stairs to enter [employer] of Steps: 3 Entrance Stairs-Rails: Right Home Layout: One level     Bathroom Shower/Tub: Tub/shower unit;Curtain   Bathroom Toilet: Standard     Home Equipment: Tub bench;Grab bars - tub/shower;Grab bars - toilet;Walker - 2 wheels;Cane - single point;Bedside commode;Wheelchair - manual (rollator)          Prior Functioning/Environment Level of Independence: Needs assistance  Gait / Transfers Assistance Needed: Pt has RW and cane but does not use. Husband assists to guide her due to decreased vision ADL's / Homemaking Assistance Needed: Performs ADLs and family assists with IADLs   Comments: Family is very supportive and provide support         OT Problem List: Decreased activity tolerance;Impaired balance (sitting and/or standing);Impaired vision/perception;Decreased knowledge of use of DME or AE;Decreased knowledge of precautions      OT Treatment/Interventions: Self-care/ADL training;Therapeutic exercise;Energy conservation;DME and/or AE instruction;Therapeutic activities;Patient/family education    OT Goals(Current goals can be found in the care plan section) Acute Rehab OT Goals Patient Stated  Goal: Go home OT Goal Formulation: With patient Time For Goal Achievement: 06/16/17 Potential to Achieve Goals: Good ADL Goals Pt Will Perform Grooming: with modified independence;standing Pt Will Perform Upper Body Dressing: with modified independence;sitting Pt Will Perform Lower Body Dressing: sit to/from stand Additional ADL Goal #1: Pt will verbalize three energy conservation techniques with 1-2 VCs  OT Frequency: Min 2X/week   Barriers to D/C:            Co-evaluation              AM-PAC PT "6 Clicks" Daily Activity     Outcome Measure Help from another person eating meals?: None Help from another person taking care of personal grooming?: A Little Help from another person toileting, which includes using toliet, bedpan, or urinal?: A Little Help from another person bathing (including washing, rinsing, drying)?: A Little Help from another person to put on and taking off regular upper body clothing?: A Little Help from another person to put on and taking off regular lower body clothing?: A Little 6 Click Score: 19   End of Session Equipment Utilized During Treatment: Gait belt;Oxygen (1L) Nurse Communication: Mobility status;Other (comment) (Needing external cath; urinated during session)  Activity Tolerance: Patient tolerated treatment well Patient left: in chair;with call bell/phone within reach;with family/visitor present  OT Visit Diagnosis: Unsteadiness on feet (R26.81);Other abnormalities of gait and mobility (R26.89);Muscle weakness (generalized) (M62.81)  Time: 9562-1308 OT Time Calculation (min): 26 min Charges:  OT General Charges $OT Visit: 1 Visit OT Evaluation $OT Eval Moderate Complexity: 1 Mod OT Treatments $Self Care/Home Management : 8-22 mins G-Codes: OT G-codes **NOT FOR INPATIENT CLASS** Functional Assessment Tool Used: Clinical judgement Functional Limitation: Self care Self Care Current Status (M5784): At least 1 percent but  less than 20 percent impaired, limited or restricted Self Care Goal Status (O9629): 0 percent impaired, limited or restricted   Keydi Giel MSOT, OTR/L Acute Rehab Pager: [phone] Office: [phone]  Theodoro Grist Cortavius Montesinos 06/02/2017, 1:13 PM

## 2017-06-02 NOTE — Progress Notes (Signed)
Text paged on call about blood pressure of 91/51.  Dr. Ophelia Charter gave order to hold scheduled metoprolol and lasix.  Patient stable and asymptomatic will continue to monitor.

## 2017-06-02 NOTE — Care Management Obs Status (Signed)
MEDICARE OBSERVATION STATUS NOTIFICATION   Patient Details  Name: Elizabeth [name] MRN: [id number] Date of Birth: 1935/02/28   Medicare Observation Status Notification Given:  Yes    Lawerance Sabal, RN 06/02/2017, 9:21 AM

## 2017-06-02 NOTE — Progress Notes (Signed)
Patient ID: Elizabeth [name], female   DOB: 1935-05-03, 82 y.o.   MRN: [id number]  PROGRESS NOTE    Sonali Wivell Hancock  QMV:[id number] DOB: [birth year] DOA: 06/01/2017 PCP: Sharlene Dory, DO   Brief Narrative:  82 year female with history of CAD; diastolic heart failure with EF of 60-65% and grade 1 diastolic dysfunction on 03/11/17;hypertension; diabetes; visual disturbance with worsening shortness of breath. Patient was admitted with CHF exacerbation and started on IV Lasix.   Assessment & Plan:   Principal Problem:   Acute on chronic diastolic CHF (congestive heart failure) (HCC) Active Problems:   Diabetes mellitus type 2, controlled (HCC)   Essential hypertension   Anemia   Bladder spasm   Acute and chronic diastolic CHF exacerbation - continue Lasix 40 mg IV twice a day. Strict input and output. Daily weights. Fluid restriction. Negative balance of 1451 mL since admission - Follow creatinine closely - Continue aspirin, ARB and metoprolol - patient follows up with the cardiologist at [facility]; follow-up with him upon discharge. If patient's symptoms don't improve with diuresis, will get in patient cardiology evaluation.  DM -Hold Invokana and Metformin -Cover with moderate-scale SSI -A1c in 7/18 was 6.9  HTN -As above, continue Lopressor, lasix and ARB. Monitor BP  Anemia -hemoglobin stable. Monitor  Chronic kidney disease stage III - Monitor creatinine while on iv diuretics  Hypokalemia - Will replace and repeat a.m. labs  DVT prophylaxis: Lovenox Code Status:  full Family Communication: none at bedside Disposition Plan:home in 1-2 days  Consultants: none  Procedures: none  Antimicrobials: none    Subjective: Patient seen and examined at bedside. She feels slightly better but is still short of breath. No overnight fever, nausea or vomiting. No current chest pain  Objective: Vitals:   06/01/17 2150 06/02/17 0236 06/02/17 0314 06/02/17 0959    BP:  (!) 140/53  (!) 110/32  Pulse:  87  97  Resp:      Temp:  98.1 F (36.7 C)    TempSrc:  Oral    SpO2: 99% 97%    Weight:   101.9 kg (224 lb 9.6 oz)   Height:        Intake/Output Summary (Last 24 hours) at 06/02/17 1009 Last data filed at 06/02/17 0657  Gross per 24 hour  Intake              474 ml  Output             1925 ml  Net            -1451 ml   Filed Weights   06/01/17 2029 06/02/17 0314  Weight: 102.7 kg (226 lb 6.4 oz) 101.9 kg (224 lb 9.6 oz)    Examination:  General exam: Appears calm and comfortable  Respiratory system: Bilateral decreased breath sound at bases with basilar crackles Cardiovascular system: S1 & S2 heard, rate controlled  Gastrointestinal system: Abdomen is nondistended, soft and nontender. Normal bowel sounds heard. Extremities: No cyanosis, clubbing; 1-2+ pitting edema   Data Reviewed: I have personally reviewed following labs and imaging studies  CBC:  Recent Labs Lab 06/01/17 1230 06/02/17 0231  WBC 9.6 6.4  NEUTROABS  --  5.3  HGB 9.6* 9.0*  HCT 31.6* 29.8*  MCV 99.4 97.4  PLT 300 265   Basic Metabolic Panel:  Recent Labs Lab 05/28/17 1043 06/01/17 1230 06/02/17 0234  NA 144 141 141  K 3.4* 3.9 3.4*  CL 105 105 106  CO2 GLUCOSE 126* 158* 145*  BUN 22 24* 21*  CREATININE 1.18 1.09* 1.28*  CALCIUM 8.6 8.7* 8.4*   GFR: Estimated Creatinine Clearance: 38.4 mL/min (A) (by C-G formula based on SCr of 1.28 mg/dL (H)). Liver Function Tests:  Recent Labs Lab 06/01/17 1230  AST 25  ALT 21  ALKPHOS 59  BILITOT 0.7  PROT 6.6  ALBUMIN 3.3*   No results for input(s): LIPASE, AMYLASE in the last 168 hours. No results for input(s): AMMONIA in the last 168 hours. Coagulation Profile: No results for input(s): INR, PROTIME in the last 168 hours. Cardiac Enzymes:  Recent Labs Lab 06/01/17 1230  TROPONINI <0.03   BNP (last 3 results) No results for input(s): PROBNP in the last 8760 hours. HbA1C: No  results for input(s): HGBA1C in the last 72 hours. CBG:  Recent Labs Lab 06/01/17 2150 06/02/17 0736  GLUCAP 122* 100*   Lipid Profile: No results for input(s): CHOL, HDL, LDLCALC, TRIG, CHOLHDL, LDLDIRECT in the last 72 hours. Thyroid Function Tests: No results for input(s): TSH, T4TOTAL, FREET4, T3FREE, THYROIDAB in the last 72 hours. Anemia Panel:  Recent Labs  06/02/17 0232  VITAMINB12 400   Sepsis Labs: No results for input(s): PROCALCITON, LATICACIDVEN in the last 168 hours.  No results found for this or any previous visit (from the past 240 hour(s)).       Radiology Studies: Dg Chest 2 View  Result Date: 06/01/2017 CLINICAL DATA:  Ionic shortness of breath with increased symptoms over the past week. History of diabetes, coronary artery disease with previous MI and pulmonary edema and pleural effusions. Never smoked. EXAM: CHEST  2 VIEW COMPARISON:  Chest x-ray of May 27, 2017 FINDINGS: The lungs are reasonably well inflated. Bilateral pleural effusions persist greatest on the left. The heart is top-normal in size. The central pulmonary vascularity is mildly prominent. There is calcification in the wall of the aortic arch. The observed bony thorax is unremarkable. IMPRESSION: Persistent small or moderate bilateral pleural effusions. Mild pulmonary vascular congestion and interstitial prominence consistent with CHF. There has not been dramatic interval change since the previous study. Thoracic aortic atherosclerosis. Electronically Signed   By: David  [place] M.D.   On: 06/01/2017 12:16        Scheduled Meds: . aspirin EC  81 mg Oral Daily  . atorvastatin  40 mg Oral q1800  . clopidogrel  75 mg Oral Daily  . enoxaparin (LOVENOX) injection  40 mg Subcutaneous Q24H  . furosemide  40 mg Intravenous Q12H  . insulin aspart  0-15 Units Subcutaneous TID WC  . irbesartan  300 mg Oral Daily  . metoprolol tartrate  25 mg Oral BID  . oxybutynin  5 mg Oral TID  . sodium  chloride flush  3 mL Intravenous Q12H   Continuous Infusions: . sodium chloride       LOS: 0 days        Glade Lloyd, MD [facility] Pager [phone]  If 7PM-7AM, please contact night-coverage [email or web address] Password [email or web address] 06/02/2017, 10:09 AM

## 2017-06-02 NOTE — Care Management Note (Signed)
Case Management Note  Patient Details  Name: Elizabeth [name] MRN: [id number] Date of Birth: [birth year]  Subjective/Objective:                 Patient coming from home, lives with husband. Patient's husband drives to appointments/ pharmacy. Patient denies problems getting medication or getting to MD. Patient denies use of DME at home, is not dependent on home oxygen but is on supplemental O2 currently. Patient describes herself as independent at home.  PCP Dr Carmelia Roller Coverage [facility] Medicare   Action/Plan:  CM will continue to follow.  Expected Discharge Date:                  Expected Discharge Plan:  Home/Self Care  In-House Referral:     Discharge planning Services  CM Consult  Post Acute Care Choice:    Choice offered to:     DME Arranged:    DME Agency:     HH Arranged:    HH Agency:     Status of Service:  In process, will continue to follow  If discussed at Long Length of Stay Meetings, dates discussed:    Additional Comments:  Lawerance Sabal, RN 06/02/2017, 9:22 AM

## 2017-06-02 NOTE — Progress Notes (Signed)
Patient admitted by me last night.  Called by nurse for BP 91/51 at 1943.  Has orders for metoprolol and Lasix and patient is requesting to hold one of these medications.  Since she is admitted for CHF exacerbation, Lasix would not generally be held.  However, she has had a very good response to diuresis and her creatinine was trending up this AM.  Additionally, her BP has been low normal all day.  Will hold both Lasix and metoprolol for tonight.  Georgana Curio, M.D.

## 2017-06-02 NOTE — Evaluation (Signed)
Physical Therapy Evaluation Patient Details Name: Elizabeth [name] MRN: [id number] DOB: 1935-05-27 Today's Date: 06/02/2017   History of Present Illness  82 y.o. female with medical history significant of CAD; diastolic heart failure (grade 1 on 7/18 echo); HTN; DM; and visual disturbance presenting with SOB.   Clinical Impression  Pt admitted with above diagnosis. Pt currently with functional limitations due to the deficits listed below (see PT Problem List). At the time of PT eval pt was able to perform transfers and ambulation with gross min guard to min assist for balance support and safety. Pt declining use of RW however after gait training, educated pt on benefits of BUE support to decrease risk of falls as well as for energy conservation. Pt was agreeable to try RW next session. Noted O2 sats decreased during ambulation on RA. See below for details. Pt will benefit from skilled PT to increase their independence and safety with mobility to allow discharge to the venue listed below.     SATURATION QUALIFICATIONS: (This note is used to comply with regulatory documentation for home oxygen)  Patient Saturations on Room Air at Rest = 90%  Patient Saturations on Room Air while Ambulating = 83%  Patient Saturations on 1 Liters of oxygen while at rest = 97%  Please briefly explain why patient needs home oxygen: Pt is unable to maintain O2 sats >87% during ambulation on RA.    Follow Up Recommendations Home health PT;Supervision for mobility/OOB    Equipment Recommendations  None recommended by PT    Recommendations for Other Services       Precautions / Restrictions Precautions Precautions: Fall Restrictions Weight Bearing Restrictions: No      Mobility  Bed Mobility               General bed mobility comments: Pt in recliner upon PT arrival  Transfers Overall transfer level: Needs assistance Equipment used: 1 person hand held assist Transfers: Sit to/from Stand Sit  to Stand: Min assist;Min guard         General transfer comment: Min guard to min assist for balance support and safety. Pt appears unsteady with difficulty maintaining standing balance at times.   Ambulation/Gait Ambulation/Gait assistance: Min assist Ambulation Distance (Feet): 75 Feet Assistive device: 1 person hand held assist Gait Pattern/deviations: Step-through pattern;Decreased stride length Gait velocity: Decreased Gait velocity interpretation: Below normal speed for age/gender General Gait Details: HHA for balance assist during ambulation. Pt declined using the RW however reaching out for railings in hall with free hand for added support. Pt required a standing rest break every ~25' due to SOB and fatigue.   Stairs            Wheelchair Mobility    Modified Rankin (Stroke Patients Only)       Balance Overall balance assessment: Needs assistance Sitting-balance support: No upper extremity supported;Feet supported Sitting balance-Leahy Scale: Good Sitting balance - Comments: Able to lean forward to adjust socks    Standing balance support: No upper extremity supported;During functional activity Standing balance-Leahy Scale: Fair Standing balance comment: Able to maintain static standing without UE support                             Pertinent Vitals/Pain Pain Assessment: Faces Faces Pain Scale: No hurt Pain Intervention(s): Monitored during session    Home Living Family/patient expects to be discharged to:: Private residence Living Arrangements: Spouse/significant other Available Help at Discharge: Family;Available  24 hours/day Type of Home: House Home Access: Stairs to enter Entrance Stairs-Rails: Right Entrance Stairs-Number of Steps: 3 Home Layout: One level Home Equipment: Tub bench;Grab bars - tub/shower;Grab bars - toilet;Walker - 2 wheels;Cane - single point;Bedside commode;Wheelchair - manual      Prior Function Level of  Independence: Needs assistance   Gait / Transfers Assistance Needed: Pt has RW and cane but does not use. Husband assists to guide her due to decreased vision  ADL's / Homemaking Assistance Needed: Performs ADLs and family assists with IADLs  Comments: Family is very supportive and provide support      Hand Dominance   Dominant Hand: Right    Extremity/Trunk Assessment   Upper Extremity Assessment Upper Extremity Assessment: Defer to OT evaluation    Lower Extremity Assessment Lower Extremity Assessment: Generalized weakness    Cervical / Trunk Assessment Cervical / Trunk Assessment: Normal Cervical / Trunk Exceptions: Increased body habitus  Communication   Communication: No difficulties  Cognition Arousal/Alertness: Awake/alert Behavior During Therapy: WFL for tasks assessed/performed Overall Cognitive Status: Within Functional Limits for tasks assessed                                        General Comments General comments (skin integrity, edema, etc.): Husband and granddaughter present throughout session. Provided EC handout and education.     Exercises     Assessment/Plan    PT Assessment Patient needs continued PT services  PT Problem List Decreased strength;Decreased range of motion;Decreased activity tolerance;Decreased balance;Decreased mobility;Decreased knowledge of use of DME;Decreased safety awareness;Decreased knowledge of precautions;Cardiopulmonary status limiting activity       PT Treatment Interventions DME instruction;Gait training;Stair training;Functional mobility training;Therapeutic activities;Therapeutic exercise;Neuromuscular re-education;Patient/family education    PT Goals (Current goals can be found in the Care Plan section)  Acute Rehab PT Goals Patient Stated Goal: Go home PT Goal Formulation: With patient Time For Goal Achievement: 06/09/17 Potential to Achieve Goals: Good    Frequency Min 3X/week   Barriers to  discharge        Co-evaluation               AM-PAC PT "6 Clicks" Daily Activity  Outcome Measure Difficulty turning over in bed (including adjusting bedclothes, sheets and blankets)?: None Difficulty moving from lying on back to sitting on the side of the bed? : Unable Difficulty sitting down on and standing up from a chair with arms (e.g., wheelchair, bedside commode, etc,.)?: Unable Help needed moving to and from a bed to chair (including a wheelchair)?: A Little Help needed walking in hospital room?: A Little Help needed climbing 3-5 steps with a railing? : A Lot 6 Click Score: 14    End of Session Equipment Utilized During Treatment: Gait belt;Oxygen Activity Tolerance: Patient limited by fatigue Patient left: in chair;with call bell/phone within reach Nurse Communication: Mobility status PT Visit Diagnosis: Unsteadiness on feet (R26.81);Difficulty in walking, not elsewhere classified (R26.2)    Time: 1914-7829 PT Time Calculation (min) (ACUTE ONLY): 22 min   Charges:   PT Evaluation $PT Eval Moderate Complexity: 1 Mod     PT G Codes:   PT G-Codes **NOT FOR INPATIENT CLASS** Functional Assessment Tool Used: Clinical judgement Functional Limitation: Mobility: Walking and moving around Mobility: Walking and Moving Around Current Status (F6213): At least 40 percent but less than 60 percent impaired, limited or restricted Mobility: Walking and Moving  Around Goal Status [phone]): At least 20 percent but less than 40 percent impaired, limited or restricted    Conni Slipper, PT, DPT Acute Rehabilitation Services Pager: [phone]   Marylynn Pearson 06/02/2017, 2:54 PM

## 2017-06-03 LAB — CBC WITH DIFFERENTIAL/PLATELET
Basophils Absolute: 0 10*3/uL (ref 0.0–0.1)
Basophils Relative: 0 %
Eosinophils Absolute: 0.1 10*3/uL (ref 0.0–0.7)
Eosinophils Relative: 2 %
HEMATOCRIT: 27.8 % — AB (ref 36.0–46.0)
HEMOGLOBIN: 8.5 g/dL — AB (ref 12.0–15.0)
LYMPHS ABS: 1.1 10*3/uL (ref 0.7–4.0)
LYMPHS PCT: 19 %
MCH: 29.5 pg (ref 26.0–34.0)
MCHC: 30.6 g/dL (ref 30.0–36.0)
MCV: 96.5 fL (ref 78.0–100.0)
MONOS PCT: 13 %
Monocytes Absolute: 0.8 10*3/uL (ref 0.1–1.0)
NEUTROS ABS: 3.8 10*3/uL (ref 1.7–7.7)
NEUTROS PCT: 66 %
Platelets: 257 10*3/uL (ref 150–400)
RBC: 2.88 MIL/uL — AB (ref 3.87–5.11)
RDW: 16.4 % — ABNORMAL HIGH (ref 11.5–15.5)
WBC: 5.7 10*3/uL (ref 4.0–10.5)

## 2017-06-03 LAB — GLUCOSE, CAPILLARY
GLUCOSE-CAPILLARY: 150 mg/dL — AB (ref 65–99)
GLUCOSE-CAPILLARY: 174 mg/dL — AB (ref 65–99)
GLUCOSE-CAPILLARY: 171 mg/dL — AB (ref 65–99)
Glucose-Capillary: 137 mg/dL — ABNORMAL HIGH (ref 65–99)

## 2017-06-03 LAB — BASIC METABOLIC PANEL
Anion gap: 6 (ref 5–15)
BUN: 31 mg/dL — ABNORMAL HIGH (ref 6–20)
CALCIUM: 8.2 mg/dL — AB (ref 8.9–10.3)
CO2: 28 mmol/L (ref 22–32)
CREATININE: 1.49 mg/dL — AB (ref 0.44–1.00)
Chloride: 104 mmol/L (ref 101–111)
GFR calc non Af Amer: 32 mL/min — ABNORMAL LOW (ref >60)
GFR, EST AFRICAN AMERICAN: 37 mL/min — AB (ref >60)
GLUCOSE: 132 mg/dL — AB (ref 65–99)
Potassium: 4.3 mmol/L (ref 3.5–5.1)
Sodium: 138 mmol/L (ref 135–145)

## 2017-06-03 LAB — FOLATE RBC
FOLATE, RBC: 1548 ng/mL (ref >498)
Folate, Hemolysate: 441.3 ng/mL
HEMATOCRIT: 28.5 % — AB (ref 34.0–46.6)

## 2017-06-03 LAB — MAGNESIUM: Magnesium: 2.2 mg/dL (ref 1.7–2.4)

## 2017-06-03 MED ORDER — FUROSEMIDE 10 MG/ML IJ SOLN
40.0000 mg | Freq: Two times a day (BID) | INTRAMUSCULAR | Status: DC
  Filled 2017-06-03: qty 4

## 2017-06-03 MED ORDER — FUROSEMIDE 10 MG/ML IJ SOLN
20.0000 mg | Freq: Two times a day (BID) | INTRAMUSCULAR | Status: DC
  Administered 2017-06-03 – 2017-06-04 (×2): 20 mg via INTRAVENOUS
  Filled 2017-06-03 (×2): qty 2

## 2017-06-03 NOTE — Progress Notes (Addendum)
Pt BP 103/25- paged physician Returned page- hold lasix- recheck BP in 2 hours and give Metoprolol.  If BP goes up, give Lasix at that time.

## 2017-06-03 NOTE — Progress Notes (Signed)
Occupational Therapy Treatment Patient Details Name: Elizabeth [name] MRN: [id number] DOB: [birth year] Today's Date: 06/03/2017    History of present illness 82 y.o. female with medical history significant of CAD; diastolic heart failure (grade 1 on 7/18 echo); HTN; DM; and visual disturbance presenting with SOB.    OT comments  Pt demonstrating progress toward OT goals. She was able to independently verbalize one strategy to conserve energy and required VC's to determine a second. Continued education concerning energy conservation strategies related to ADL and IADL participation. She was able to complete ambulating toilet transfers and standing grooming tasks with min guard assist with RW this session and demonstrates improved stability with B UE support during dynamic standing tasks. OT will continue to follow while admitted.    Follow Up Recommendations  Supervision/Assistance - 24 hour;No OT follow up    Equipment Recommendations  None recommended by OT    Recommendations for Other Services      Precautions / Restrictions Precautions Precautions: Fall Restrictions Weight Bearing Restrictions: No       Mobility Bed Mobility               General bed mobility comments: OOB in chair on arrival  Transfers Overall transfer level: Needs assistance Equipment used: Rolling walker (2 wheeled) Transfers: Sit to/from Stand Sit to Stand: Min guard         General transfer comment: Min guard assist for safety. Pt very unstable.     Balance Overall balance assessment: Needs assistance Sitting-balance support: No upper extremity supported;Feet supported Sitting balance-Leahy Scale: Good Sitting balance - Comments: Able to lean forward to adjust socks    Standing balance support: No upper extremity supported;During functional activity;Bilateral upper extremity supported Standing balance-Leahy Scale: Fair Standing balance comment: Requires UE support for dynamic standing  tasks.                            ADL either performed or assessed with clinical judgement   ADL Overall ADL's : Needs assistance/impaired     Grooming: Wash/dry hands;Oral care;Standing;Min guard                   Toilet Transfer: Min guard;Ambulation;Cueing for safety;BSC Toilet Transfer Details (indicate cue type and reason): Min guard assist for stability. Toileting- Clothing Manipulation and Hygiene: Set up;Sitting/lateral lean       Functional mobility during ADLs: Min guard;Rolling walker General ADL Comments: Pt requiring cues for safe use of RW.      Vision   Additional Comments: Legally blind at baseline   Perception     Praxis      Cognition Arousal/Alertness: Awake/alert Behavior During Therapy: WFL for tasks assessed/performed Overall Cognitive Status: Within Functional Limits for tasks assessed                                          Exercises     Shoulder Instructions       General Comments      Pertinent Vitals/ Pain       Pain Assessment: Faces Faces Pain Scale: Hurts a little bit Pain Location: back Pain Intervention(s): Monitored during session  Home Living  Prior Functioning/Environment              Frequency  Min 2X/week        Progress Toward Goals  OT Goals(current goals can now be found in the care plan section)  Progress towards OT goals: Progressing toward goals  Acute Rehab OT Goals Patient Stated Goal: Go home OT Goal Formulation: With patient Time For Goal Achievement: 06/16/17 Potential to Achieve Goals: Good  Plan Discharge plan remains appropriate    Co-evaluation                 AM-PAC PT "6 Clicks" Daily Activity     Outcome Measure   Help from another person eating meals?: None Help from another person taking care of personal grooming?: A Little Help from another person toileting, which includes  using toliet, bedpan, or urinal?: A Little Help from another person bathing (including washing, rinsing, drying)?: A Little Help from another person to put on and taking off regular upper body clothing?: A Little Help from another person to put on and taking off regular lower body clothing?: A Little 6 Click Score: 19    End of Session Equipment Utilized During Treatment: Rolling walker  OT Visit Diagnosis: Unsteadiness on feet (R26.81);Other abnormalities of gait and mobility (R26.89);Muscle weakness (generalized) (M62.81)   Activity Tolerance Patient tolerated treatment well   Patient Left in chair;with call bell/phone within reach;with family/visitor present   Nurse Communication Mobility status        Time: [id number] OT Time Calculation (min): 17 min  Charges: OT General Charges $OT Visit: 1 Visit OT Treatments $Self Care/Home Management : 8-22 mins  Doristine Sectionharity A Heidi Maclin, MS OTR/L  Pager: [phone]    [name] A [name] 06/03/2017, 2:16 PM

## 2017-06-03 NOTE — Progress Notes (Signed)
Patient ID: Elizabeth [name], female   DOB: 1934/11/09, 82 y.o.   MRN: [id number]  PROGRESS NOTE    Elizabeth [name]  WUJ:[id number] DOB: 1934/11/09 DOA: 06/01/2017 PCP: Sharlene DoryWendling, Nicholas Paul, DO   Brief Narrative:  [age] year female with history of CAD; diastolic heart failure with EF of 60-65% and grade 1 diastolic dysfunction on 03/11/17;hypertension; diabetes; visual disturbance with worsening shortness of breath. Patient was admitted with CHF exacerbation and started on IV Lasix.   Assessment & Plan:   Acute and chronic diastolic CHF exacerbation -clinically improving, over 2 L negative, still with crackles -Continue IV Lasix 40 mg twice a day with holding parameters for blood pressure less than 100 systolic -Educated about salt restriction and daily weights etc. -Start ARB, continue metoprolol -Echocardiogram with normal ejection fraction and grade 1 diastolic dysfunction in July 2018 -monitor creatinine and blood pressure closely  DM -Hold Invokana and Metformin -Cover with moderate-scale SSI -A1c in 7/18 was 6.9  HTN -blood pressure lying much softer, will stop her ARB to allow room for diuresis, continue metoprolol  Anemia -hemoglobin stable. Monitor  Chronic kidney disease stage III - Monitor creatinine while on iv diuretics -slight bump in creatinine noted however remains clinically volume overloaded, we'll hold the ARB, continue Lasix today -bmet in am  Hypokalemia - repleted  DVT prophylaxis: Lovenox Code Status:  full Family Communication: none at bedside Disposition Plan:home tomorrow if stable  Consultants: none  Procedures: none  Antimicrobials: none    Subjective: -breathing improving, some shortness of breath with activity  Objective: Vitals:   06/03/17 0420 06/03/17 0900 06/03/17 1209 06/03/17 1214  BP: (!) 120/41 (!) 103/25 (!) 99/39   Pulse: 70 68 (!) 53   Resp: 18  18   Temp: 98 F (36.7 C)  98.2 F (36.8 C)   TempSrc: Oral  Oral     SpO2: 98% 100% (!) 89% 91%  Weight: 103.5 kg (228 lb 1.6 oz)     Height:        Intake/Output Summary (Last 24 hours) at 06/03/17 1316 Last data filed at 06/03/17 0200  Gross per 24 hour  Intake                0 ml  Output              300 ml  Net             -300 ml   Filed Weights   06/01/17 2029 06/02/17 0314 06/03/17 0420  Weight: 102.7 kg (226 lb 6.4 oz) 101.9 kg (224 lb 9.6 oz) 103.5 kg (228 lb 1.6 oz)    Examination:  Gen: Awake, Alert, Oriented X 3,  HEENT: PERRLA, Neck supple, no JVD Lungs: fine basilar crackles CVS: RRR,No Gallops,Rubs or new Murmurs Abd: soft, Non tender, non distended, BS present Extremities:1+ pitting edema bilaterally Skin: no new rashes  Data Reviewed: I have personally reviewed following labs and imaging studies  CBC:  Recent Labs Lab 06/01/17 1230 06/02/17 0231 06/03/17 0545  WBC 9.6 6.4 5.7  NEUTROABS  --  5.3 3.8  HGB 9.6* 9.0* 8.5*  HCT 31.6* 29.8* 27.8*  MCV 99.4 97.4 96.5  PLT 300 265 257   Basic Metabolic Panel:  Recent Labs Lab 05/28/17 1043 06/01/17 1230 06/02/17 0234 06/03/17 0545  NA 144 141 141 138  K 3.4* 3.9 3.4* 4.3  CL 105 105 106 104  CO2 29 28 29 28   GLUCOSE 126* 158* 145* 132*  BUN  22 24* 21* 31*  CREATININE 1.18 1.09* 1.28* 1.49*  CALCIUM 8.6 8.7* 8.4* 8.2*  MG  --   --   --  2.2   GFR: Estimated Creatinine Clearance: 33.3 mL/min (A) (by C-G formula based on SCr of 1.49 mg/dL (H)). Liver Function Tests:  Recent Labs Lab 06/01/17 1230  AST 25  ALT 21  ALKPHOS 59  BILITOT 0.7  PROT 6.6  ALBUMIN 3.3*   No results for input(s): LIPASE, AMYLASE in the last 168 hours. No results for input(s): AMMONIA in the last 168 hours. Coagulation Profile: No results for input(s): INR, PROTIME in the last 168 hours. Cardiac Enzymes:  Recent Labs Lab 06/01/17 1230  TROPONINI <0.03   BNP (last 3 results) No results for input(s): PROBNP in the last 8760 hours. HbA1C: No results for input(s):  HGBA1C in the last 72 hours. CBG:  Recent Labs Lab 06/02/17 1224 06/02/17 1659 06/02/17 2131 06/03/17 0733 06/03/17 1134  GLUCAP 131* 123* 147* 137* 150*   Lipid Profile: No results for input(s): CHOL, HDL, LDLCALC, TRIG, CHOLHDL, LDLDIRECT in the last 72 hours. Thyroid Function Tests: No results for input(s): TSH, T4TOTAL, FREET4, T3FREE, THYROIDAB in the last 72 hours. Anemia Panel:  Recent Labs  06/02/17 0232  VITAMINB12 400   Sepsis Labs: No results for input(s): PROCALCITON, LATICACIDVEN in the last 168 hours.  No results found for this or any previous visit (from the past 240 hour(s)).       Radiology Studies: No results found.      Scheduled Meds: . aspirin EC  81 mg Oral Daily  . atorvastatin  40 mg Oral q1800  . clopidogrel  75 mg Oral Daily  . enoxaparin (LOVENOX) injection  40 mg Subcutaneous Q24H  . furosemide  20 mg Intravenous Q12H  . insulin aspart  0-15 Units Subcutaneous TID WC  . metoprolol tartrate  25 mg Oral BID  . oxybutynin  5 mg Oral TID  . sodium chloride flush  3 mL Intravenous Q12H   Continuous Infusions: . sodium chloride       LOS: 1 day        Zannie Cove, MD [facility] Page via [email or web address], password [email or web address]  If 7PM-7AM, please contact night-coverage [email or web address] Password [email or web address] 06/03/2017, 1:16 PM

## 2017-06-03 NOTE — Progress Notes (Signed)
Physical Therapy Treatment Patient Details Name: Elizabeth [name] MRN: [id number] DOB: 1935/05/01 Today's Date: 06/03/2017    History of Present Illness 82 y.o. female with medical history significant of CAD; diastolic heart failure (grade 1 on 7/18 echo); HTN; DM; and visual disturbance presenting with SOB.     PT Comments    Patient is "furniture walker" with no assistive device.  Balance/safety improved with use of RW.  Recommend RW for use at home.  Agree with HHPT f/u.   Follow Up Recommendations  Home health PT;Supervision for mobility/OOB     Equipment Recommendations  Rolling walker with 5" wheels    Recommendations for Other Services       Precautions / Restrictions Precautions Precautions: Fall Restrictions Weight Bearing Restrictions: No    Mobility  Bed Mobility               General bed mobility comments: OOB in chair on arrival  Transfers Overall transfer level: Needs assistance Equipment used: Rolling walker (2 wheeled) Transfers: Sit to/from Stand Sit to Stand: Min guard         General transfer comment: Min guard for safety. Needs RW for balance in stance.  Ambulation/Gait Ambulation/Gait assistance: Min guard Ambulation Distance (Feet): 50 Feet Assistive device: Rolling walker (2 wheeled) Gait Pattern/deviations: Step-through pattern;Decreased stride length;Trunk flexed Gait velocity: Decreased Gait velocity interpretation: Below normal speed for age/gender General Gait Details: Verbal cues on safe use of RW.  Ambulated with RW and min guard assist for safety.  More steady with RW.  Needed verbal cues to maneuver around obstacles during turns.   Stairs            Wheelchair Mobility    Modified Rankin (Stroke Patients Only)       Balance           Standing balance support: No upper extremity supported Standing balance-Leahy Scale: Fair Standing balance comment: Requires UE support for dynamic standing tasks.                             Cognition Arousal/Alertness: Awake/alert Behavior During Therapy: WFL for tasks assessed/performed Overall Cognitive Status: Within Functional Limits for tasks assessed                                        Exercises      General Comments        Pertinent Vitals/Pain Pain Assessment: No/denies pain    Home Living                      Prior Function            PT Goals (current goals can now be found in the care plan section) Acute Rehab PT Goals Patient Stated Goal: Go home Progress towards PT goals: Progressing toward goals    Frequency    Min 3X/week      PT Plan Current plan remains appropriate;Equipment recommendations need to be updated    Co-evaluation              AM-PAC PT "6 Clicks" Daily Activity  Outcome Measure  Difficulty turning over in bed (including adjusting bedclothes, sheets and blankets)?: None Difficulty moving from lying on back to sitting on the side of the bed? : Unable Difficulty sitting down on and standing up from a  chair with arms (e.g., wheelchair, bedside commode, etc,.)?: A Little Help needed moving to and from a bed to chair (including a wheelchair)?: A Little Help needed walking in hospital room?: A Little Help needed climbing 3-5 steps with a railing? : A Lot 6 Click Score: 16    End of Session Equipment Utilized During Treatment: Gait belt Activity Tolerance: Patient tolerated treatment well;Patient limited by fatigue Patient left: in chair;with call bell/phone within reach;with chair alarm set;with family/visitor present Nurse Communication: Mobility status PT Visit Diagnosis: Unsteadiness on feet (R26.81);Difficulty in walking, not elsewhere classified (R26.2)     Time: [id number] PT Time Calculation (min) (ACUTE ONLY): 19 min  Charges:  $Gait Training: 8-22 mins                    G Codes:       Durenda HurtSusan H. Renaldo Fiddleravis, PT, [facility] Acute Rehab Services Pager  [phone]    [name] 06/03/2017, 6:36 PM

## 2017-06-03 NOTE — Plan of Care (Signed)
Problem: Safety: Goal: Ability to remain free from injury will improve Outcome: Progressing Bed in lowest position. Call bell within reach. Bed alarm activated.

## 2017-06-04 LAB — CBC
HCT: 30.5 % — ABNORMAL LOW (ref 36.0–46.0)
Hemoglobin: 9.5 g/dL — ABNORMAL LOW (ref 12.0–15.0)
MCH: 29.8 pg (ref 26.0–34.0)
MCHC: 31.1 g/dL (ref 30.0–36.0)
MCV: 95.6 fL (ref 78.0–100.0)
PLATELETS: 265 10*3/uL (ref 150–400)
RBC: 3.19 MIL/uL — ABNORMAL LOW (ref 3.87–5.11)
RDW: 15.9 % — AB (ref 11.5–15.5)
WBC: 7.4 10*3/uL (ref 4.0–10.5)

## 2017-06-04 LAB — BASIC METABOLIC PANEL
Anion gap: 12 (ref 5–15)
BUN: 35 mg/dL — AB (ref 6–20)
CHLORIDE: 101 mmol/L (ref 101–111)
CO2: 24 mmol/L (ref 22–32)
CREATININE: 1.47 mg/dL — AB (ref 0.44–1.00)
Calcium: 8.5 mg/dL — ABNORMAL LOW (ref 8.9–10.3)
GFR calc Af Amer: 38 mL/min — ABNORMAL LOW (ref >60)
GFR calc non Af Amer: 32 mL/min — ABNORMAL LOW (ref >60)
GLUCOSE: 133 mg/dL — AB (ref 65–99)
Potassium: 4.2 mmol/L (ref 3.5–5.1)
SODIUM: 137 mmol/L (ref 135–145)

## 2017-06-04 LAB — GLUCOSE, CAPILLARY: Glucose-Capillary: 145 mg/dL — ABNORMAL HIGH (ref 65–99)

## 2017-06-04 MED ORDER — FUROSEMIDE 40 MG PO TABS: 40.0000 mg | ORAL_TABLET | Freq: Every day | ORAL | 1 refills | Status: DC

## 2017-06-04 NOTE — Progress Notes (Signed)
Discharge instructions (including medications) discussed with and copy provided to patient/caregiver 

## 2017-06-04 NOTE — Care Management Note (Signed)
Case Management Note  Patient Details  Name: Elizabeth [name] MRN: [id number] Date of Birth: 03-17-1935  Subjective/Objective:     CHF              Action/Plan: Patient lives at home with spouse; PCP: Sharlene DoryWendling, Nicholas Paul, DO; [facility] with [facility] drug coverage; [facility] choice offered, [facility] Mary with [facility] called for arrangements; Rolling walker with seat ordered as requested.  Expected Discharge Date:  06/04/17               Expected Discharge Plan:  Home w Home Health Services  Discharge planning Services  CM Consult  Post Acute Care Choice:  Home Health Choice offered to:  Patient  DME Arranged:  Walker rolling with seat DME Agency:  [facility]  HH Arranged:  RN, PT, Disease Management [facility]  Status of Service:  In process, will continue to follow  Reola MosherChandler, Dalary Hollar L, RN,MHA,BSN [phone] 06/04/2017, 11:00 AM

## 2017-06-05 ENCOUNTER — Telehealth: Payer: Self-pay | Admitting: Behavioral Health

## 2017-06-05 NOTE — Telephone Encounter (Signed)
Transition Care Management Follow-up Telephone Call   Date discharged? 06/04/17   How have you been since you were released from the hospital? Patient stated, "I'm doing okay".   Do you understand why you were in the hospital? yes   Do you understand the discharge instructions? yes   Where were you discharged to? Home   Items Reviewed:  Medications reviewed: yes  Allergies reviewed: yes  Dietary changes reviewed: yes, low-sodium diet  Referrals reviewed: yes, Follow-up with PCP in 1 week.   Functional Questionnaire:   Activities of Daily Living (ADLs):   She states they are independent in the following: ambulation, bathing and hygiene, feeding, continence, grooming, toileting and dressing States they require assistance with the following: None   Any transportation issues/concerns?: no   Any patient concerns? no   Confirmed importance and date/time of follow-up visits scheduled yes, 06/10/17 at 11:15 AM.  Provider Appointment booked with Dr. Carmelia RollerWendling.  Confirmed with patient if condition begins to worsen call PCP or go to the ER.  Patient was given the office number and encouraged to call back with question or concerns.  : yes

## 2017-06-08 ENCOUNTER — Telehealth: Payer: Self-pay | Admitting: Family Medicine

## 2017-06-08 NOTE — Telephone Encounter (Signed)
Called HHRN informed verbal ok per PCP 

## 2017-06-08 NOTE — Telephone Encounter (Signed)
Ardelle AntonPamela - [facility] [phone]- [phone]  She said that she would like to work with pt and educate her on her medications. She's notice that it seems like pt have a time with expressing herself.   She would like orders to work with pt.   Frequency: 1 week 1 for 9 weeks.    Please assist further.

## 2017-06-10 ENCOUNTER — Encounter: Payer: Self-pay | Admitting: Family Medicine

## 2017-06-10 ENCOUNTER — Ambulatory Visit (INDEPENDENT_AMBULATORY_CARE_PROVIDER_SITE_OTHER): Payer: Medicare Other | Admitting: Family Medicine

## 2017-06-10 VITALS — BP 102/68 | HR 66 | Temp 98.1°F | Ht 61.0 in | Wt 216.0 lb

## 2017-06-10 DIAGNOSIS — I5033 Acute on chronic diastolic (congestive) heart failure: Secondary | ICD-10-CM

## 2017-06-10 DIAGNOSIS — E11319 Type 2 diabetes mellitus with unspecified diabetic retinopathy without macular edema: Secondary | ICD-10-CM

## 2017-06-10 LAB — BASIC METABOLIC PANEL
BUN: 17 mg/dL (ref 6–23)
CALCIUM: 9.5 mg/dL (ref 8.4–10.5)
CO2: 35 meq/L — AB (ref 19–32)
CREATININE: 1.11 mg/dL (ref 0.40–1.20)
Chloride: 102 mEq/L (ref 96–112)
GFR: 50.25 mL/min — ABNORMAL LOW (ref >60.00)
Glucose, Bld: 148 mg/dL — ABNORMAL HIGH (ref 70–99)
Potassium: 5 mEq/L (ref 3.5–5.1)
SODIUM: 146 meq/L — AB (ref 135–145)

## 2017-06-10 LAB — CBC
HEMATOCRIT: 33.5 % — AB (ref 36.0–46.0)
HEMOGLOBIN: 10.7 g/dL — AB (ref 12.0–15.0)
MCHC: 31.9 g/dL (ref 30.0–36.0)
MCV: 96.2 fl (ref 78.0–100.0)
Platelets: 378 10*3/uL (ref 150.0–400.0)
RBC: 3.48 Mil/uL — ABNORMAL LOW (ref 3.87–5.11)
RDW: 15.8 % — AB (ref 11.5–15.5)
WBC: 6.3 10*3/uL (ref 4.0–10.5)

## 2017-06-10 MED ORDER — METFORMIN HCL ER 500 MG PO TB24: 500.0000 mg | ORAL_TABLET | Freq: Every day | ORAL | 3 refills | Status: DC

## 2017-06-10 NOTE — Progress Notes (Signed)
Chief Complaint  Patient presents with  . Hospitalization Follow-up    CHF    HPI Elizabeth [name] is a 82 y.o. y.o. female who presents for a transition of care visit.  [facility]  Within 48 business hours of discharge our office contacted her via telephone to coordinate her care and needs.   The patient was admitted for acute on chronic diastolic heart failure.  I saw her in the office twice before sending her to the emergency department for increasing weight and dyspnea on exertion.  She diuresed around 14 pounds of fluid and feels much better.  Her Lasix was increased to 40 mg daily from 20 mg daily.  She is tolerating the medicine well.  Unfortunately, there is no discharge summary as of today.  She does follow with a cardiologist and has an appointment scheduled in early November.  Of note, she did have a bout of diarrhea, which is a recurrent issue due to possibly being on metformin.  Social History   Social History  . Marital status: Married   Occupational History  . Retired Retired   Social History Main Topics  . Smoking status: Never Smoker  . Smokeless tobacco: Never Used  . Alcohol use No  . Drug use: No  . Sexual activity: No   Past Medical History:  Diagnosis Date  . Allergy   . CAD (coronary artery disease)   . Diabetes mellitus    type II  . Diabetic retinopathy    [facility]  . Gastroenteritis 02/13/2013  . History of GI bleed    secondary to heparin  . Hypertension   . LBP (low back pain)   . Muscle cramp 02/13/2013  . Pleural effusion   . Pulmonary edema   . Right hip pain 02/13/2013  . Shingles   . Tubal pregnancy    Family History  Problem Relation Age of Onset  . Stroke Father   . Coronary artery disease Unknown   . Heart disease Mother    Allergies as of 06/10/2017      Reactions   Sulfa Antibiotics Hives   Cyclobenzaprine Hcl Rash      Medication List       Accurate as of  06/10/17 12:24 PM. Always use your most recent med list.          acetaminophen 500 MG tablet Commonly known as:  TYLENOL Take 500 mg by mouth every 6 (six) hours as needed for headache (pain).   ASPIRIN LOW DOSE 81 MG EC tablet Generic drug:  aspirin TAKE 1 TABLET BY MOUTH DAILY   atorvastatin 40 MG tablet Commonly known as:  LIPITOR TAKE 1 TABLET(40 MG) BY MOUTH DAILY   clopidogrel 75 MG tablet Commonly known as:  PLAVIX TAKE 1 TABLET(75 MG) BY MOUTH EVERY MORNING   freestyle lancets Use as instructed to check blood sugar 3 times per day Diagnoses Code 250.00   furosemide 40 MG tablet Commonly known as:  LASIX Take 1 tablet (40 mg total) by mouth daily.   glucose blood test strip Use as instructed to check blood sugar 3 times per day Diagnoses Code 250.00   IMODIUM A-D 2 MG capsule Generic drug:  loperamide Take 2 mg by mouth as needed for diarrhea or loose stools.   INVOKANA 100 MG Tabs tablet Generic drug:  canagliflozin TAKE 1 TABLET BY MOUTH EVERY MORNING WITH BREAKFAST   metFORMIN 500 MG 24 hr  tablet Commonly known as:  GLUCOPHAGE-XR Take 1 tablet (500 mg total) by mouth daily with breakfast.   metoprolol tartrate 50 MG tablet Commonly known as:  LOPRESSOR TAKE 1/2 TABLET(25 MG) BY MOUTH TWICE DAILY   nitroGLYCERIN 0.4 MG SL tablet Commonly known as:  NITROSTAT Place 1 tablet (0.4 mg total) under the tongue every 5 (five) minutes as needed for chest pain.   OVER THE COUNTER MEDICATION Apply 1 application topically 2 (two) times daily as needed (eczema). "Exerderm"   PATADAY 0.2 % Soln Generic drug:  Olopatadine HCl Place 1 drop into both eyes daily.       ROS:  Constitutional: No fevers or chills, no weight loss HEENT: No headaches, hearing loss, or runny nose, no sore throat Heart: No chest pain Lungs: No SOB, no cough Abd: No bowel changes, no pain, no N/V GU: No urinary complaints Neuro: No numbness, tingling or weakness Msk: No joint or  muscle pain  Objective BP 102/68 (BP Location: Left Arm, Patient Position: Sitting, Cuff Size: Large)   Pulse 66   Temp 98.1 F (36.7 C) (Oral)   Ht 5\' 1"  (1.549 m)   Wt 216 lb (98 kg)   SpO2 93%   BMI 40.81 kg/m  General Appearance:  awake, alert, oriented, in no acute distress and well developed, well nourished Skin:  there are no suspicious lesions or rashes of concern Head/face:  NCAT Eyes:  EOMI, PERRLA Ears:  canals and TMs NI Nose/Sinuses:  negative Mouth/Throat:  Mucosa moist, no lesions; pharynx without erythema, edema or exudate. Neck:  neck- supple, no mass, non-tender and no jvd Lungs: Clear to auscultation.  No rales, rhonchi, or wheezing. Normal effort, no accessory muscle use. Heart:  Heart sounds are normal.  Regular rate and rhythm, gallop or rub. No bruits. Abdomen:  BS+, soft, NT, ND, no masses or organomegaly Musculoskeletal:  No muscle group atrophy or asymmetry, gait normal Neurologic:  Alert and oriented x 3, gait normal Psych exam: Nml mood and affect, age appropriate judgment and insight  Acute on chronic diastolic heart failure (HCC) - Plan: CBC, Basic metabolic panel  Controlled type 2 diabetes mellitus with retinopathy, without long-term current use of insulin, macular edema presence unspecified, unspecified laterality, unspecified retinopathy severity (HCC) - Plan: metFORMIN (GLUCOPHAGE-XR) 500 MG 24 hr tablet  Discharge summary was not complete at time of visit. Medication list has been reviewed/reconciled.  Labs pending at the time of discharge have been reviewed.  Follow-up labs and appointments have been ordered and/or coordinated appropriately. Educational materials regarding the patient's admitting diagnosis provided.  TRANSITIONAL CARE MANAGEMENT CERTIFICATION:  I certify the following are true:   1. Communication with the patient/care giver was made within 2 business days of discharge.  2. Complexity of Medical decision making is moderate.   3. Face to face visit occurred within 14 days of discharge.   F/u as originally scheduled in Jan. The patient voiced understanding and agreement to the plan.  Jilda Roche [place], DO 06/10/17 12:24 PM

## 2017-06-10 NOTE — Progress Notes (Signed)
Pre visit review using our clinic review tool, if applicable. No additional management support is needed unless otherwise documented below in the visit note. 

## 2017-06-10 NOTE — Patient Instructions (Signed)
Give us 2-3 business days to get the results of your labs back. If labs are normal, you will likely receive a letter in the mail unless you have MyChart. This can take longer than 2-3 business days.   Follow up with your cardiologist.  Let us know if you need anything.

## 2017-06-15 ENCOUNTER — Other Ambulatory Visit: Payer: Self-pay | Admitting: Family Medicine

## 2017-06-18 NOTE — Discharge Summary (Signed)
Physician Discharge Summary  Tanyika Barros Virella EXB:[id number] DOB: Nov 03, 1934 DOA: 06/01/2017  PCP: Sharlene Dory, DO  Admit date: 06/01/2017 Discharge date: 06/04/2017  Time spent: 35 minutes  Recommendations for Outpatient Follow-up:  PCP in 1 week Please check Bmet at FU, stop metformin if creatinine trends up further  Discharge Diagnoses:  Principal Problem:   Acute on chronic diastolic CHF (congestive heart failure) (HCC) Active Problems:   Diabetes mellitus type 2, controlled (HCC)   Essential hypertension   Anemia   Bladder spasm   Acute diastolic (congestive) heart failure (HCC)   Discharge Condition: stable  Diet recommendation: heart healthy  Filed Weights   06/02/17 0314 06/03/17 0420 06/04/17 0437  Weight: 101.9 kg (224 lb 9.6 oz) 103.5 kg (228 lb 1.6 oz) 104.3 kg (230 lb)    History of present illness:  82 year female with history of CAD; diastolic heart failure with EF of 60-65% and grade 1 diastolic dysfunction on 03/11/17;hypertension; diabetes; visual disturbance with worsening shortness of breath  Hospital Course:   Acute and chronic diastolic CHF exacerbation -clinically improved, diuresed with IV lasix -continue metoprolol, stopped exforge -Echocardiogram with normal ejection fraction and grade 1 diastolic dysfunction in July 2018 -changed to PO lasix -discharged home in a stable condition educated regarding salt restriction, daily weights  DM -Held Invokana and Metformin -Covered with moderate-scale SSI in hospital -A1c in 7/18 was 6.9 -discharged home on home regimen of metformin and Invokana  HTN -blood pressure lying much softer, will stop her ARB to allow room for diuresis, continue metoprolol  Anemia -hemoglobin stable. Monitor  Chronic kidney disease stage III -slight bump in creatinine noted with diuresis, ARB held -changed to Po lasix , ARB held at DC, consider low dose ARB at Follow up -creatinine stable at  1.4  Hypokalemia - repleted   Discharge Exam: Vitals:   06/04/17 0437 06/04/17 0850  BP: (!) 124/34 (!) 120/34  Pulse: 63 68  Resp: 20   Temp: 98.7 F (37.1 C)   SpO2: 100%     General: AAOx3 Cardiovascular:S1S2/RRR Respiratory: CTAB  Discharge Instructions   Discharge Instructions    Diet - low sodium heart healthy    Complete by:  As directed    Diet Carb Modified    Complete by:  As directed    Increase activity slowly    Complete by:  As directed      Discharge Medication List as of 06/04/2017 10:26 AM    CONTINUE these medications which have CHANGED   Details  furosemide (LASIX) 40 MG tablet Take 1 tablet (40 mg total) by mouth daily., Starting Thu 06/04/2017, Print      CONTINUE these medications which have NOT CHANGED   Details  acetaminophen (TYLENOL) 500 MG tablet Take 500 mg by mouth every 6 (six) hours as needed for headache (pain)., Historical Med    ASPIRIN LOW DOSE 81 MG EC tablet TAKE 1 TABLET BY MOUTH DAILY, Normal    atorvastatin (LIPITOR) 40 MG tablet TAKE 1 TABLET(40 MG) BY MOUTH DAILY, Normal    INVOKANA 100 MG TABS tablet TAKE 1 TABLET BY MOUTH EVERY MORNING WITH BREAKFAST, Normal    loperamide (IMODIUM A-D) 2 MG capsule Take 2 mg by mouth as needed for diarrhea or loose stools., Historical Med    metoprolol tartrate (LOPRESSOR) 50 MG tablet TAKE 1/2 TABLET(25 MG) BY MOUTH TWICE DAILY, Normal    nitroGLYCERIN (NITROSTAT) 0.4 MG SL tablet Place 1 tablet (0.4 mg total) under the tongue  every 5 (five) minutes as needed for chest pain., Starting Wed 07/09/2016, Print    Olopatadine HCl (PATADAY) 0.2 % SOLN Place 1 drop into both eyes daily., Historical Med    OVER THE COUNTER MEDICATION Apply 1 application topically 2 (two) times daily as needed (eczema). "Exerderm", Historical Med    clopidogrel (PLAVIX) 75 MG tablet TAKE 1 TABLET(75 MG) BY MOUTH EVERY MORNING, Normal    metFORMIN (GLUCOPHAGE-XR) 750 MG 24 hr tablet TAKE 1 TABLET BY  MOUTH EVERY MORNING WITH BREAKFAST, Normal    ondansetron (ZOFRAN) 4 MG tablet Take 4 mg by mouth every 8 (eight) hours as needed for nausea or vomiting., Historical Med    triamcinolone cream (KENALOG) 0.1 % Apply 1 application topically 2 (two) times daily as needed (eczema). , Starting Wed 08/20/2016, Historical Med    glucose blood test strip Use as instructed to check blood sugar 3 times per day Diagnoses Code 250.00, Normal    Lancets (FREESTYLE) lancets Use as instructed to check blood sugar 3 times per day Diagnoses Code 250.00, Print      STOP taking these medications     amLODipine-valsartan (EXFORGE) 5-320 MG tablet      potassium chloride (K-DUR,KLOR-CON) 10 MEQ tablet      gabapentin (NEURONTIN) 300 MG capsule      methocarbamol (ROBAXIN) 500 MG tablet        Allergies  Allergen Reactions  . Sulfa Antibiotics Hives  . Cyclobenzaprine Hcl Rash   Follow-up Information    Sharlene Dory, DO. Schedule an appointment as soon as possible for a visit in 1 week(s).   Specialty:  Family Medicine Why:  please check Bmet-for kidney function and potassium level in 1 week Contact information: [address] [place] [address] [phone]        [facility] Follow up.   Specialty:  Home Health Services Why:  They will do your home health care at your home Contact information: [address] [place] [address] [phone]        [facility] - Dme Follow up.   Why:  They will provide your a rolling walker with a seat for you to use at your home Contact information: [address] [place] [address] [phone]            The results of significant diagnostics from this hospitalization (including imaging, microbiology, ancillary and laboratory) are listed below for reference.    Significant Diagnostic Studies: Dg Chest 2 View  Result Date: 06/01/2017 CLINICAL DATA:  Ionic shortness of  breath with increased symptoms over the past week. History of diabetes, coronary artery disease with previous MI and pulmonary edema and pleural effusions. Never smoked. EXAM: CHEST  2 VIEW COMPARISON:  Chest x-ray of May 27, 2017 FINDINGS: The lungs are reasonably well inflated. Bilateral pleural effusions persist greatest on the left. The heart is top-normal in size. The central pulmonary vascularity is mildly prominent. There is calcification in the wall of the aortic arch. The observed bony thorax is unremarkable. IMPRESSION: Persistent small or moderate bilateral pleural effusions. Mild pulmonary vascular congestion and interstitial prominence consistent with CHF. There has not been dramatic interval change since the previous study. Thoracic aortic atherosclerosis. Electronically Signed   By: David  [place] M.D.   On: 06/01/2017 12:16   Dg Chest 2 View  Result Date: 05/27/2017 CLINICAL DATA:  82 year old female with shortness of Breath for a weaker more.  Cough and weakness. EXAM: CHEST  2 VIEW COMPARISON:  Chest radiographs 03/05/2017 and earlier. FINDINGS: Continued small to moderate bilateral pleural effusions with some fluid tracking into the pleural fissures. Stable cardiac size and mediastinal contours. Stable pulmonary vascularity. No areas of worsening ventilation. Visualized tracheal air column is within normal limits. Stable visualized osseous structures. Negative visible bowel gas pattern. IMPRESSION: Small to moderate bilateral pleural effusions and pulmonary vascular congestion/interstitial edema stable since July. Electronically Signed   By: Odessa FlemingH  Hall M.D.   On: 05/27/2017 15:07    Microbiology: No results found for this or any previous visit (from the past 240 hour(s)).   Labs: Basic Metabolic Panel: No results for input(s): NA, K, CL, CO2, GLUCOSE, BUN, CREATININE, CALCIUM, MG, PHOS in the last 168 hours. Liver Function Tests: No results for input(s): AST, ALT, ALKPHOS, BILITOT,  PROT, ALBUMIN in the last 168 hours. No results for input(s): LIPASE, AMYLASE in the last 168 hours. No results for input(s): AMMONIA in the last 168 hours. CBC: No results for input(s): WBC, NEUTROABS, HGB, HCT, MCV, PLT in the last 168 hours. Cardiac Enzymes: No results for input(s): CKTOTAL, CKMB, CKMBINDEX, TROPONINI in the last 168 hours. BNP: BNP (last 3 results)  Recent Labs  06/01/17 1230  BNP 277.2*    ProBNP (last 3 results) No results for input(s): PROBNP in the last 8760 hours.  CBG: No results for input(s): GLUCAP in the last 168 hours.     SignedZannie Cove:  Bradlee Bridgers MD.  [facility] 06/18/2017, 4:03 PM

## 2017-06-30 ENCOUNTER — Telehealth: Payer: Self-pay | Admitting: Family Medicine

## 2017-06-30 NOTE — Telephone Encounter (Signed)
[facility] [phone] called for verbal orders to decrease home health to one time per week for three weeks. Per Tresa EndoKelly she has secure voicemail so Carmelia RollerWendling can leave secure msg on her phone. [name] off today so routed to [employer]C. HoldingsWendling and [employer] Lowne Chase.

## 2017-07-01 ENCOUNTER — Other Ambulatory Visit: Payer: Self-pay | Admitting: Family Medicine

## 2017-07-01 DIAGNOSIS — E1165 Type 2 diabetes mellitus with hyperglycemia: Principal | ICD-10-CM

## 2017-07-01 DIAGNOSIS — IMO0002 Reserved for concepts with insufficient information to code with codable children: Secondary | ICD-10-CM

## 2017-07-01 DIAGNOSIS — E11319 Type 2 diabetes mellitus with unspecified diabetic retinopathy without macular edema: Secondary | ICD-10-CM

## 2017-07-01 NOTE — Telephone Encounter (Signed)
Called HHRN left a detailed message of PCP verbal ok per request.

## 2017-07-31 ENCOUNTER — Other Ambulatory Visit: Payer: Self-pay | Admitting: Family Medicine

## 2017-07-31 DIAGNOSIS — E1165 Type 2 diabetes mellitus with hyperglycemia: Principal | ICD-10-CM

## 2017-07-31 DIAGNOSIS — E11319 Type 2 diabetes mellitus with unspecified diabetic retinopathy without macular edema: Secondary | ICD-10-CM

## 2017-07-31 DIAGNOSIS — IMO0002 Reserved for concepts with insufficient information to code with codable children: Secondary | ICD-10-CM

## 2017-08-26 ENCOUNTER — Ambulatory Visit: Payer: Medicare Other | Admitting: Family Medicine

## 2017-08-26 ENCOUNTER — Encounter: Payer: Self-pay | Admitting: Family Medicine

## 2017-08-26 VITALS — BP 134/86 | HR 75 | Temp 97.3°F | Ht 61.0 in | Wt 196.5 lb

## 2017-08-26 DIAGNOSIS — I5022 Chronic systolic (congestive) heart failure: Secondary | ICD-10-CM

## 2017-08-26 MED ORDER — FUROSEMIDE 40 MG PO TABS: 40.0000 mg | ORAL_TABLET | Freq: Every day | ORAL | 1 refills | Status: DC

## 2017-08-26 NOTE — Patient Instructions (Addendum)
Keep weighing yourself daily.  We will collect a urine sample and blood work (please fast) at your next visit.  Keep up the great work!

## 2017-08-26 NOTE — Progress Notes (Signed)
Chief Complaint  Patient presents with  . Follow-up    Subjective: Patient is a 83 y.o. female here for CHF f/u.  I saw her in October and she was diagnosed with CHF exacerbation.  She has been weighing herself daily and is compliant with her Lasix.  Her diet is healthy overall and she tries to stay physically active.  She is tolerating the medicine well.  She does urinate frequently as expected.  She denies any chest pain or shortness of breath.   ROS: Heart: Denies chest pain  Lungs: Denies SOB    Past Medical History:  Diagnosis Date  . Allergy   . CAD (coronary artery disease)   . Diabetes mellitus    type II  . Diabetic retinopathy    [facility]in [facility] (vitrectomy left [facility])  . Gastroenteritis 02/13/2013  . History of GI bleed    secondary to heparin  . Hypertension   . LBP (low back pain)   . Muscle cramp 02/13/2013  . Pleural effusion   . Pulmonary edema   . Right hip pain 02/13/2013  . Shingles   . Tubal pregnancy     Objective: BP 134/86 (BP Location: Left Arm, Patient Position: Sitting, Cuff Size: Large)   Pulse 75   Temp (!) 97.3 F (36.3 C) (Oral)   Ht 5\' 1"  (1.549 m)   Wt 196 lb 8 oz (89.1 kg)   SpO2 99%   BMI 37.13 kg/m  General: Awake, appears stated age HEENT: MMM, EOMi Heart: RRR, 1+ bilateral pitting edema that tapers at the proximal third of the tibia bilaterally Lungs: CTAB, no rales, wheezes or rhonchi. No accessory muscle use Abd: BS+, soft, NT, ND, no masses or organomegaly Psych: Age appropriate judgment and insight, normal affect and mood  Assessment and Plan: Chronic systolic congestive heart failure, NYHA class 1 (HCC) - Plan: furosemide (LASIX) 40 MG tablet  Orders as above.  Continue Lasix.  Continue weighing herself daily.  Her weight is much improved.  Counseled on diet and exercise which she is doing a good job of.  The swelling in her lower extremities is the best I have seen in several months. Follow-up in 1 month for a  dedicated diabetic exam. The patient voiced understanding and agreement to the plan.  Jilda Rocheicholas Paul LaceyWendling, DO 08/26/17  10:29 AM

## 2017-08-26 NOTE — Progress Notes (Signed)
Pre visit review using our clinic review tool, if applicable. No additional management support is needed unless otherwise documented below in the visit note. 

## 2017-08-27 ENCOUNTER — Telehealth: Payer: Self-pay | Admitting: Family Medicine

## 2017-08-27 NOTE — Telephone Encounter (Signed)
Copied from CRM [phone]. Topic: Quick Communication - See Telephone Encounter >> Aug 27, 2017  3:55 PM Cipriano BunkerLambe, Annette S wrote: CRM for notification. See Telephone encounter for:   [facility] - [phone] ext [phone] asking what was A1C reading & when done, also what was the ejection fraction % on how much heart was pumping.   This is 2nd request 08/27/17.

## 2017-08-28 NOTE — Telephone Encounter (Signed)
For office review and call back

## 2017-08-28 NOTE — Telephone Encounter (Signed)
Called [employer] left detailed message of information requesting Last ac 03/05/17 was 6.9

## 2017-08-28 NOTE — Telephone Encounter (Signed)
[employer] called back and did again tell them the last a1c, date and also date of last Echo and ejection fraction

## 2017-09-03 ENCOUNTER — Other Ambulatory Visit: Payer: Self-pay | Admitting: Family Medicine

## 2017-09-03 DIAGNOSIS — IMO0002 Reserved for concepts with insufficient information to code with codable children: Secondary | ICD-10-CM

## 2017-09-03 DIAGNOSIS — E11319 Type 2 diabetes mellitus with unspecified diabetic retinopathy without macular edema: Secondary | ICD-10-CM

## 2017-09-03 DIAGNOSIS — E1165 Type 2 diabetes mellitus with hyperglycemia: Principal | ICD-10-CM

## 2017-09-11 ENCOUNTER — Other Ambulatory Visit: Payer: Self-pay | Admitting: Family Medicine

## 2017-09-15 ENCOUNTER — Telehealth: Payer: Self-pay | Admitting: *Deleted

## 2017-09-15 NOTE — Telephone Encounter (Signed)
Received Home Health Plan of Care; forwarded to provider/SLS 01/29

## 2017-09-27 ENCOUNTER — Other Ambulatory Visit: Payer: Self-pay | Admitting: Family Medicine

## 2017-09-27 DIAGNOSIS — E11319 Type 2 diabetes mellitus with unspecified diabetic retinopathy without macular edema: Secondary | ICD-10-CM

## 2017-09-29 ENCOUNTER — Other Ambulatory Visit: Payer: Self-pay | Admitting: Family Medicine

## 2017-09-29 DIAGNOSIS — E11319 Type 2 diabetes mellitus with unspecified diabetic retinopathy without macular edema: Secondary | ICD-10-CM

## 2017-09-30 ENCOUNTER — Ambulatory Visit: Payer: Medicare Other | Admitting: Family Medicine

## 2017-10-21 ENCOUNTER — Ambulatory Visit: Payer: Medicare Other | Admitting: Family Medicine

## 2017-10-27 ENCOUNTER — Other Ambulatory Visit: Payer: Self-pay | Admitting: Family Medicine

## 2017-10-27 DIAGNOSIS — E11319 Type 2 diabetes mellitus with unspecified diabetic retinopathy without macular edema: Secondary | ICD-10-CM

## 2017-11-05 ENCOUNTER — Encounter: Payer: Self-pay | Admitting: Family Medicine

## 2017-11-05 ENCOUNTER — Ambulatory Visit: Payer: Medicare Other | Admitting: Family Medicine

## 2017-11-05 VITALS — BP 132/62 | HR 68 | Temp 98.0°F | Ht 61.0 in | Wt 202.0 lb

## 2017-11-05 DIAGNOSIS — E785 Hyperlipidemia, unspecified: Secondary | ICD-10-CM | POA: Diagnosis not present

## 2017-11-05 DIAGNOSIS — I10 Essential (primary) hypertension: Secondary | ICD-10-CM

## 2017-11-05 DIAGNOSIS — E11319 Type 2 diabetes mellitus with unspecified diabetic retinopathy without macular edema: Secondary | ICD-10-CM | POA: Diagnosis not present

## 2017-11-05 LAB — CBC
HCT: 41.1 % (ref 36.0–46.0)
Hemoglobin: 13.1 g/dL (ref 12.0–15.0)
MCHC: 31.8 g/dL (ref 30.0–36.0)
MCV: 84.1 fl (ref 78.0–100.0)
PLATELETS: 301 10*3/uL (ref 150.0–400.0)
RBC: 4.89 Mil/uL (ref 3.87–5.11)
RDW: 19.2 % — AB (ref 11.5–15.5)
WBC: 7.7 10*3/uL (ref 4.0–10.5)

## 2017-11-05 LAB — MICROALBUMIN / CREATININE URINE RATIO
CREATININE, U: 10.1 mg/dL
Microalb Creat Ratio: 6.9 mg/g (ref 0.0–30.0)
Microalb, Ur: <0.7 mg/dL (ref 0.0–1.9)

## 2017-11-05 LAB — HEMOGLOBIN A1C: Hgb A1c MFr Bld: 8.2 % — ABNORMAL HIGH (ref 4.6–6.5)

## 2017-11-05 NOTE — Progress Notes (Signed)
Pre visit review using our clinic review tool, if applicable. No additional management support is needed unless otherwise documented below in the visit note. 

## 2017-11-05 NOTE — Patient Instructions (Signed)
Keep up the good work.   Give us 2-3 business days to get the results of your labs back. If labs are normal, you will likely receive a letter in the mail unless you have MyChart. This can take longer than 2-3 business days.

## 2017-11-05 NOTE — Progress Notes (Signed)
Subjective:   Chief Complaint  Patient presents with  . Follow-up    Elizabeth [name] is a 83 y.o. female here for follow-up of diabetes.   Marchel does not routinely ck her sugars. Patient does not require insulin.   Medications include: Invokana 100 mg/d, Metformin 500 mg XR/d Patient exercises several days per week on average.   Statin? Yes ACEi/ARB? Yes  Hypertension Patient presents for hypertension follow up. She does not routinely monitor home blood pressures. She is compliant with medications. Patient has these side effects of medication: none She is adhering to a healthy diet overall. Exercise: some walking  Hyperlipidemia Patient presents for dyslipidemia follow up. Currently being treated with Lipitor 40 mg/d and compliance with treatment thus far has been good. She denies myalgias. She is adhering to a healthy. The patient exercises intermittently.  The patient is known to have coexisting coronary artery disease.   Past Medical History:  Diagnosis Date  . Allergy   . CAD (coronary artery disease)   . Diabetes mellitus    type II  . Diabetic retinopathy    Dr Perley Jain @ [facility] (vitrectomy left eye)  . Gastroenteritis 02/13/2013  . History of GI bleed    secondary to heparin  . Hypertension   . LBP (low back pain)   . Muscle cramp 02/13/2013  . Right hip pain 02/13/2013  . Shingles   . Tubal pregnancy     Past Surgical History:  Procedure Laterality Date  . APPENDECTOMY    . CATARACT EXTRACTION    . EYE SURGERY    . LAMINOTOMY     Dr Jordan Likes L3-4  and microdiskectomy  . TONSILLECTOMY    . TUBAL LIGATION      Social History   Socioeconomic History  . Marital status: Married  Occupational History  . Occupation: Retired  Tobacco Use  . Smoking status: Never Smoker  . Smokeless tobacco: Never Used  Substance and Sexual Activity  . Alcohol use: No  . Drug use: No  . Sexual activity: Never   Current Outpatient Medications on File Prior to Visit   Medication Sig Dispense Refill  . acetaminophen (TYLENOL) 500 MG tablet Take 500 mg by mouth every 6 (six) hours as needed for headache (pain).    . ASPIRIN LOW DOSE 81 MG EC tablet TAKE 1 TABLET BY MOUTH DAILY 90 tablet 0  . atorvastatin (LIPITOR) 40 MG tablet TAKE 1 TABLET(40 MG) BY MOUTH DAILY 90 tablet 0  . clopidogrel (PLAVIX) 75 MG tablet TAKE 1 TABLET(75 MG) BY MOUTH EVERY MORNING 90 tablet 0  . furosemide (LASIX) 40 MG tablet Take 1 tablet (40 mg total) by mouth daily. 90 tablet 1  . glucose blood test strip Use as instructed to check blood sugar 3 times per day Diagnoses Code 250.00 100 each 12  . INVOKANA 100 MG TABS tablet TAKE 1 TABLET BY MOUTH EVERY MORNING WITH BREAKFAST 30 tablet 5  . Lancets (FREESTYLE) lancets Use as instructed to check blood sugar 3 times per day Diagnoses Code 250.00 300 each prn  . loperamide (IMODIUM A-D) 2 MG capsule Take 2 mg by mouth as needed for diarrhea or loose stools.    . metFORMIN (GLUCOPHAGE-XR) 500 MG 24 hr tablet TAKE 1 TABLET BY MOUTH EVERY DAY WITH BREAKFAST 30 tablet 0  . metoprolol tartrate (LOPRESSOR) 50 MG tablet TAKE 1/2 TABLET(25 MG) BY MOUTH TWICE DAILY 90 tablet 0  . nitroGLYCERIN (NITROSTAT) 0.4 MG SL tablet Place 1 tablet (0.4  mg total) under the tongue every 5 (five) minutes as needed for chest pain. 15 tablet 0  . Olopatadine HCl (PATADAY) 0.2 % SOLN Place 1 drop into both eyes daily.    Marland Kitchen. OVER THE COUNTER MEDICATION Apply 1 application topically 2 (two) times daily as needed (eczema). "Exerderm"      Related testing: Foot exam(monofilament and inspection): UTD Date of retinal exam: 04/2017  Done by:  Dr. Leonia CoronaGrevin Pneumovax: done Flu Shot: done  Review of Systems: Pulmonary:  No SOB Cardiovascular:  No chest pain, no palpitations  Objective:  BP 132/62 (BP Location: Left Arm, Patient Position: Sitting, Cuff Size: Large)   Pulse 68   Temp 98 F (36.7 C) (Oral)   Ht 5\' 1"  (1.549 m)   Wt 202 lb (91.6 kg)   SpO2 98%   BMI  38.17 kg/m  General:  Well developed, well nourished, in no apparent distress Skin:  Warm, no pallor or diaphoresis Head:  Normocephalic, atraumatic Eyes:  Pupils equal and round, sclera anicteric without injection  Nose:  External nares without trauma, no discharge Throat/Pharynx:  Lips and gingiva without lesion Neck: Neck supple.  No obvious thyromegaly or masses.  No bruits Lungs:  clear to auscultation, breath sounds equal bilaterally, no wheezes, rales, or stridor Cardio:  regular rate and rhythm without murmurs, no bruits, no LE edema Psych: Age appropriate judgment and insight  Assessment:   Controlled type 2 diabetes mellitus with retinopathy, without long-term current use of insulin, macular edema presence unspecified, unspecified laterality, unspecified retinopathy severity (HCC) - Plan: Comprehensive metabolic panel, CBC, Hemoglobin A1c, Lipid panel, Microalbumin / creatinine urine ratio  Essential hypertension  Dyslipidemia   Plan:   Orders as above. Ck labs Cont meds. Counseled on diet and exercise. F/u in 6 mo. The patient voiced understanding and agreement to the plan.  Jilda Rocheicholas Paul HarrisburgW[place], DO 11/05/17 10:25 AM

## 2017-11-06 LAB — LIPID PANEL
CHOLESTEROL: 102 mg/dL (ref 0–200)
HDL: 53.9 mg/dL (ref >39.00)
LDL CALC: 36 mg/dL (ref 0–99)
NonHDL: 47.69
TRIGLYCERIDES: 57 mg/dL (ref 0.0–149.0)
Total CHOL/HDL Ratio: 2
VLDL: 11.4 mg/dL (ref 0.0–40.0)

## 2017-11-06 LAB — COMPREHENSIVE METABOLIC PANEL
ALBUMIN: 3.7 g/dL (ref 3.5–5.2)
ALK PHOS: 69 U/L (ref 39–117)
ALT: 18 U/L (ref 0–35)
AST: 19 U/L (ref 0–37)
BILIRUBIN TOTAL: 0.5 mg/dL (ref 0.2–1.2)
BUN: 29 mg/dL — ABNORMAL HIGH (ref 6–23)
CO2: 27 mEq/L (ref 19–32)
Calcium: 9.3 mg/dL (ref 8.4–10.5)
Chloride: 102 mEq/L (ref 96–112)
Creatinine, Ser: 1.17 mg/dL (ref 0.40–1.20)
GFR: 47.24 mL/min — AB (ref >60.00)
GLUCOSE: 166 mg/dL — AB (ref 70–99)
Potassium: 5 mEq/L (ref 3.5–5.1)
Sodium: 141 mEq/L (ref 135–145)
TOTAL PROTEIN: 7 g/dL (ref 6.0–8.3)

## 2017-11-22 ENCOUNTER — Other Ambulatory Visit: Payer: Self-pay | Admitting: Family Medicine

## 2017-11-22 DIAGNOSIS — E11319 Type 2 diabetes mellitus with unspecified diabetic retinopathy without macular edema: Secondary | ICD-10-CM

## 2017-12-02 ENCOUNTER — Telehealth: Payer: Self-pay | Admitting: Family Medicine

## 2017-12-02 NOTE — Telephone Encounter (Signed)
hgba1c was 8.2 on 10/2017. Called [employer] ext. Left a detailed message of a1c result

## 2017-12-02 NOTE — Telephone Encounter (Signed)
Copied from CRM [phone]. Topic: Quick Communication - See Telephone Encounter >> Dec 02, 2017  3:29 PM Debroah LoopLander, Lumin L wrote: CRM for notification. See Telephone encounter for: 12/02/17. Ching with [employer] calling to get A1C level from 11/05/2017. P: [phone] ext [phone]

## 2017-12-16 NOTE — Progress Notes (Deleted)
Subjective:   Elizabeth [name] is a 83 y.o. female who presents for Medicare Annual (Subsequent) preventive examination.  Review of Systems: No ROS.  Medicare Wellness Visit. Additional risk factors are reflected in the social history.    Sleep patterns: no issues Home Safety/Smoke Alarms: Feels safe in home. Smoke alarms in place.  Living environment; residence and Firearm Safety: Lives with husband. Can live 1 level. Walk-in shower with bench.   Female:   Mammo- No longer doing routine screening due to age. Dexa scan-utd        CCS-No longer doing routine screening due to age.      Objective:     Vitals: There were no vitals taken for this visit.  There is no height or weight on file to calculate BMI.  Advanced Directives 06/01/2017 06/01/2017 12/18/2016 07/12/2016 07/09/2016  Does Patient Have a Medical Advance Directive? No No No No No  Would patient like information on creating a medical advance directive? No - Patient declined - No - Patient declined No - Patient declined No - Patient declined    Tobacco Social History   Tobacco Use  Smoking Status Never Smoker  Smokeless Tobacco Never Used     Counseling given: Not Answered   Clinical Intake:                       Past Medical History:  Diagnosis Date  . Allergy   . CAD (coronary artery disease)   . Diabetes mellitus    type II  . Diabetic retinopathy    Dr Perley Jain @ [facility] (vitrectomy left eye)  . Gastroenteritis 02/13/2013  . History of GI bleed    secondary to heparin  . Hypertension   . LBP (low back pain)   . Muscle cramp 02/13/2013  . Right hip pain 02/13/2013  . Shingles   . Tubal pregnancy    Past Surgical History:  Procedure Laterality Date  . APPENDECTOMY    . CATARACT EXTRACTION    . EYE SURGERY    . LAMINOTOMY     Dr Jordan Likes L3-4  and microdiskectomy  . TONSILLECTOMY    . TUBAL LIGATION     Family History  Problem Relation Age of Onset  . Stroke Father   . Coronary  artery disease Unknown   . Heart disease Mother    Social History   Socioeconomic History  . Marital status: Married    Spouse name: Not on file  . Number of children: 3  . Years of education: Not on file  . Highest education level: Not on file  Occupational History  . Occupation: Retired    Associate Professor: RETIRED  Social Needs  . Financial resource strain: Not on file  . Food insecurity:    Worry: Not on file    Inability: Not on file  . Transportation needs:    Medical: Not on file    Non-medical: Not on file  Tobacco Use  . Smoking status: Never Smoker  . Smokeless tobacco: Never Used  Substance and Sexual Activity  . Alcohol use: No  . Drug use: No  . Sexual activity: Never  Lifestyle  . Physical activity:    Days per week: Not on file    Minutes per session: Not on file  . Stress: Not on file  Relationships  . Social connections:    Talks on phone: Not on file    Gets together: Not on file    Attends  religious service: Not on file    Active member of club or organization: Not on file    Attends meetings of clubs or organizations: Not on file    Relationship status: Not on file  Other Topics Concern  . Not on file  Social History Narrative  . Not on file    Outpatient Encounter Medications as of 12/21/2017  Medication Sig  . acetaminophen (TYLENOL) 500 MG tablet Take 500 mg by mouth every 6 (six) hours as needed for headache (pain).  . ASPIRIN LOW DOSE 81 MG EC tablet TAKE 1 TABLET BY MOUTH DAILY  . atorvastatin (LIPITOR) 40 MG tablet TAKE 1 TABLET(40 MG) BY MOUTH DAILY  . clopidogrel (PLAVIX) 75 MG tablet TAKE 1 TABLET(75 MG) BY MOUTH EVERY MORNING  . furosemide (LASIX) 40 MG tablet Take 1 tablet (40 mg total) by mouth daily.  Marland Kitchen glucose blood test strip Use as instructed to check blood sugar 3 times per day Diagnoses Code 250.00  . INVOKANA 100 MG TABS tablet TAKE 1 TABLET BY MOUTH EVERY MORNING WITH BREAKFAST  . Lancets (FREESTYLE) lancets Use as instructed to  check blood sugar 3 times per day Diagnoses Code 250.00  . loperamide (IMODIUM A-D) 2 MG capsule Take 2 mg by mouth as needed for diarrhea or loose stools.  . metFORMIN (GLUCOPHAGE-XR) 500 MG 24 hr tablet TAKE 1 TABLET BY MOUTH EVERY DAY WITH BREAKFAST  . metoprolol tartrate (LOPRESSOR) 50 MG tablet TAKE 1/2 TABLET(25 MG) BY MOUTH TWICE DAILY  . nitroGLYCERIN (NITROSTAT) 0.4 MG SL tablet Place 1 tablet (0.4 mg total) under the tongue every 5 (five) minutes as needed for chest pain.  Marland Kitchen Olopatadine HCl (PATADAY) 0.2 % SOLN Place 1 drop into both eyes daily.  Marland Kitchen OVER THE COUNTER MEDICATION Apply 1 application topically 2 (two) times daily as needed (eczema). "Exerderm"   No facility-administered encounter medications on file as of 12/21/2017.     Activities of Daily Living In your present state of health, do you have any difficulty performing the following activities: 06/01/2017 12/18/2016  Hearing? N N  Vision? Y Y  Difficulty concentrating or making decisions? N N  Walking or climbing stairs? Y N  Dressing or bathing? N N  Doing errands, shopping? N Y  Comment - Patient does not drive.  Preparing Food and eating ? - N  Using the Toilet? - N  In the past six months, have you accidently leaked urine? - N  Do you have problems with loss of bowel control? - N  Managing your Medications? - N  Managing your Finances? - N  Housekeeping or managing your Housekeeping? - N  Some recent data might be hidden    Patient Care Team: Sharlene Dory, DO as PCP - General (Family Medicine) Marvis Repress, MD as Consulting Physician (Ophthalmology) Diamond Nickel., MD as Consulting Physician (Cardiology) Jackelyn Hoehn, MD as Consulting Physician (Dermatology) Rick Duff, DDS (Dentistry)    Assessment:   This is a routine wellness examination for Nylah. Physical assessment deferred to PCP.   Exercise Activities and Dietary recommendations   Diet (meal preparation, eat out, water intake,  caffeinated beverages, dairy products, fruits and vegetables): {Desc; diets:16563} Breakfast: Lunch:  Dinner:      Goals    None      Fall Risk Fall Risk  12/18/2016 08/06/2016 11/29/2014  Falls in the past year? No No No    Depression Screen PHQ 2/9 Scores 12/18/2016 08/06/2016 11/29/2014  PHQ - 2  Score 0 0 0     Cognitive Function MMSE - Mini Mental State Exam 12/18/2016  Orientation to time 5  Orientation to Place 5  Registration 3  Attention/ Calculation 5  Recall 3  Language- name 2 objects 2  Language- repeat 1  Language- follow 3 step command 3  Language- read & follow direction 1  Write a sentence 1  Copy design 1  Total score 30        Immunization History  Administered Date(s) Administered  . Influenza Split 06/24/2011, 06/22/2012  . Influenza Whole 09/20/2009, 04/15/2010  . Influenza, High Dose Seasonal PF 06/06/2013, 08/06/2016, 05/27/2017  . Pneumococcal Conjugate-13 12/04/2016  . Pneumococcal Polysaccharide-23 09/22/2006  . Td 03/02/2006  . Tdap 12/04/2016    Screening Tests Health Maintenance  Topic Date Due  . FOOT EXAM  12/04/2017  . INFLUENZA VACCINE  03/18/2018  . OPHTHALMOLOGY EXAM  04/18/2018  . HEMOGLOBIN A1C  05/08/2018  . URINE MICROALBUMIN  11/06/2018  . TETANUS/TDAP  12/05/2026  . DEXA SCAN  Completed  . PNA vac Low Risk Adult  Completed   Plan:   ***   I have personally reviewed and noted the following in the patient's chart:   . Medical and social history . Use of alcohol, tobacco or illicit drugs  . Current medications and supplements . Functional ability and status . Nutritional status . Physical activity . Advanced directives . List of other physicians . Hospitalizations, surgeries, and ER visits in previous 12 months . Vitals . Screenings to include cognitive, depression, and falls . Referrals and appointments  In addition, I have reviewed and discussed with patient certain preventive protocols, quality metrics,  and best practice recommendations. A written personalized care plan for preventive services as well as general preventive health recommendations were provided to patient.     Avon Gully, [place]  12/16/2017

## 2017-12-18 ENCOUNTER — Other Ambulatory Visit: Payer: Self-pay | Admitting: Family Medicine

## 2017-12-21 ENCOUNTER — Ambulatory Visit: Payer: Medicare Other | Admitting: *Deleted

## 2017-12-23 IMAGING — DX DG CHEST 2V
2 series · 2 of 2 positions shown · non-contrast
Comparison: 07/09/2016.

CLINICAL DATA: Shortness of breath for 3 weeks.

EXAM:
CHEST  2 VIEW

[chest pa]
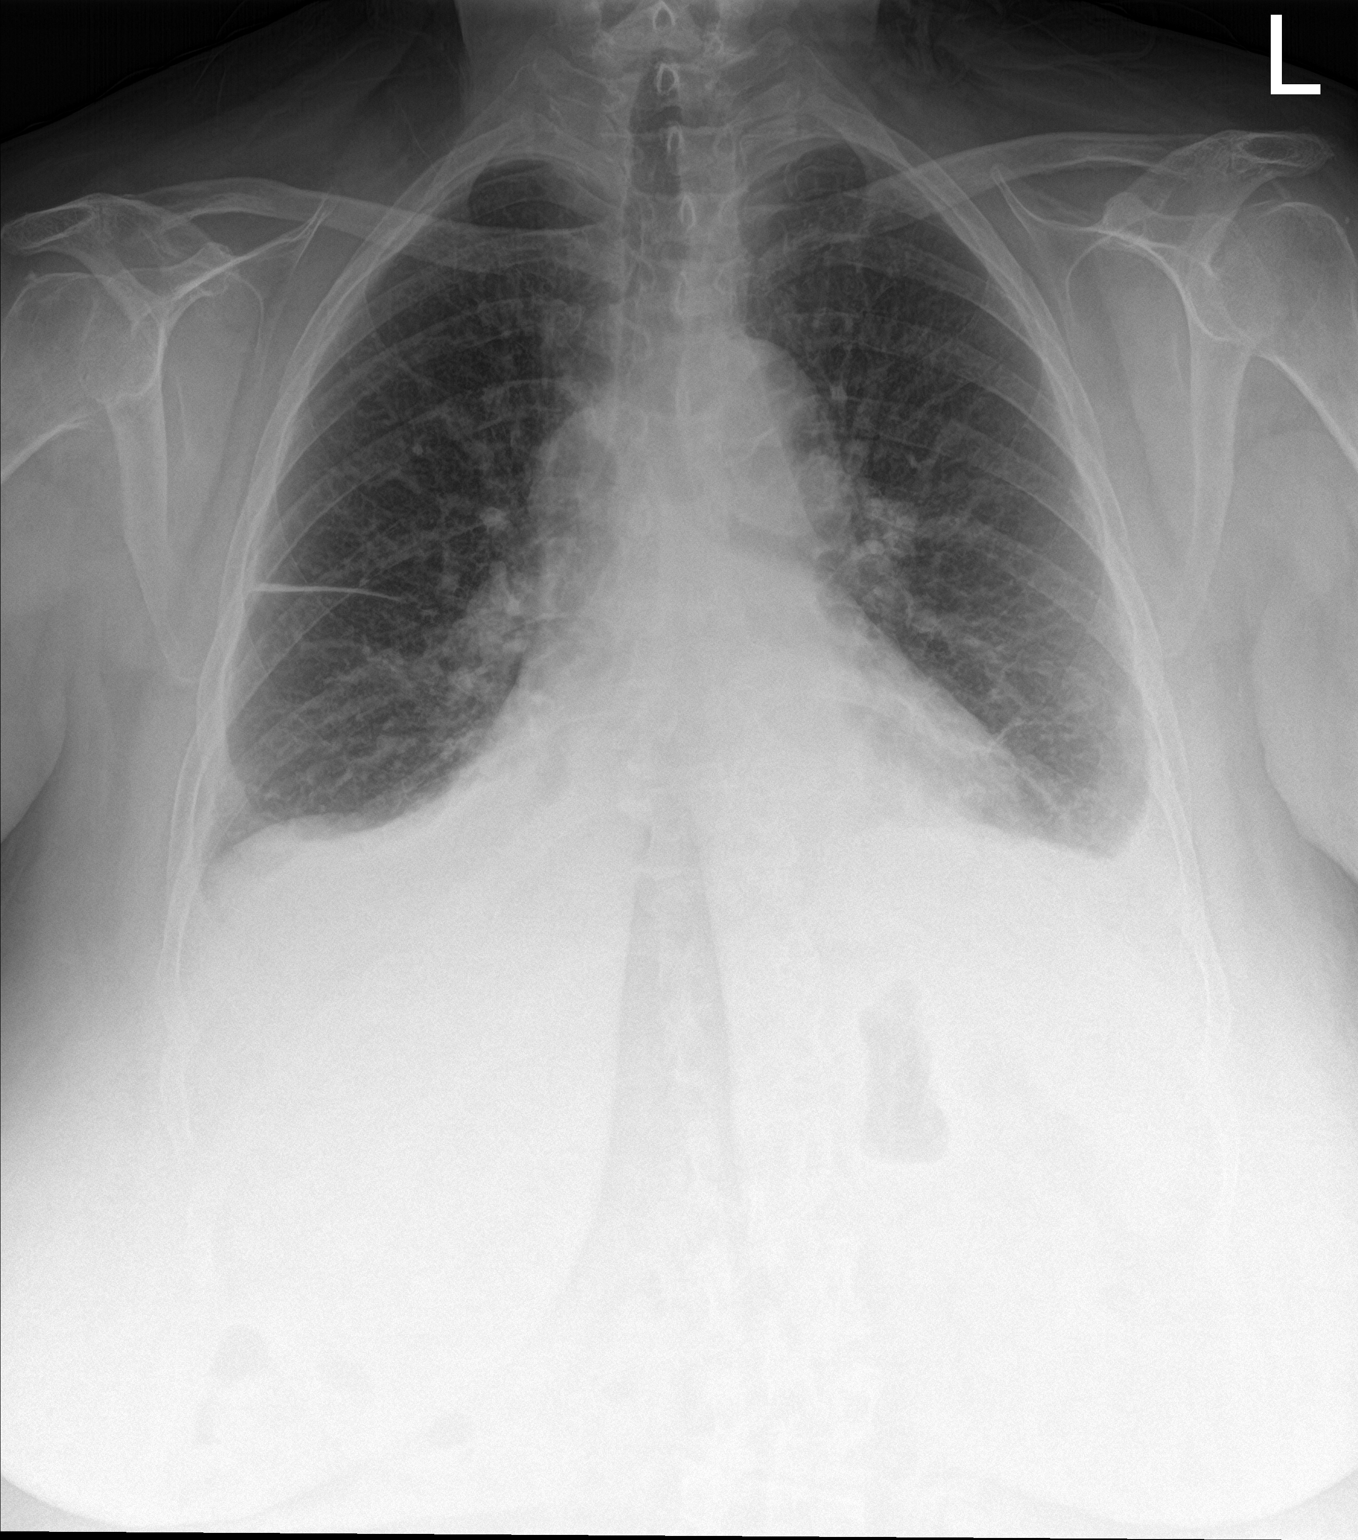

[chest lat]
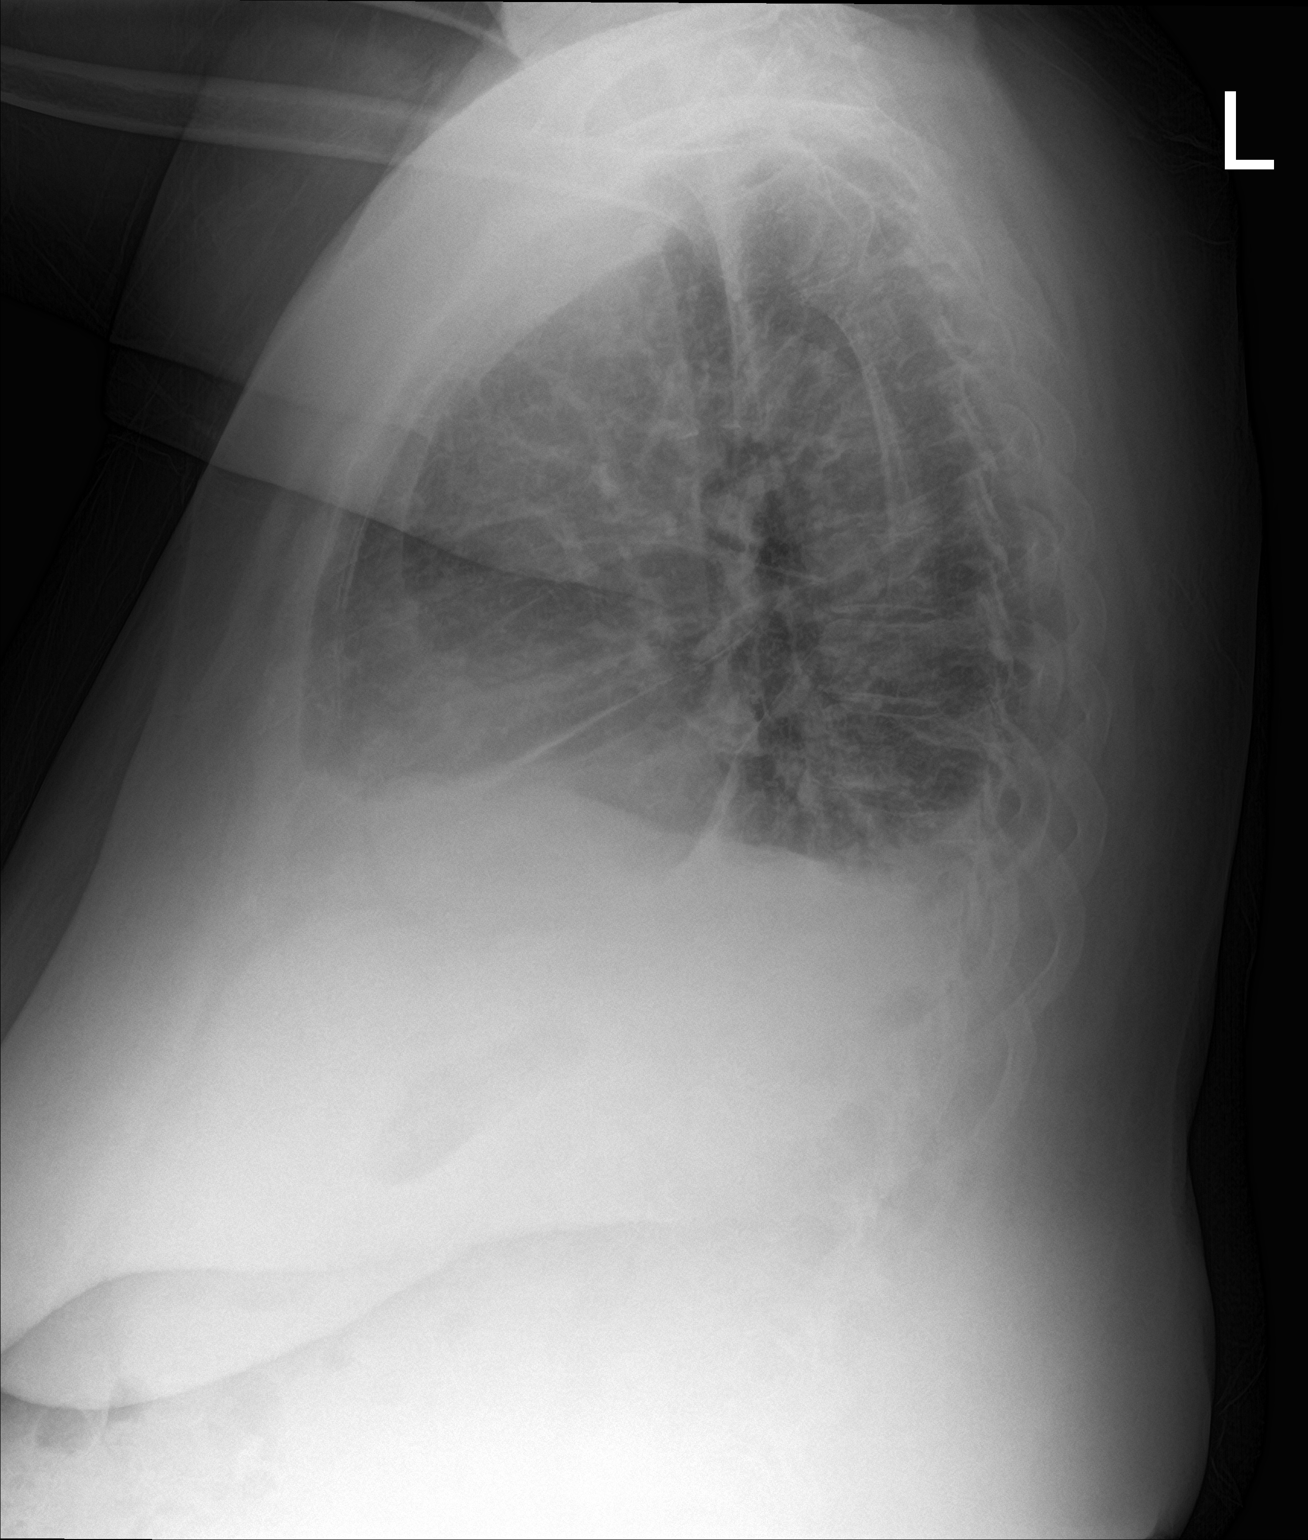

[2 of 2 positions shown; findings below may reference images not displayed]

FINDINGS: The heart is enlarged. BILATERAL pleural effusions are present.
There is mild edema. No consolidation. No osseous findings.
Worsening aeration compared with priors.
IMPRESSION: Cardiomegaly. Early pulmonary edema with BILATERAL pleural
effusions. Worsening aeration.

## 2018-01-03 ENCOUNTER — Other Ambulatory Visit: Payer: Self-pay | Admitting: Family Medicine

## 2018-01-03 DIAGNOSIS — E11319 Type 2 diabetes mellitus with unspecified diabetic retinopathy without macular edema: Secondary | ICD-10-CM

## 2018-01-27 NOTE — Progress Notes (Deleted)
Subjective:   Elizabeth [name] is a 83 y.o. female who presents for Medicare Annual (Subsequent) preventive examination.  Review of Systems: No ROS.  Medicare Wellness Visit. Additional risk factors are reflected in the social history.   Sleep patterns: Home Safety/Smoke Alarms: Feels safe in home. Smoke alarms in place.  Living environment; residence and Firearm Safety:    Female:         Mammo-       Dexa scan-          Objective:     Vitals: There were no vitals taken for this visit.  There is no height or weight on file to calculate BMI.  Advanced Directives 06/01/2017 06/01/2017 12/18/2016 07/12/2016 07/09/2016  Does Patient Have a Medical Advance Directive? No No No No No  Would patient like information on creating a medical advance directive? No - Patient declined - No - Patient declined No - Patient declined No - Patient declined    Tobacco Social History   Tobacco Use  Smoking Status Never Smoker  Smokeless Tobacco Never Used     Counseling given: Not Answered   Clinical Intake:                       Past Medical History:  Diagnosis Date  . Allergy   . CAD (coronary artery disease)   . Diabetes mellitus    type II  . Diabetic retinopathy    Dr Perley Jain @ [facility] (vitrectomy left eye)  . Gastroenteritis 02/13/2013  . History of GI bleed    secondary to heparin  . Hypertension   . LBP (low back pain)   . Muscle cramp 02/13/2013  . Right hip pain 02/13/2013  . Shingles   . Tubal pregnancy    Past Surgical History:  Procedure Laterality Date  . APPENDECTOMY    . CATARACT EXTRACTION    . EYE SURGERY    . LAMINOTOMY     Dr Jordan Likes L3-4  and microdiskectomy  . TONSILLECTOMY    . TUBAL LIGATION     Family History  Problem Relation Age of Onset  . Stroke Father   . Coronary artery disease Unknown   . Heart disease Mother    Social History   Socioeconomic History  . Marital status: Married    Spouse name: Not on file  . Number of  children: 3  . Years of education: Not on file  . Highest education level: Not on file  Occupational History  . Occupation: Retired    Associate Professor: RETIRED  Social Needs  . Financial resource strain: Not on file  . Food insecurity:    Worry: Not on file    Inability: Not on file  . Transportation needs:    Medical: Not on file    Non-medical: Not on file  Tobacco Use  . Smoking status: Never Smoker  . Smokeless tobacco: Never Used  Substance and Sexual Activity  . Alcohol use: No  . Drug use: No  . Sexual activity: Never  Lifestyle  . Physical activity:    Days per week: Not on file    Minutes per session: Not on file  . Stress: Not on file  Relationships  . Social connections:    Talks on phone: Not on file    Gets together: Not on file    Attends religious service: Not on file    Active member of club or organization: Not on file  Attends meetings of clubs or organizations: Not on file    Relationship status: Not on file  Other Topics Concern  . Not on file  Social History Narrative  . Not on file    Outpatient Encounter Medications as of 02/01/2018  Medication Sig  . acetaminophen (TYLENOL) 500 MG tablet Take 500 mg by mouth every 6 (six) hours as needed for headache (pain).  . ASPIRIN LOW DOSE 81 MG EC tablet TAKE 1 TABLET BY MOUTH DAILY  . atorvastatin (LIPITOR) 40 MG tablet TAKE 1 TABLET(40 MG) BY MOUTH DAILY  . clopidogrel (PLAVIX) 75 MG tablet TAKE 1 TABLET(75 MG) BY MOUTH EVERY MORNING  . furosemide (LASIX) 40 MG tablet Take 1 tablet (40 mg total) by mouth daily.  Marland Kitchen glucose blood test strip Use as instructed to check blood sugar 3 times per day Diagnoses Code 250.00  . INVOKANA 100 MG TABS tablet TAKE 1 TABLET BY MOUTH EVERY MORNING WITH BREAKFAST  . Lancets (FREESTYLE) lancets Use as instructed to check blood sugar 3 times per day Diagnoses Code 250.00  . loperamide (IMODIUM A-D) 2 MG capsule Take 2 mg by mouth as needed for diarrhea or loose stools.  .  metFORMIN (GLUCOPHAGE-XR) 500 MG 24 hr tablet TAKE 1 TABLET BY MOUTH EVERY DAY WITH BREAKFAST  . metoprolol tartrate (LOPRESSOR) 50 MG tablet TAKE 1/2 TABLET(25 MG) BY MOUTH TWICE DAILY  . nitroGLYCERIN (NITROSTAT) 0.4 MG SL tablet Place 1 tablet (0.4 mg total) under the tongue every 5 (five) minutes as needed for chest pain.  Marland Kitchen Olopatadine HCl (PATADAY) 0.2 % SOLN Place 1 drop into both eyes daily.  Marland Kitchen OVER THE COUNTER MEDICATION Apply 1 application topically 2 (two) times daily as needed (eczema). "Exerderm"   No facility-administered encounter medications on file as of 02/01/2018.     Activities of Daily Living In your present state of health, do you have any difficulty performing the following activities: 06/01/2017  Hearing? N  Vision? Y  Difficulty concentrating or making decisions? N  Walking or climbing stairs? Y  Dressing or bathing? N  Doing errands, shopping? N  Some recent data might be hidden    Patient Care Team: Sharlene Dory, DO as PCP - General (Family Medicine) Marvis Repress, MD as Consulting Physician (Ophthalmology) Diamond Nickel., MD as Consulting Physician (Cardiology) Jackelyn Hoehn, MD as Consulting Physician (Dermatology) Rick Duff, DDS (Dentistry)    Assessment:   This is a routine wellness examination for Elizabeth [name]. Physical assessment deferred to PCP.  Exercise Activities and Dietary recommendations   Diet (meal preparation, eat out, water intake, caffeinated beverages, dairy products, fruits and vegetables): {Desc; diets:16563} Breakfast: Lunch:  Dinner:      Goals    None      Fall Risk Fall Risk  12/18/2016 08/06/2016 11/29/2014  Falls in the past year? No No No    Depression Screen PHQ 2/9 Scores 12/18/2016 08/06/2016 11/29/2014  PHQ - 2 Score 0 0 0     Cognitive Function MMSE - Mini Mental State Exam 12/18/2016  Orientation to time 5  Orientation to Place 5  Registration 3  Attention/ Calculation 5  Recall 3  Language- name  2 objects 2  Language- repeat 1  Language- follow 3 step command 3  Language- read & follow direction 1  Write a sentence 1  Copy design 1  Total score 30        Immunization History  Administered Date(s) Administered  . Influenza Split 06/24/2011, 06/22/2012  .  Influenza Whole 09/20/2009, 04/15/2010  . Influenza, High Dose Seasonal PF 06/06/2013, 08/06/2016, 05/27/2017  . Pneumococcal Conjugate-13 12/04/2016  . Pneumococcal Polysaccharide-23 09/22/2006  . Td 03/02/2006  . Tdap 12/04/2016   Screening Tests Health Maintenance  Topic Date Due  . FOOT EXAM  12/04/2017  . INFLUENZA VACCINE  03/18/2018  . OPHTHALMOLOGY EXAM  04/18/2018  . HEMOGLOBIN A1C  05/08/2018  . URINE MICROALBUMIN  11/06/2018  . TETANUS/TDAP  12/05/2026  . DEXA SCAN  Completed  . PNA vac Low Risk Adult  Completed        Plan:   ***   I have personally reviewed and noted the following in the patient's chart:   . Medical and social history . Use of alcohol, tobacco or illicit drugs  . Current medications and supplements . Functional ability and status . Nutritional status . Physical activity . Advanced directives . List of other physicians . Hospitalizations, surgeries, and ER visits in previous 12 months . Vitals . Screenings to include cognitive, depression, and falls . Referrals and appointments  In addition, I have reviewed and discussed with patient certain preventive protocols, quality metrics, and best practice recommendations. A written personalized care plan for preventive services as well as general preventive health recommendations were provided to patient.     Avon GullyBritt, [place] Fini [place], [place]  01/27/2018

## 2018-02-01 ENCOUNTER — Ambulatory Visit: Payer: Medicare Other | Admitting: *Deleted

## 2018-02-07 ENCOUNTER — Other Ambulatory Visit: Payer: Self-pay | Admitting: Family Medicine

## 2018-02-07 DIAGNOSIS — E11319 Type 2 diabetes mellitus with unspecified diabetic retinopathy without macular edema: Secondary | ICD-10-CM

## 2018-02-08 ENCOUNTER — Ambulatory Visit: Payer: Medicare Other | Admitting: Family Medicine

## 2018-03-03 NOTE — Progress Notes (Addendum)
Subjective:   Elizabeth [name] is a 83 y.o. female who presents for Medicare Annual (Subsequent) preventive examination.  Pt presents today very tearful. States she received call directly prior to this appt from oncology notifying her that her husband's cancer has spread.  Review of Systems: No ROS.  Medicare Wellness Visit. Additional risk factors are reflected in the social history. Cardiac Risk Factors include: advanced age (>33men, >28 women);hypertension;diabetes mellitus;obesity (BMI >30kg/m2) Sleep patterns: Husband is very sick so she has not been sleeping well. Home Safety/Smoke Alarms: Feels safe in home. Smoke alarms in place.  Living environment; residence and Firearm Safety: Lives in 2 story home. Lives with husband. Dtr lives 3 houses away.   Female:      Mammo-declines       Dexa scan-utd        CCS-declines  Eye- Dr.Grevin every 9 months    Objective:     Vitals: BP (!) 155/76 (BP Location: Left Wrist, Patient Position: Sitting, Cuff Size: Normal)   Pulse 73   Ht 5\' 1"  (1.549 m)   Wt 212 lb 9.6 oz (96.4 kg)   SpO2 98%   BMI 40.17 kg/m   Body mass index is 40.17 kg/m.  Advanced Directives 03/11/2018 06/01/2017 06/01/2017 12/18/2016 07/12/2016 07/09/2016  Does Patient Have a Medical Advance Directive? No No No No No No  Would patient like information on creating a medical advance directive? Yes (MAU/Ambulatory/Procedural Areas - Information given) No - Patient declined - No - Patient declined No - Patient declined No - Patient declined    Tobacco Social History   Tobacco Use  Smoking Status Never Smoker  Smokeless Tobacco Never Used     Counseling given: Not Answered   Clinical Intake: Pain : No/denies pain     Past Medical History:  Diagnosis Date  . Allergy   . CAD (coronary artery disease)   . Diabetes mellitus    type II  . Diabetic retinopathy    Dr Perley Jain @ [facility] (vitrectomy left eye)  . Gastroenteritis 02/13/2013  . History of GI  bleed    secondary to heparin  . Hypertension   . LBP (low back pain)   . Muscle cramp 02/13/2013  . Right hip pain 02/13/2013  . Shingles   . Tubal pregnancy    Past Surgical History:  Procedure Laterality Date  . APPENDECTOMY    . CATARACT EXTRACTION    . EYE SURGERY    . LAMINOTOMY     Dr Jordan Likes L3-4  and microdiskectomy  . TONSILLECTOMY    . TUBAL LIGATION     Family History  Problem Relation Age of Onset  . Stroke Father   . Coronary artery disease Unknown   . Heart disease Mother    Social History   Socioeconomic History  . Marital status: Married    Spouse name: Not on file  . Number of children: 3  . Years of education: Not on file  . Highest education level: Not on file  Occupational History  . Occupation: Retired    Associate Professor: RETIRED  Social Needs  . Financial resource strain: Not on file  . Food insecurity:    Worry: Not on file    Inability: Not on file  . Transportation needs:    Medical: Not on file    Non-medical: Not on file  Tobacco Use  . Smoking status: Never Smoker  . Smokeless tobacco: Never Used  Substance and Sexual Activity  . Alcohol use: No  .  Drug use: No  . Sexual activity: Never  Lifestyle  . Physical activity:    Days per week: Not on file    Minutes per session: Not on file  . Stress: Not on file  Relationships  . Social connections:    Talks on phone: Not on file    Gets together: Not on file    Attends religious service: Not on file    Active member of club or organization: Not on file    Attends meetings of clubs or organizations: Not on file    Relationship status: Not on file  Other Topics Concern  . Not on file  Social History Narrative  . Not on file    Outpatient Encounter Medications as of 03/11/2018  Medication Sig  . acetaminophen (TYLENOL) 500 MG tablet Take 500 mg by mouth every 6 (six) hours as needed for headache (pain).  . ASPIRIN LOW DOSE 81 MG EC tablet TAKE 1 TABLET BY MOUTH DAILY  . atorvastatin  (LIPITOR) 40 MG tablet TAKE 1 TABLET(40 MG) BY MOUTH DAILY  . clopidogrel (PLAVIX) 75 MG tablet TAKE 1 TABLET(75 MG) BY MOUTH EVERY MORNING  . furosemide (LASIX) 40 MG tablet TAKE 1 TABLET(40 MG) BY MOUTH DAILY  . glucose blood test strip Use as instructed to check blood sugar 3 times per day Diagnoses Code 250.00  . INVOKANA 100 MG TABS tablet TAKE 1 TABLET BY MOUTH EVERY MORNING WITH BREAKFAST  . Lancets (FREESTYLE) lancets Use as instructed to check blood sugar 3 times per day Diagnoses Code 250.00  . loperamide (IMODIUM A-D) 2 MG capsule Take 2 mg by mouth as needed for diarrhea or loose stools.  . metFORMIN (GLUCOPHAGE-XR) 500 MG 24 hr tablet TAKE 1 TABLET BY MOUTH EVERY DAY WITH BREAKFAST  . metoprolol tartrate (LOPRESSOR) 50 MG tablet TAKE 1/2 TABLET(25 MG) BY MOUTH TWICE DAILY  . nitroGLYCERIN (NITROSTAT) 0.4 MG SL tablet Place 1 tablet (0.4 mg total) under the tongue every 5 (five) minutes as needed for chest pain.  Marland Kitchen. Olopatadine HCl (PATADAY) 0.2 % SOLN Place 1 drop into both eyes daily.  Marland Kitchen. OVER THE COUNTER MEDICATION Apply 1 application topically 2 (two) times daily as needed (eczema). "Exerderm"  . [DISCONTINUED] furosemide (LASIX) 40 MG tablet Take 1 tablet (40 mg total) by mouth daily.  . [DISCONTINUED] INVOKANA 100 MG TABS tablet TAKE 1 TABLET BY MOUTH EVERY MORNING WITH BREAKFAST  . [DISCONTINUED] metFORMIN (GLUCOPHAGE-XR) 500 MG 24 hr tablet TAKE 1 TABLET BY MOUTH EVERY DAY WITH BREAKFAST   No facility-administered encounter medications on file as of 03/11/2018.     Activities of Daily Living In your present state of health, do you have any difficulty performing the following activities: 03/11/2018 06/01/2017  Hearing? N N  Vision? N Y  Difficulty concentrating or making decisions? N N  Walking or climbing stairs? N Y  Dressing or bathing? N N  Doing errands, shopping? N N  Preparing Food and eating ? N -  Using the Toilet? N -  In the past six months, have you accidently  leaked urine? N -  Do you have problems with loss of bowel control? N -  Managing your Medications? N -  Managing your Finances? N -  Housekeeping or managing your Housekeeping? N -  Some recent data might be hidden    Patient Care Team: Sharlene DoryWendling, Nicholas Paul, DO as PCP - General (Family Medicine) Marvis RepressGreven, Craig, MD as Consulting Physician (Ophthalmology) Diamond NickelFolk, Thomas G., MD as Consulting  Physician (Cardiology) Jackelyn Hoehn, MD as Consulting Physician (Dermatology) Rick Duff, DDS (Dentistry)    Assessment:   This is a routine wellness examination for Aaliayah. Physical assessment deferred to PCP.   Exercise Activities and Dietary recommendations Current Exercise Habits: Home exercise routine, Type of exercise: walking, Time (Minutes): 10 Diet (meal preparation, eat out, water intake, caffeinated beverages, dairy products, fruits and vegetables): well balanced     Goals    . Maintain current health       Fall Risk Fall Risk  03/11/2018 12/18/2016 08/06/2016 11/29/2014  Falls in the past year? No No No No    Depression Screen PHQ 2/9 Scores 03/11/2018 12/18/2016 08/06/2016 11/29/2014  PHQ - 2 Score 0 0 0 0     Cognitive Function Ad8 score reviewed for issues:  Issues making decisions:no  Less interest in hobbies / activities:no  Repeats questions, stories (family complaining):no  Trouble using ordinary gadgets (microwave, computer, phone):no  Forgets the month or year: no  Mismanaging finances: no  Remembering appts:no Daily problems with thinking and/or memory:no Ad8 score is=0     MMSE - Mini Mental State Exam 12/18/2016  Orientation to time 5  Orientation to Place 5  Registration 3  Attention/ Calculation 5  Recall 3  Language- name 2 objects 2  Language- repeat 1  Language- follow 3 step command 3  Language- read & follow direction 1  Write a sentence 1  Copy design 1  Total score 30        Immunization History  Administered Date(s)  Administered  . Influenza Split 06/24/2011, 06/22/2012  . Influenza Whole 09/20/2009, 04/15/2010  . Influenza, High Dose Seasonal PF 06/06/2013, 08/06/2016, 05/27/2017  . Pneumococcal Conjugate-13 12/04/2016  . Pneumococcal Polysaccharide-23 09/22/2006  . Td 03/02/2006  . Tdap 12/04/2016    Screening Tests Health Maintenance  Topic Date Due  . FOOT EXAM  12/04/2017  . INFLUENZA VACCINE  03/18/2018  . OPHTHALMOLOGY EXAM  04/18/2018  . HEMOGLOBIN A1C  05/08/2018  . URINE MICROALBUMIN  11/06/2018  . TETANUS/TDAP  12/05/2026  . DEXA SCAN  Completed  . PNA vac Low Risk Adult  Completed    Plan:    Please schedule your next medicare wellness visit with me in 1 yr.  Please let us know if you need anything.  Continue to eat heart healthy diet (full of fruits, vegetables, whole grains, lean protein, water--limit salt, fat, and sugar intake) and increase physical activity as tolerated.  Continue doing brain stimulating activities (puzzles, reading, adult coloring books, staying active) to keep memory sharp.     I have personally reviewed and noted the following in the patient's chart:   . Medical and social history . Use of alcohol, tobacco or illicit drugs  . Current medications and supplements . Functional ability and status . Nutritional status . Physical activity . Advanced directives . List of other physicians . Hospitalizations, surgeries, and ER visits in previous 12 months . Vitals . Screenings to include cognitive, depression, and falls . Referrals and appointments  In addition, I have reviewed and discussed with patient certain preventive protocols, quality metrics, and best practice recommendations. A written personalized care plan for preventive services as well as general preventive health recommendations were provided to patient.     Avon Gully, [place]  03/11/2018    Reviewed and agreed with assessment & plan of RN.  Esperanza Richters, PA-C

## 2018-03-07 ENCOUNTER — Other Ambulatory Visit: Payer: Self-pay | Admitting: Family Medicine

## 2018-03-07 DIAGNOSIS — IMO0002 Reserved for concepts with insufficient information to code with codable children: Secondary | ICD-10-CM

## 2018-03-07 DIAGNOSIS — E11319 Type 2 diabetes mellitus with unspecified diabetic retinopathy without macular edema: Secondary | ICD-10-CM

## 2018-03-07 DIAGNOSIS — I5022 Chronic systolic (congestive) heart failure: Secondary | ICD-10-CM

## 2018-03-07 DIAGNOSIS — E1165 Type 2 diabetes mellitus with hyperglycemia: Secondary | ICD-10-CM

## 2018-03-11 ENCOUNTER — Ambulatory Visit (INDEPENDENT_AMBULATORY_CARE_PROVIDER_SITE_OTHER): Payer: Medicare Other | Admitting: *Deleted

## 2018-03-11 ENCOUNTER — Encounter: Payer: Self-pay | Admitting: *Deleted

## 2018-03-11 VITALS — BP 155/76 | HR 73 | Ht 61.0 in | Wt 212.6 lb

## 2018-03-11 DIAGNOSIS — Z0000 Encounter for general adult medical examination without abnormal findings: Secondary | ICD-10-CM

## 2018-03-11 NOTE — Patient Instructions (Signed)
Please schedule your next medicare wellness visit with me in 1 yr.  Please let us know if you need anything.  Continue to eat heart healthy diet (full of fruits, vegetables, whole grains, lean protein, water--limit salt, fat, and sugar intake) and increase physical activity as tolerated.  Continue doing brain stimulating activities (puzzles, reading, adult coloring books, staying active) to keep memory sharp.   Elizabeth [name] , Thank you for taking time to come for your Medicare Wellness Visit. I appreciate your ongoing commitment to your health goals. Please review the following plan we discussed and let me know if I can assist you in the future.   These are the goals we discussed: Goals    . Maintain current health       This is a list of the screening recommended for you and due dates:  Health Maintenance  Topic Date Due  . Complete foot exam   12/04/2017  . Flu Shot  03/18/2018  . Eye exam for diabetics  04/18/2018  . Hemoglobin A1C  05/08/2018  . Urine Protein Check  11/06/2018  . Tetanus Vaccine  12/05/2026  . DEXA scan (bone density measurement)  Completed  . Pneumonia vaccines  Completed    Health Maintenance for Postmenopausal Women Menopause is a normal process in which your reproductive ability comes to an end. This process happens gradually over a span of months to years, usually between the ages of 34 and 19. Menopause is complete when you have missed 12 consecutive menstrual periods. It is important to talk with your health care provider about some of the most common conditions that affect postmenopausal women, such as heart disease, cancer, and bone loss (osteoporosis). Adopting a healthy lifestyle and getting preventive care can help to promote your health and wellness. Those actions can also lower your chances of developing some of these common conditions. What should I know about menopause? During menopause, you may experience a number of symptoms, such  as:  Moderate-to-severe hot flashes.  Night sweats.  Decrease in sex drive.  Mood swings.  Headaches.  Tiredness.  Irritability.  Memory problems.  Insomnia.  Choosing to treat or not to treat menopausal changes is an individual decision that you make with your health care provider. What should I know about hormone replacement therapy and supplements? Hormone therapy products are effective for treating symptoms that are associated with menopause, such as hot flashes and night sweats. Hormone replacement carries certain risks, especially as you become older. If you are thinking about using estrogen or estrogen with progestin treatments, discuss the benefits and risks with your health care provider. What should I know about heart disease and stroke? Heart disease, heart attack, and stroke become more likely as you age. This may be due, in part, to the hormonal changes that your body experiences during menopause. These can affect how your body processes dietary fats, triglycerides, and cholesterol. Heart attack and stroke are both medical emergencies. There are many things that you can do to help prevent heart disease and stroke:  Have your blood pressure checked at least every 1-2 years. High blood pressure causes heart disease and increases the risk of stroke.  If you are 95-52 years old, ask your health care provider if you should take aspirin to prevent a heart attack or a stroke.  Do not use any tobacco products, including cigarettes, chewing tobacco, or electronic cigarettes. If you need help quitting, ask your health care provider.  It is important to eat a healthy  diet and maintain a healthy weight. ? Be sure to include plenty of vegetables, fruits, low-fat dairy products, and lean protein. ? Avoid eating foods that are high in solid fats, added sugars, or salt (sodium).  Get regular exercise. This is one of the most important things that you can do for your health. ? Try  to exercise for at least 150 minutes each week. The type of exercise that you do should increase your heart rate and make you sweat. This is known as moderate-intensity exercise. ? Try to do strengthening exercises at least twice each week. Do these in addition to the moderate-intensity exercise.  Know your numbers.Ask your health care provider to check your cholesterol and your blood glucose. Continue to have your blood tested as directed by your health care provider.  What should I know about cancer screening? There are several types of cancer. Take the following steps to reduce your risk and to catch any cancer development as early as possible. Breast Cancer  Practice breast self-awareness. ? This means understanding how your breasts normally appear and feel. ? It also means doing regular breast self-exams. Let your health care provider know about any changes, no matter how small.  If you are 84 or older, have a clinician do a breast exam (clinical breast exam or CBE) every year. Depending on your age, family history, and medical history, it may be recommended that you also have a yearly breast X-ray (mammogram).  If you have a family history of breast cancer, talk with your health care provider about genetic screening.  If you are at high risk for breast cancer, talk with your health care provider about having an MRI and a mammogram every year.  Breast cancer (BRCA) gene test is recommended for women who have family members with BRCA-related cancers. Results of the assessment will determine the need for genetic counseling and BRCA1 and for BRCA2 testing. BRCA-related cancers include these types: ? Breast. This occurs in males or females. ? Ovarian. ? Tubal. This may also be called fallopian tube cancer. ? Cancer of the abdominal or pelvic lining (peritoneal cancer). ? Prostate. ? Pancreatic.  Cervical, Uterine, and Ovarian Cancer Your health care provider may recommend that you be  screened regularly for cancer of the pelvic organs. These include your ovaries, uterus, and vagina. This screening involves a pelvic exam, which includes checking for microscopic changes to the surface of your cervix (Pap test).  For women ages 21-65, health care providers may recommend a pelvic exam and a Pap test every three years. For women ages 25-65, they may recommend the Pap test and pelvic exam, combined with testing for human papilloma virus (HPV), every five years. Some types of HPV increase your risk of cervical cancer. Testing for HPV may also be done on women of any age who have unclear Pap test results.  Other health care providers may not recommend any screening for nonpregnant women who are considered low risk for pelvic cancer and have no symptoms. Ask your health care provider if a screening pelvic exam is right for you.  If you have had past treatment for cervical cancer or a condition that could lead to cancer, you need Pap tests and screening for cancer for at least 20 years after your treatment. If Pap tests have been discontinued for you, your risk factors (such as having a new sexual partner) need to be reassessed to determine if you should start having screenings again. Some women have medical problems that  increase the chance of getting cervical cancer. In these cases, your health care provider may recommend that you have screening and Pap tests more often.  If you have a family history of uterine cancer or ovarian cancer, talk with your health care provider about genetic screening.  If you have vaginal bleeding after reaching menopause, tell your health care provider.  There are currently no reliable tests available to screen for ovarian cancer.  Lung Cancer Lung cancer screening is recommended for adults 36-21 years old who are at high risk for lung cancer because of a history of smoking. A yearly low-dose CT scan of the lungs is recommended if you:  Currently  smoke.  Have a history of at least 30 pack-years of smoking and you currently smoke or have quit within the past 15 years. A pack-year is smoking an average of one pack of cigarettes per day for one year.  Yearly screening should:  Continue until it has been 15 years since you quit.  Stop if you develop a health problem that would prevent you from having lung cancer treatment.  Colorectal Cancer  This type of cancer can be detected and can often be prevented.  Routine colorectal cancer screening usually begins at age 71 and continues through age 54.  If you have risk factors for colon cancer, your health care provider may recommend that you be screened at an earlier age.  If you have a family history of colorectal cancer, talk with your health care provider about genetic screening.  Your health care provider may also recommend using home test kits to check for hidden blood in your stool.  A small camera at the end of a tube can be used to examine your colon directly (sigmoidoscopy or colonoscopy). This is done to check for the earliest forms of colorectal cancer.  Direct examination of the colon should be repeated every 5-10 years until age 25. However, if early forms of precancerous polyps or small growths are found or if you have a family history or genetic risk for colorectal cancer, you may need to be screened more often.  Skin Cancer  Check your skin from head to toe regularly.  Monitor any moles. Be sure to tell your health care provider: ? About any new moles or changes in moles, especially if there is a change in a mole's shape or color. ? If you have a mole that is larger than the size of a pencil eraser.  If any of your family members has a history of skin cancer, especially at a young age, talk with your health care provider about genetic screening.  Always use sunscreen. Apply sunscreen liberally and repeatedly throughout the day.  Whenever you are outside, protect  yourself by wearing long sleeves, pants, a wide-brimmed hat, and sunglasses.  What should I know about osteoporosis? Osteoporosis is a condition in which bone destruction happens more quickly than new bone creation. After menopause, you may be at an increased risk for osteoporosis. To help prevent osteoporosis or the bone fractures that can happen because of osteoporosis, the following is recommended:  If you are 83-19 years old, get at least 1,000 mg of calcium and at least 600 mg of vitamin D per day.  If you are older than age 85 but younger than age 10, get at least 1,200 mg of calcium and at least 600 mg of vitamin D per day.  If you are older than age 73, get at least 1,200 mg of calcium  and at least 800 mg of vitamin D per day.  Smoking and excessive alcohol intake increase the risk of osteoporosis. Eat foods that are rich in calcium and vitamin D, and do weight-bearing exercises several times each week as directed by your health care provider. What should I know about how menopause affects my mental health? Depression may occur at any age, but it is more common as you become older. Common symptoms of depression include:  Low or sad mood.  Changes in sleep patterns.  Changes in appetite or eating patterns.  Feeling an overall lack of motivation or enjoyment of activities that you previously enjoyed.  Frequent crying spells.  Talk with your health care provider if you think that you are experiencing depression. What should I know about immunizations? It is important that you get and maintain your immunizations. These include:  Tetanus, diphtheria, and pertussis (Tdap) booster vaccine.  Influenza every year before the flu season begins.  Pneumonia vaccine.  Shingles vaccine.  Your health care provider may also recommend other immunizations. This information is not intended to replace advice given to you by your health care provider. Make sure you discuss any questions you  have with your health care provider. Document Released: 09/26/2005 Document Revised: 02/22/2016 Document Reviewed: 05/08/2015 Elsevier Interactive Patient Education  2018 [employer].

## 2018-03-17 ENCOUNTER — Encounter: Payer: Self-pay | Admitting: Family Medicine

## 2018-03-17 ENCOUNTER — Ambulatory Visit (HOSPITAL_BASED_OUTPATIENT_CLINIC_OR_DEPARTMENT_OTHER)
Admission: RE | Admit: 2018-03-17 | Discharge: 2018-03-17 | Disposition: A | Discharge status: Home / Self Care | Payer: Medicare Other | Source: Ambulatory Visit | Attending: Family Medicine | Admitting: Family Medicine

## 2018-03-17 ENCOUNTER — Other Ambulatory Visit: Payer: Self-pay | Admitting: Family Medicine

## 2018-03-17 ENCOUNTER — Ambulatory Visit: Payer: Medicare Other | Admitting: Family Medicine

## 2018-03-17 VITALS — BP 138/68 | HR 63 | Temp 98.0°F | Ht 61.0 in | Wt 211.1 lb

## 2018-03-17 DIAGNOSIS — E1165 Type 2 diabetes mellitus with hyperglycemia: Secondary | ICD-10-CM | POA: Diagnosis not present

## 2018-03-17 DIAGNOSIS — M79672 Pain in left foot: Secondary | ICD-10-CM | POA: Diagnosis not present

## 2018-03-17 LAB — HEMOGLOBIN A1C: HEMOGLOBIN A1C: 9.2 % — AB (ref 4.6–6.5)

## 2018-03-17 MED ORDER — CANAGLIFLOZIN 300 MG PO TABS: 300.0000 mg | ORAL_TABLET | Freq: Every day | ORAL | 2 refills | Status: DC

## 2018-03-17 NOTE — Progress Notes (Signed)
Pre visit review using our clinic review tool, if applicable. No additional management support is needed unless otherwise documented below in the visit note. 

## 2018-03-17 NOTE — Patient Instructions (Signed)
We will make the next decision based on the results of your labs.   Do your best with your diet and staying active.  Let us know if you need anything.

## 2018-03-17 NOTE — Progress Notes (Signed)
Subjective:   Chief Complaint  Patient presents with  . Follow-up    Elizabeth [name] is a 83 y.o. female here for follow-up of diabetes.  Husband recently dx'd with CC.  Elizabeth [name] has not been checking her sugars.Patient does not require insulin.   Medications include: Invokana 100 mg/d, metformin 500 XR mg/d; reports compliance. Reports diet has not been healthy overall. Patient exercises 7 days per week on average.   Last A1c was 8.2.  Past Medical History:  Diagnosis Date  . Allergy   . CAD (coronary artery disease)   . Diabetes mellitus    type II  . Diabetic retinopathy    [facility]  . Gastroenteritis 02/13/2013  . History of GI bleed    secondary to heparin  . Hypertension   . LBP (low back pain)   . Muscle cramp 02/13/2013  . Right hip pain 02/13/2013  . Shingles   . Tubal pregnancy     Review of Systems: Pulmonary:  No SOB Cardiovascular:  No chest pain  Objective:  BP 138/68 (BP Location: Right Arm, Patient Position: Sitting, Cuff Size: Large)   Pulse 63   Temp 98 F (36.7 C) (Oral)   Ht 5\' 1"  (1.549 m)   Wt 211 lb 2 oz (95.8 kg)   SpO2 96%   BMI 39.89 kg/m  General:  Well developed, well nourished, in no apparent distress Skin:  Warm, no pallor or diaphoresis Lungs: no access msc use Neuro:  Sensation intact to pinprick on feet MSK: +TTP over 1-2nd metatarsal on L  Psych: Age appropriate judgment and insight  Assessment:   Uncontrolled type 2 diabetes mellitus with hyperglycemia (HCC) - Plan: HM Diabetes Foot Exam, Hemoglobin A1c  Left foot pain - Plan: DG Foot Complete Left   Plan:   Orders as above. Counseled on diet and exercise. Do best with this. May increase dose of Invokana. F/u in 3 mo. The patient voiced understanding and agreement to the plan.  Jilda Rocheicholas Paul FranklinWendling, DO 03/17/18 9:38 AM

## 2018-04-04 ENCOUNTER — Other Ambulatory Visit: Payer: Self-pay | Admitting: Family Medicine

## 2018-04-18 ENCOUNTER — Other Ambulatory Visit: Payer: Self-pay | Admitting: Family Medicine

## 2018-04-18 DIAGNOSIS — E11319 Type 2 diabetes mellitus with unspecified diabetic retinopathy without macular edema: Secondary | ICD-10-CM

## 2018-05-17 ENCOUNTER — Ambulatory Visit: Payer: Medicare Other | Admitting: Family Medicine

## 2018-05-20 ENCOUNTER — Encounter: Payer: Medicare Other | Admitting: Family Medicine

## 2018-06-13 ENCOUNTER — Other Ambulatory Visit: Payer: Self-pay | Admitting: Family Medicine

## 2018-06-13 DIAGNOSIS — I5022 Chronic systolic (congestive) heart failure: Secondary | ICD-10-CM

## 2018-06-14 ENCOUNTER — Ambulatory Visit: Payer: Medicare Other | Admitting: Family Medicine

## 2018-06-17 ENCOUNTER — Ambulatory Visit: Payer: Medicare Other | Admitting: Family Medicine

## 2018-06-20 ENCOUNTER — Other Ambulatory Visit: Payer: Self-pay | Admitting: Family Medicine

## 2018-07-04 ENCOUNTER — Other Ambulatory Visit: Payer: Self-pay | Admitting: Family Medicine

## 2018-07-07 ENCOUNTER — Ambulatory Visit: Payer: Medicare Other | Admitting: Family Medicine

## 2018-07-18 ENCOUNTER — Other Ambulatory Visit: Payer: Self-pay | Admitting: Family Medicine

## 2018-07-18 DIAGNOSIS — E11319 Type 2 diabetes mellitus with unspecified diabetic retinopathy without macular edema: Secondary | ICD-10-CM

## 2018-08-16 ENCOUNTER — Ambulatory Visit: Payer: Medicare Other | Admitting: Family Medicine

## 2018-09-06 ENCOUNTER — Ambulatory Visit: Payer: Medicare Other | Admitting: Family Medicine

## 2018-09-19 ENCOUNTER — Other Ambulatory Visit: Payer: Self-pay | Admitting: Family Medicine

## 2018-10-11 ENCOUNTER — Ambulatory Visit: Payer: Medicare Other | Admitting: Family Medicine

## 2018-10-17 ENCOUNTER — Other Ambulatory Visit: Payer: Self-pay | Admitting: Family Medicine

## 2018-10-17 DIAGNOSIS — I5022 Chronic systolic (congestive) heart failure: Secondary | ICD-10-CM

## 2018-10-18 ENCOUNTER — Other Ambulatory Visit: Payer: Self-pay | Admitting: Family Medicine

## 2018-11-10 ENCOUNTER — Ambulatory Visit: Payer: Medicare Other | Admitting: Family Medicine

## 2018-11-14 ENCOUNTER — Other Ambulatory Visit: Payer: Self-pay | Admitting: Family Medicine

## 2018-11-14 DIAGNOSIS — E11319 Type 2 diabetes mellitus with unspecified diabetic retinopathy without macular edema: Secondary | ICD-10-CM

## 2018-12-09 ENCOUNTER — Other Ambulatory Visit: Payer: Self-pay | Admitting: Family Medicine

## 2018-12-10 ENCOUNTER — Other Ambulatory Visit: Payer: Self-pay | Admitting: Family Medicine

## 2018-12-10 NOTE — Telephone Encounter (Signed)
SGLT2 inhibitors - canagliflozin failed Pt needs an appointment.  Requested Prescriptions  Refused Prescriptions Disp Refills  . canagliflozin (INVOKANA) 300 MG TABS tablet 30 tablet 0     Endocrinology: Diabetes - SGLT2 Inhibitors - canagliflozin Failed - 12/10/2018 11:45 AM      Failed - HBA1C is between 0 and 7.9 and within 180 days    Hgb A1c MFr Bld  Date Value Ref Range Status  03/17/2018 9.2 (H) 4.6 - 6.5 % Final    Comment:    Glycemic Control Guidelines for People with Diabetes:Non Diabetic:  <6%Goal of Therapy: <7%Additional Action Suggested:  >8%          Failed - Cr in normal range and within 360 days    Creat  Date Value Ref Range Status  06/03/2013 1.21 (H) 0.50 - 1.10 mg/dL Final   Creatinine, Ser  Date Value Ref Range Status  11/05/2017 1.17 0.40 - 1.20 mg/dL Final         Failed - LDL in normal range and within 360 days    LDL Cholesterol  Date Value Ref Range Status  11/05/2017 36 0 - 99 mg/dL Final         Failed - eGFR in normal range and within 360 days    GFR, Est African American  Date Value Ref Range Status  08/01/2011 63 >60 mL/min Final   GFR calc Af Amer  Date Value Ref Range Status  06/04/2017 38 (L) >60 mL/min Final    Comment:    (NOTE) The eGFR has been calculated using the CKD EPI equation. This calculation has not been validated in all clinical situations. eGFR's persistently <60 mL/min signify possible Chronic Kidney Disease.    GFR, Est Non African American  Date Value Ref Range Status  08/01/2011 55 (L) >60 mL/min Final    Comment:      The estimated GFR is a calculation valid for adults (69 to 82 years old) that uses the CKD-EPI algorithm to adjust for age and sex. It is not to be used for children, pregnant women, hospitalized patients, patients on dialysis, or with rapidly changing kidney function. According to the NKDEP, eGFR >89 is normal, 60-89 shows mild impairment, 30-59 shows moderate impairment, 15-29 shows  severe impairment and <15 is ESRD.   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)   Archivist at [place], [place]   1 year ago Dyspnea on exertion   Archivist at [facility]  [place], DO      Future Appointments            In 3 weeks [place], [place] Oyster, DO [employer] at [facility], [place]   In 3 months [place], [place], Research scientist (physical sciences) at [facility], [place]

## 2019-01-05 ENCOUNTER — Telehealth: Payer: Self-pay | Admitting: Family Medicine

## 2019-01-05 ENCOUNTER — Ambulatory Visit (INDEPENDENT_AMBULATORY_CARE_PROVIDER_SITE_OTHER): Payer: Medicare Other | Admitting: Family Medicine

## 2019-01-05 ENCOUNTER — Encounter: Payer: Self-pay | Admitting: Family Medicine

## 2019-01-05 DIAGNOSIS — I5022 Chronic systolic (congestive) heart failure: Secondary | ICD-10-CM | POA: Diagnosis not present

## 2019-01-05 DIAGNOSIS — E11319 Type 2 diabetes mellitus with unspecified diabetic retinopathy without macular edema: Secondary | ICD-10-CM

## 2019-01-05 MED ORDER — CANAGLIFLOZIN 300 MG PO TABS: 300.0000 mg | ORAL_TABLET | Freq: Every day | ORAL | 5 refills | Status: DC

## 2019-01-05 NOTE — Telephone Encounter (Signed)
LVM FOR PT TO CALL AND SCHEDULE CPE AT E[facility]LIEST CONVENIENCE

## 2019-01-05 NOTE — Progress Notes (Signed)
Subjective:   Chief Complaint  Patient presents with  . Blood Sugar Problem    Elizabeth [name] is a 84 y.o. female here for follow-up of diabetes.  Due to COVID-19 pandemic, we are interacting via web portal for an electronic face-to-face visit. I verified patient's ID using 2 identifiers. Patient agreed to proceed with visit via this method. Patient is at home, I am at office. Patient and I are present for visit.  [name]'s self monitored glucose range is 130's. Patient denies hypoglycemic reactions. She checks her glucose levels 2 times per week. Patient does not require insulin.   Medications include: Metformin and Invokana Exercise: none Diet: Fair  Hypertension Patient presents for hypertension follow up. She does not monitor home blood pressures. She is compliant with medications. Patient has these side effects of medication: none She is sometimes adhering to a healthy diet overall. Exercise: none  +Hx of CHF, compliant with medications. Diet/exercise as above. Denies swelling, CP, sob.  Past Medical History:  Diagnosis Date  . Allergy   . CAD (coronary artery disease)   . Diabetes mellitus    type II  . Diabetic retinopathy    Dr Perley Jain @ [facility] (vitrectomy left eye)  . Gastroenteritis 02/13/2013  . History of GI bleed    secondary to heparin  . Hypertension   . LBP (low back pain)   . Muscle cramp 02/13/2013  . Right hip pain 02/13/2013  . Shingles   . Tubal pregnancy      Related testing: Date of retinal exam: Done Pneumovax: done Flu Shot: done  Review of Systems: Pulmonary:  No SOB Cardiovascular:  No chest pain  Objective:  No conversational dyspnea Age appropriate judgment and insight Nml affect and mood  Assessment:   Controlled type 2 diabetes mellitus with retinopathy, without long-term current use of insulin, macular edema presence unspecified, unspecified laterality, unspecified retinopathy severity (HCC) - Plan: canagliflozin (INVOKANA) 300  MG TABS tablet, CBC, Comprehensive metabolic panel, Lipid panel, Hemoglobin A1c, Microalbumin / creatinine urine ratio  Chronic systolic congestive heart failure, NYHA class 1 (HCC)   Plan:   Orders as above. Counseled on diet and exercise. Cont meds.  F/u in 6 mo for CPE or prn. The patient voiced understanding and agreement to the plan.  [name], DO 01/05/19 10:30 AM

## 2019-02-18 ENCOUNTER — Other Ambulatory Visit: Payer: Self-pay | Admitting: Family Medicine

## 2019-02-18 DIAGNOSIS — E11319 Type 2 diabetes mellitus with unspecified diabetic retinopathy without macular edema: Secondary | ICD-10-CM

## 2019-03-15 ENCOUNTER — Ambulatory Visit: Payer: Medicare Other | Admitting: *Deleted

## 2019-05-09 ENCOUNTER — Ambulatory Visit: Payer: Medicare Other | Admitting: *Deleted

## 2019-05-31 ENCOUNTER — Other Ambulatory Visit: Payer: Self-pay | Admitting: Family Medicine

## 2019-05-31 DIAGNOSIS — E11319 Type 2 diabetes mellitus with unspecified diabetic retinopathy without macular edema: Secondary | ICD-10-CM

## 2019-05-31 MED ORDER — METFORMIN HCL ER 500 MG PO TB24: ORAL_TABLET | ORAL | 2 refills | Status: DC

## 2019-05-31 MED ORDER — METOPROLOL TARTRATE 50 MG PO TABS: ORAL_TABLET | ORAL | 2 refills | Status: DC

## 2019-05-31 MED ORDER — CLOPIDOGREL BISULFATE 75 MG PO TABS: ORAL_TABLET | ORAL | 2 refills | Status: DC

## 2019-05-31 MED ORDER — ATORVASTATIN CALCIUM 40 MG PO TABS: ORAL_TABLET | ORAL | 2 refills | Status: DC

## 2019-06-09 ENCOUNTER — Ambulatory Visit: Payer: Medicare Other | Admitting: *Deleted

## 2019-07-19 ENCOUNTER — Encounter: Payer: Medicare Other | Admitting: Family Medicine

## 2019-08-10 ENCOUNTER — Other Ambulatory Visit: Payer: Self-pay | Admitting: Family Medicine

## 2019-08-10 DIAGNOSIS — I5022 Chronic systolic (congestive) heart failure: Secondary | ICD-10-CM

## 2019-08-10 NOTE — Telephone Encounter (Signed)
Copied from [facility] [id number]. Topic: Quick Communication - Rx Refill/Question >> Aug 10, 2019  3:14 PM Izola Price, [place] A wrote: Medication: furosemide (LASIX) 40 MG tablet,glucose blood test strip (Pharmacy is requesting that medications be sent over)  Has the patient contacted their pharmacy? Yes (Agent: If no, request that the patient contact the pharmacy for the refill.) (Agent: If yes, when and what did the pharmacy advise?)Contact PCP  Preferred Pharmacy (with phone number or street name): [facility] [address] [place], [place].  Phone:  [phone] Fax:  [phone]     Agent: Please be advised that RX refills may take up to 3 business days. We ask that you follow-up with your pharmacy.

## 2019-08-17 ENCOUNTER — Other Ambulatory Visit: Payer: Self-pay | Admitting: Family Medicine

## 2019-08-17 DIAGNOSIS — I5022 Chronic systolic (congestive) heart failure: Secondary | ICD-10-CM

## 2019-08-17 MED ORDER — FUROSEMIDE 40 MG PO TABS: ORAL_TABLET | ORAL | 0 refills | Status: DC

## 2019-08-17 NOTE — Telephone Encounter (Signed)
Requested medication (s) are due for refill today: yes  Requested medication (s) are on the active medication list: yes  Last refill:  10/17/2018  Future visit scheduled: yes  Notes to clinic: Patient has a appointment on 09/12/2018 Review for refill   Requested Prescriptions  Pending Prescriptions Disp Refills   furosemide (LASIX) 40 MG tablet 90 tablet 0      Cardiovascular:  Diuretics - Loop Failed - 08/17/2019 12:18 PM      Failed - K in normal range and within 360 days    Potassium  Date Value Ref Range Status  11/05/2017 5.0 3.5 - 5.1 mEq/L Final          Failed - Ca in normal range and within 360 days    Calcium  Date Value Ref Range Status  11/05/2017 9.3 8.4 - 10.5 mg/dL Final          Failed - Na in normal range and within 360 days    Sodium  Date Value Ref Range Status  11/05/2017 141 135 - 145 mEq/L Final          Failed - Cr in normal range and within 360 days    Creat  Date Value Ref Range Status  06/03/2013 1.21 (H) 0.50 - 1.10 mg/dL Final   Creatinine, Ser  Date Value Ref Range Status  11/05/2017 1.17 0.40 - 1.20 mg/dL Final   Creatinine,U  Date Value Ref Range Status  11/05/2017 10.1 mg/dL Final   Creatinine, Urine  Date Value Ref Range Status  06/17/2012 68.1 mg/dL Final          Failed - Valid encounter within last 6 months    Recent Outpatient Visits           7 months ago Controlled type 2 diabetes mellitus with retinopathy, without long-term current use of insulin, macular edema presence unspecified, unspecified laterality, unspecified retinopathy severity ([place])   Archivist at [facility], [place], DO   1 year ago Uncontrolled type 2 diabetes mellitus with hyperglycemia ([place])   Archivist at [facility], [place], DO   1 year ago Controlled type 2 diabetes mellitus with retinopathy, without long-term current use of insulin, macular edema presence  unspecified, unspecified laterality, unspecified retinopathy severity ([place])   Archivist at [facility], [place], DO   1 year ago Chronic systolic congestive heart failure, NYHA class 1 ([place])   Archivist at [employer] [facility], [place], DO   2 years ago Acute on chronic diastolic heart failure [facility])   Archivist at [employer] [facility] [facility], [place], DO       Future Appointments             In 3 weeks Nani Ravens, Crosby Oyster, [place] at [facility], [place] BP in normal range    BP Readings from Last 1 Encounters:  03/17/18 138/68

## 2019-08-17 NOTE — Telephone Encounter (Signed)
Copied from [place] [id number]. Topic: Quick Communication - Rx Refill/Question >> Aug 17, 2019 12:06 PM Leward Quan A wrote: Medication: furosemide (LASIX) 40 MG tablet   Has the patient contacted their pharmacy? Yes.   (Agent: If no, request that the patient contact the pharmacy for the refill.) (Agent: If yes, when and what did the pharmacy advise?)  Preferred Pharmacy (with phone number or street name): [facility] [address] [place], [place].  Phone:  [phone] Fax:  [phone]     Agent: Please be advised that RX refills may take up to 3 business days. We ask that you follow-up with your pharmacy.

## 2019-08-23 DIAGNOSIS — M25561 Pain in right knee: Secondary | ICD-10-CM | POA: Diagnosis not present

## 2019-08-29 DIAGNOSIS — M25561 Pain in right knee: Secondary | ICD-10-CM | POA: Diagnosis not present

## 2019-08-31 ENCOUNTER — Ambulatory Visit: Payer: Medicare Other | Attending: Internal Medicine

## 2019-08-31 DIAGNOSIS — Z23 Encounter for immunization: Secondary | ICD-10-CM | POA: Insufficient documentation

## 2019-08-31 NOTE — Progress Notes (Signed)
   Covid-19 Vaccination Clinic  Name:  Elizabeth [name]    MRN: [id number] DOB: [birth year]  08/31/2019  [name] was observed post Covid-19 immunization for 30 minutes based on pre-vaccination screening without incidence. She was provided with Vaccine Information Sheet and instruction to access the V-Safe system.   [name] was instructed to call 911 with any severe reactions post vaccine: Marland Kitchen Difficulty breathing  . Swelling of your face and throat  . A fast heartbeat  . A bad rash all over your body  . Dizziness and weakness    Immunizations Administered    Name Date Dose VIS Date Route   Pfizer COVID-19 Vaccine 08/31/2019 11:32 AM 0.3 mL 07/29/2019 Intramuscular   Manufacturer: [employer], [employer]   Lot: [id number]   NDC: [address]-5

## 2019-09-01 DIAGNOSIS — S83241A Other tear of medial meniscus, current injury, right knee, initial encounter: Secondary | ICD-10-CM | POA: Diagnosis not present

## 2019-09-13 ENCOUNTER — Other Ambulatory Visit: Payer: Self-pay | Admitting: Orthopedic Surgery

## 2019-09-13 ENCOUNTER — Encounter: Payer: Medicare Other | Admitting: Family Medicine

## 2019-09-14 ENCOUNTER — Telehealth: Payer: Self-pay | Admitting: Family Medicine

## 2019-09-14 NOTE — Telephone Encounter (Signed)
Patient has appt with PCP on 09/21/19 for clearance.

## 2019-09-14 NOTE — Telephone Encounter (Signed)
Called [facility] left detailed message with Lurena Joiner form received//appt on 09/21/19 and will fax when completed.

## 2019-09-14 NOTE — Telephone Encounter (Signed)
Elizabeth [name],) from [facility] called today inquiring  about a surgery clearance form sent to our office on 09/01/2019. Elizabeth [name] would like the status on that.

## 2019-09-20 ENCOUNTER — Ambulatory Visit: Payer: Medicare PPO | Attending: Internal Medicine

## 2019-09-20 ENCOUNTER — Other Ambulatory Visit: Payer: Self-pay

## 2019-09-20 DIAGNOSIS — Z23 Encounter for immunization: Secondary | ICD-10-CM | POA: Insufficient documentation

## 2019-09-20 NOTE — Progress Notes (Signed)
   Covid-19 Vaccination Clinic  Name:  ABREA HENLE    MRN: [id number] DOB: 01-29-1935  09/20/2019  Ms. Agcaoili was observed post Covid-19 immunization for 15 minutes without incidence. She was provided with Vaccine Information Sheet and instruction to access the V-Safe system.   Ms. Vidrine was instructed to call 911 with any severe reactions post vaccine: Marland Kitchen Difficulty breathing  . Swelling of your face and throat  . A fast heartbeat  . A bad rash all over your body  . Dizziness and weakness    Immunizations Administered    Name Date Dose VIS Date Route   Pfizer COVID-19 Vaccine 09/20/2019 11:02 AM 0.3 mL 07/29/2019 Intramuscular   Manufacturer: [employer], [employer]   Lot: [id number]   NDC: [address]-0

## 2019-09-21 ENCOUNTER — Encounter: Payer: Self-pay | Admitting: Family Medicine

## 2019-09-21 ENCOUNTER — Ambulatory Visit: Payer: Medicare PPO | Admitting: Family Medicine

## 2019-09-21 ENCOUNTER — Other Ambulatory Visit: Payer: Self-pay

## 2019-09-21 VITALS — BP 120/84 | HR 80 | Temp 96.3°F | Ht 61.0 in | Wt 218.0 lb

## 2019-09-21 DIAGNOSIS — Z01818 Encounter for other preprocedural examination: Secondary | ICD-10-CM

## 2019-09-21 DIAGNOSIS — E1165 Type 2 diabetes mellitus with hyperglycemia: Secondary | ICD-10-CM | POA: Diagnosis not present

## 2019-09-21 LAB — CBC
HCT: 47.5 % — ABNORMAL HIGH (ref 36.0–46.0)
Hemoglobin: 15.4 g/dL — ABNORMAL HIGH (ref 12.0–15.0)
MCHC: 32.3 g/dL (ref 30.0–36.0)
MCV: 96.9 fl (ref 78.0–100.0)
Platelets: 277 10*3/uL (ref 150.0–400.0)
RBC: 4.91 Mil/uL (ref 3.87–5.11)
RDW: 13.9 % (ref 11.5–15.5)
WBC: 7.9 10*3/uL (ref 4.0–10.5)

## 2019-09-21 LAB — LIPID PANEL
Cholesterol: 149 mg/dL (ref 0–200)
HDL: 47.9 mg/dL (ref >39.00)
LDL Cholesterol: 77 mg/dL (ref 0–99)
NonHDL: 100.96
Total CHOL/HDL Ratio: 3
Triglycerides: 120 mg/dL (ref 0.0–149.0)
VLDL: 24 mg/dL (ref 0.0–40.0)

## 2019-09-21 LAB — COMPREHENSIVE METABOLIC PANEL
ALT: 21 U/L (ref 0–35)
AST: 16 U/L (ref 0–37)
Albumin: 4 g/dL (ref 3.5–5.2)
Alkaline Phosphatase: 70 U/L (ref 39–117)
BUN: 21 mg/dL (ref 6–23)
CO2: 28 mEq/L (ref 19–32)
Calcium: 9.5 mg/dL (ref 8.4–10.5)
Chloride: 103 mEq/L (ref 96–112)
Creatinine, Ser: 1.12 mg/dL (ref 0.40–1.20)
GFR: 46.53 mL/min — ABNORMAL LOW (ref >60.00)
Glucose, Bld: 262 mg/dL — ABNORMAL HIGH (ref 70–99)
Potassium: 5.7 mEq/L — ABNORMAL HIGH (ref 3.5–5.1)
Sodium: 138 mEq/L (ref 135–145)
Total Bilirubin: 0.6 mg/dL (ref 0.2–1.2)
Total Protein: 6.4 g/dL (ref 6.0–8.3)

## 2019-09-21 LAB — HEMOGLOBIN A1C: Hgb A1c MFr Bld: 11.8 % — ABNORMAL HIGH (ref 4.6–6.5)

## 2019-09-21 NOTE — Progress Notes (Signed)
Subjective:   Chief Complaint  Patient presents with  . Medical Clearance    Elizabeth [name]  is here for a Pre-operative physical at the request of Dr. Turner Daniels.   She  is having arthroscopic knee surgery on 2/22  for a torn meniscus.  Personal or family hx of adverse outcome to anesthesia? No  Chipped, cracked, missing, or loose teeth? partial dentures on lower teeth Decreased ROM of neck? No  Able to walk up 2 flights of stairs without becoming significantly short of breath or having chest pain? Yes  (when not having knee pain)  Revised Goldman Criteria: High Risk Surgery (intraperitoneal, intrathoracic, aortic): No  Ischemic heart disease (Prior MI, +excercise stress test, angina, nitrate use, Qwave): No  History of heart failure: Yes  History of cerebrovascular disease: No  History of diabetes: Yes  Insulin therapy for DM: No  Preoperative Cr >2.0: No    Patient Active Problem List   Diagnosis Date Noted  . Chronic systolic congestive heart failure, NYHA class 1 (HCC) 08/26/2017  . Acute diastolic (congestive) heart failure (HCC) 06/02/2017  . Acute on chronic diastolic CHF (congestive heart failure) (HCC) 06/01/2017  . Bladder spasm 06/01/2017  . URI (upper respiratory infection) 11/29/2014  . Diabetic neuropathy (HCC) 11/17/2014  . Right hip pain 02/13/2013  . Gastroenteritis 02/13/2013  . Muscle cramp 02/13/2013  . Chronic cough 06/22/2012  . Postmenopausal bone loss 02/11/2012  . Anemia 02/11/2012  . BACK PAIN, LUMBAR, CHRONIC 06/19/2010  . BACK PAIN, THORACIC REGION 04/15/2010  . Coronary atherosclerosis 09/24/2009  . Diabetes mellitus type 2, controlled (HCC) 09/20/2009  . Essential hypertension 09/20/2009  . MYOCARDIAL INFARCTION, HX OF 09/20/2009  . ALLERGIC RHINITIS 09/20/2009   Past Medical History:  Diagnosis Date  . Allergy   . CAD (coronary artery disease)   . Diabetes mellitus    type II  . Diabetic retinopathy    Dr Perley Jain @ [facility] (vitrectomy left eye)   . Gastroenteritis 02/13/2013  . History of GI bleed    secondary to heparin  . Hypertension   . LBP (low back pain)   . Muscle cramp 02/13/2013  . Right hip pain 02/13/2013  . Shingles   . Tubal pregnancy     Past Surgical History:  Procedure Laterality Date  . APPENDECTOMY    . CATARACT EXTRACTION    . EYE SURGERY    . LAMINOTOMY     Dr Jordan Likes L3-4  and microdiskectomy  . TONSILLECTOMY    . TUBAL LIGATION      Current Outpatient Medications  Medication Sig Dispense Refill  . acetaminophen (TYLENOL) 500 MG tablet Take 500 mg by mouth every 6 (six) hours as needed for headache (pain).    . ASPIRIN LOW DOSE 81 MG EC tablet TAKE 1 TABLET BY MOUTH DAILY 90 tablet 0  . atorvastatin (LIPITOR) 40 MG tablet TAKE 1 TABLET(40 MG) BY MOUTH DAILY 90 tablet 2  . canagliflozin (INVOKANA) 300 MG TABS tablet Take 1 tablet (300 mg total) by mouth daily before breakfast. 30 tablet 5  . clopidogrel (PLAVIX) 75 MG tablet TAKE 1 TABLET(75 MG) BY MOUTH EVERY MORNING 90 tablet 2  . furosemide (LASIX) 40 MG tablet TAKE 1 TABLET(40 MG) BY MOUTH DAILY 90 tablet 0  . glucose blood test strip Use as instructed to check blood sugar 3 times per day Diagnoses Code 250.00 100 each 12  . Lancets (FREESTYLE) lancets Use as instructed to check blood sugar 3 times per day Diagnoses Code 250.00  300 each prn  . loperamide (IMODIUM A-D) 2 MG capsule Take 2 mg by mouth as needed for diarrhea or loose stools.    . metFORMIN (GLUCOPHAGE-XR) 500 MG 24 hr tablet TAKE 1 TABLET BY MOUTH EVERY DAY WITH BREAKFAST 90 tablet 2  . metoprolol tartrate (LOPRESSOR) 50 MG tablet TAKE 1/2 TABLET(25 MG) BY MOUTH TWICE DAILY 90 tablet 2  . nitroGLYCERIN (NITROSTAT) 0.4 MG SL tablet Place 1 tablet (0.4 mg total) under the tongue every 5 (five) minutes as needed for chest pain. 15 tablet 0  . Olopatadine HCl (PATADAY) 0.2 % SOLN Place 1 drop into both eyes daily.    Marland Kitchen OVER THE COUNTER MEDICATION Apply 1 application topically 2 (two) times  daily as needed (eczema). "Exerderm"     Allergies  Allergen Reactions  . Sulfa Antibiotics Hives  . Cyclobenzaprine Hcl Rash    Family History  Problem Relation Age of Onset  . Stroke Father   . Coronary artery disease Unknown   . Heart disease Mother      Review of Systems:  Constitutional:  no fevers Eye:  no recent significant change in vision Ear:  no hearing loss Nose/Mouth/Throat:  No dental complaints Neck/Thyroid:  no lumps or masses Pulmonary:  No shortness of breath Cardiovascular:  no chest pain Gastrointestinal:  no abdominal pain GU:  negative for dysuria Musculoskeletal/Extremities:  +R knee pain Skin/Integumentary ROS:  no abnormal skin lesions reported Neurologic:  no HA   Objective:   Vitals:   09/21/19 1100  BP: 120/84  Pulse: 80  Temp: (!) 96.3 F (35.7 C)  TempSrc: Temporal  SpO2: 96%  Weight: 218 lb (98.9 kg)  Height: 5\' 1"  (1.549 m)   Body mass index is 41.19 kg/m.  General:  well developed, well nourished, in no apparent distress Skin:  warm, no pallor or diaphoresis Head:  normocephalic, atraumatic Eyes:  pupils equal and round, sclera anicteric without injection Ears:  canals without lesions, TMs shiny without retraction, no obvious effusion, no erythema Throat/Pharynx:  lips and gingiva without lesion; tongue and uvula midline; non-inflamed pharynx; no exudates or postnasal drainage Neck: neck supple without adenopathy, thyromegaly, or masses Lungs:  clear to auscultation, breath sounds equal bilaterally, no respiratory distress Cardio:  regular rate and rhythm, 1+ LE pitting edema b/l Abdomen:  abdomen soft, nontender; bowel sounds normal; no masses, hepatomegaly or splenomegaly Musculoskeletal:  symmetrical muscle groups noted without atrophy or deformity Extremities:  no clubbing, cyanosis, no deformities, no skin discoloration Neuro:  gait antalgic Psych: Age appropriate judgment and insight; normal mood   Assessment:    Preop examination - Plan: CBC, Comprehensive metabolic panel  Type 2 diabetes mellitus with hyperglycemia, without long-term current use of insulin (HCC) - Plan: Hemoglobin A1c, Lipid panel, Microalbumin / creatinine urine ratio   Plan:   Pending the above workup, the patient is deemed low risk for the proposed procedure. Is getting clearance from cardiologist next week. Barring lab results, should be good from our standpoint.  Not interested in injections. Would consider dpp-4. Cannot do Actos 2/2 CHF/hx of LE edema. Would try to avoid SU's and insulin if possible.  Stop DAPT 5 d prior to procedure, 2/16 is last dose.  F/u in 6 mo for CPE pending a1c.  The patient voiced understanding and agreement to the plan.  [place], DO 09/21/19  12:17 PM

## 2019-09-21 NOTE — Patient Instructions (Signed)
Give us 2-3 business days to get the results of your labs back.   Keep the diet clean and stay active.  Let us know if you need anything. 

## 2019-09-26 ENCOUNTER — Other Ambulatory Visit: Payer: Self-pay | Admitting: Family Medicine

## 2019-09-26 DIAGNOSIS — E875 Hyperkalemia: Secondary | ICD-10-CM

## 2019-09-26 MED ORDER — SITAGLIPTIN PHOSPHATE 100 MG PO TABS: 100.0000 mg | ORAL_TABLET | Freq: Every day | ORAL | 1 refills | Status: DC

## 2019-09-27 ENCOUNTER — Other Ambulatory Visit: Payer: Self-pay

## 2019-09-27 ENCOUNTER — Telehealth: Payer: Self-pay | Admitting: Family Medicine

## 2019-09-27 ENCOUNTER — Other Ambulatory Visit (INDEPENDENT_AMBULATORY_CARE_PROVIDER_SITE_OTHER): Payer: Medicare PPO

## 2019-09-27 DIAGNOSIS — Z955 Presence of coronary angioplasty implant and graft: Secondary | ICD-10-CM | POA: Diagnosis not present

## 2019-09-27 DIAGNOSIS — I2510 Atherosclerotic heart disease of native coronary artery without angina pectoris: Secondary | ICD-10-CM | POA: Diagnosis not present

## 2019-09-27 DIAGNOSIS — E11319 Type 2 diabetes mellitus with unspecified diabetic retinopathy without macular edema: Secondary | ICD-10-CM | POA: Diagnosis not present

## 2019-09-27 DIAGNOSIS — Z01810 Encounter for preprocedural cardiovascular examination: Secondary | ICD-10-CM | POA: Diagnosis not present

## 2019-09-27 DIAGNOSIS — I10 Essential (primary) hypertension: Secondary | ICD-10-CM | POA: Diagnosis not present

## 2019-09-27 DIAGNOSIS — E875 Hyperkalemia: Secondary | ICD-10-CM

## 2019-09-27 DIAGNOSIS — E785 Hyperlipidemia, unspecified: Secondary | ICD-10-CM | POA: Diagnosis not present

## 2019-09-27 LAB — LIPID PANEL
Cholesterol: 138 mg/dL (ref 0–200)
HDL: 42.4 mg/dL (ref >39.00)
LDL Cholesterol: 72 mg/dL (ref 0–99)
NonHDL: 95.99
Total CHOL/HDL Ratio: 3
Triglycerides: 121 mg/dL (ref 0.0–149.0)
VLDL: 24.2 mg/dL (ref 0.0–40.0)

## 2019-09-27 LAB — COMPREHENSIVE METABOLIC PANEL
ALT: 20 U/L (ref 0–35)
AST: 15 U/L (ref 0–37)
Albumin: 3.9 g/dL (ref 3.5–5.2)
Alkaline Phosphatase: 78 U/L (ref 39–117)
BUN: 28 mg/dL — ABNORMAL HIGH (ref 6–23)
CO2: 31 mEq/L (ref 19–32)
Calcium: 9.3 mg/dL (ref 8.4–10.5)
Chloride: 100 mEq/L (ref 96–112)
Creatinine, Ser: 1.19 mg/dL (ref 0.40–1.20)
GFR: 43.38 mL/min — ABNORMAL LOW (ref >60.00)
Glucose, Bld: 307 mg/dL — ABNORMAL HIGH (ref 70–99)
Potassium: 5.2 mEq/L — ABNORMAL HIGH (ref 3.5–5.1)
Sodium: 137 mEq/L (ref 135–145)
Total Bilirubin: 0.5 mg/dL (ref 0.2–1.2)
Total Protein: 6.2 g/dL (ref 6.0–8.3)

## 2019-09-27 LAB — CBC
HCT: 47.2 % — ABNORMAL HIGH (ref 36.0–46.0)
Hemoglobin: 15.3 g/dL — ABNORMAL HIGH (ref 12.0–15.0)
MCHC: 32.4 g/dL (ref 30.0–36.0)
MCV: 96.3 fl (ref 78.0–100.0)
Platelets: 246 10*3/uL (ref 150.0–400.0)
RBC: 4.9 Mil/uL (ref 3.87–5.11)
RDW: 13.9 % (ref 11.5–15.5)
WBC: 8.2 10*3/uL (ref 4.0–10.5)

## 2019-09-27 LAB — HEMOGLOBIN A1C: Hgb A1c MFr Bld: 11.7 % — ABNORMAL HIGH (ref 4.6–6.5)

## 2019-09-27 LAB — MICROALBUMIN / CREATININE URINE RATIO
Creatinine,U: 72.6 mg/dL
Microalb Creat Ratio: 1.7 mg/g (ref 0.0–30.0)
Microalb, Ur: 1.3 mg/dL (ref 0.0–1.9)

## 2019-09-27 NOTE — Telephone Encounter (Signed)
Pt states she was told to call back if her most recent script was too expensive. Well she got a call that the meds was over 600.00 so she's calling for another option.  Please advise

## 2019-09-27 NOTE — Telephone Encounter (Signed)
Called the patient updated her medication list//she agreed to increase the metformin. Yes, she is taking the invokana. No refill needed at this time.

## 2019-09-27 NOTE — Telephone Encounter (Signed)
Please advise on something less expensive

## 2019-09-27 NOTE — Telephone Encounter (Signed)
Let's have her take 2 tabs daily of her metformin and let us know when she needs a refill. Make sure she is taking Invokana. Plz update chart. TY.

## 2019-09-28 DIAGNOSIS — R9431 Abnormal electrocardiogram [ECG] [EKG]: Secondary | ICD-10-CM | POA: Diagnosis not present

## 2019-09-28 NOTE — Telephone Encounter (Signed)
OK to send to [facility]. Ty.

## 2019-09-28 NOTE — Telephone Encounter (Signed)
Updated her metformin back to once daily. Informed her to start the Januvia and pickup at [facility] The patient verbalized understanding.

## 2019-09-28 NOTE — Telephone Encounter (Signed)
Pt is confused about medications and would like phone call from Robin. Call back [phone].

## 2019-09-28 NOTE — Telephone Encounter (Signed)
The patient called back to let us know that the Januvia had previously been sent to [facility] and [facility]. [facility] was around $600, but today she found out at [facility] it is only $40 and that she can do. So she is ok to pay $40 and if you want her to start on the Januvia she will do and not do the increase in the metformin then. Let me know and if ok she will go pickup Januvia at [facility] and start taking

## 2019-09-29 DIAGNOSIS — H3589 Other specified retinal disorders: Secondary | ICD-10-CM | POA: Diagnosis not present

## 2019-09-29 DIAGNOSIS — E113593 Type 2 diabetes mellitus with proliferative diabetic retinopathy without macular edema, bilateral: Secondary | ICD-10-CM | POA: Diagnosis not present

## 2019-09-29 DIAGNOSIS — H540X33 Blindness right eye category 3, blindness left eye category 3: Secondary | ICD-10-CM | POA: Diagnosis not present

## 2019-09-29 DIAGNOSIS — H3341 Traction detachment of retina, right eye: Secondary | ICD-10-CM | POA: Diagnosis not present

## 2019-09-29 DIAGNOSIS — Z961 Presence of intraocular lens: Secondary | ICD-10-CM | POA: Diagnosis not present

## 2019-09-30 ENCOUNTER — Encounter (HOSPITAL_BASED_OUTPATIENT_CLINIC_OR_DEPARTMENT_OTHER): Payer: Self-pay | Admitting: Orthopedic Surgery

## 2019-09-30 ENCOUNTER — Other Ambulatory Visit: Payer: Self-pay

## 2019-09-30 NOTE — Progress Notes (Addendum)
Spoke w/ via phone for pre-op interview [place] Lab needs dos----  I stat 8 , CBG           Lab results------ COVID test ------10-06-2019 Arrive at -------900 am 10-10-2019 NPO after ------midnight food, clear liquids until 900 am then npo Medications to take morning of surgery -----atorvastatin, metoprolol tartrate Diabetic medication -----none day of surgery Patient Special Instructions ----- Pre-Op special Istructions ----- Patient verbalized understanding of instructions that were given at this phone interview. Patient denies shortness of breath, chest pain, fever, cough a this phone interview.  Anesthesia: cad, blood thinner ADDENDUm; OK TO PROCEED, GET CBG DAY OF SURGERY PER JESSICA ZANETTO PA  PCP:dr wendling Cardiologist : dr Maisie Fus folk cardiac clearance 09-27-2019 care everywhere and on chart Chest x-ray :none EKG :09-27-2019 [facility] on chart Echo :none Cardiac Cath : none  Sleep Study/ CPAP :n/a Fasting Blood Sugar :      / Checks Blood Sugar -- times a day:  Checks cbg qod, fasting blood sugar 120 to 125 Blood Thinner/ Instructions /Last Dose: to stop plavix on 10-04-2019 per dr wendling instructions ASA / Instructions/ Last Dose : to stop 81 mg aspirin 10-04-2019 per dr wendling instructions  Patient denies shortness of breath, chest pain, fever, and cough at this phone interview.

## 2019-09-30 NOTE — Progress Notes (Signed)
Left surgical coordinator for dr Turner Daniels voicemail message need instructions for stopping 81 mg aspirin and plavix for 10-10-2019 surgery faxed to [phone].

## 2019-09-30 NOTE — Progress Notes (Signed)
Anesthesia Chart Review   Case: [id number] Date/Time: 10/10/19 1145   Procedure: RIGHT KNEE ARTHROSCOPY (Right Knee)   Anesthesia type: Choice   Pre-op diagnosis: RIGHT KNEE MEDIAL MENISCUS TEAR   Location: [facility] OR ROOM 3 / [place]   Surgeons: Frederik Pear, MD      DISCUSSION:85 y.o. never smoker with h/o HTN, CAD (+stent 2011), DM II, right knee medial meniscus tear scheduled for above procedure 10/10/19 with Dr. Frederik Pear.   Pt seen by PCP, Dr. Riki Sheer, 09/21/19 for preoperative evaluation.  Per OV note, "Pending the above workup, the patient is deemed low risk for the proposed procedure. Is getting clearance from cardiologist next week. Barring lab results, should be good from our standpoint.  Not interested in injections. Would consider dpp-4. Cannot do Actos 2/2 CHF/hx of LE edema. Would try to avoid SU's and insulin if possible.  Stop DAPT 5 d prior to procedure, 2/16 is last dose.  F/u in 6 mo for CPE pending a1c."  Cleared by cardiologist, Dr. Abran Richard, 09/27/2019.  Per OV note, "She is planning on knee surgery. The patient tells me she is having arthroscopy of the knee not arthroplasty. The note from the surgeon suggests arthroplasty. She is low risk to proceed on with arthroscopic surgery however if arthroplasty, knee replacement, is truly the surgery, she should have a stress test. I sent a note to the surgeon to clarify this."  Glucose 307 and A1C 11.7 on 09/27/2019.  Diabetes medications adjusted by PCP recently. Dr. Mayer Camel made aware.  VS: Ht 5\' 1"  (1.549 m)   Wt 94.3 kg   BMI 39.30 kg/m   PROVIDERS: Shelda Pal, DO is PCP   Abran Richard, MD is Cardiologist  LABS: Elevated glucose and Hemoglobin A1C, surgeon made aware.  (all labs ordered are listed, but only abnormal results are displayed)  Labs Reviewed - No data to display   IMAGES:   EKG: On chart   CV: Echo 03/11/2017 Study Conclusions   - Left ventricle: The cavity  size was normal. Wall thickness was  increased in a pattern of mild LVH. Systolic function was normal.  The estimated ejection fraction was in the range of 60% to 65%.  Wall motion was normal; there were no regional wall motion  abnormalities. Doppler parameters are consistent with abnormal  left ventricular relaxation (grade 1 diastolic dysfunction). The  E/e&' ratio is between 8-15, suggesting indeterminate LV filling  pressure.  - Left atrium: The atrium was normal in size.  - Tricuspid valve: There was mild regurgitation.  - Pulmonary arteries: PA peak pressure: 31 mm Hg (S).  - Inferior vena cava: The vessel was normal in size. The  respirophasic diameter changes were in the normal range (>= 50%),  consistent with normal central venous pressure.   Impressions:   - LVEF 60-65%, mild LVH, normal wall motion, grade 1 DD,  indeterminate LV filling pressure, normal LA size, mild TR, RVSP  31 mmHg, normal IVC.  Past Medical History:  Diagnosis Date  . Allergy   . 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 CAD (coronary artery disease)   . Diabetes mellitus    type II  . Diabetic retinopathy    Dr Shea Stakes @ [facility] (vitrectomy left eye)  . Gastroenteritis 02/13/2013  . History of GI bleed    secondary to heparin  . Hypertension   . Right hip pain 02/13/2013  . Shingles   .  Tubal pregnancy     Past Surgical History:  Procedure Laterality Date  . APPENDECTOMY    . CATARACT EXTRACTION Bilateral   . CORONARY ANGIOPLASTY  2010   heart stent x 1  . EYE SURGERY    . eye surgery for blood behind eye  left eye  . LAMINOTOMY     Dr Jordan Likes L3-4  and microdiskectomy  . surgery for tubal pregnancy    . TONSILLECTOMY    . TUBAL LIGATION      MEDICATIONS: No current facility-administered medications for this encounter.   Marland Kitchen acetaminophen (TYLENOL) 500 MG tablet  . ASPIRIN LOW DOSE 81 MG EC tablet  . atorvastatin (LIPITOR) 40 MG tablet  .  canagliflozin (INVOKANA) 300 MG TABS tablet  . clopidogrel (PLAVIX) 75 MG tablet  . furosemide (LASIX) 40 MG tablet  . glucose blood test strip  . Lancets (FREESTYLE) lancets  . loperamide (IMODIUM A-D) 2 MG capsule  . metFORMIN (GLUCOPHAGE-XR) 500 MG 24 hr tablet  . metoprolol tartrate (LOPRESSOR) 50 MG tablet  . nitroGLYCERIN (NITROSTAT) 0.4 MG SL tablet  . Olopatadine HCl (PATADAY) 0.2 % SOLN  . OVER THE COUNTER MEDICATION     Janey Genta [facility] Pre-Surgical Testing [phone] 09/30/19 2:29 PM

## 2019-10-06 ENCOUNTER — Other Ambulatory Visit: Payer: Self-pay | Admitting: Family Medicine

## 2019-10-06 ENCOUNTER — Inpatient Hospital Stay (HOSPITAL_COMMUNITY): Admission: RE | Admit: 2019-10-06 | Payer: Medicare PPO | Source: Ambulatory Visit

## 2019-10-06 MED ORDER — LOKELMA 10 G PO PACK: 10.0000 g | PACK | Freq: Once | ORAL | 0 refills | Status: AC

## 2019-10-07 ENCOUNTER — Other Ambulatory Visit: Payer: Self-pay | Admitting: Family Medicine

## 2019-10-07 DIAGNOSIS — D508 Other iron deficiency anemias: Secondary | ICD-10-CM

## 2019-10-07 DIAGNOSIS — E875 Hyperkalemia: Secondary | ICD-10-CM

## 2019-10-07 DIAGNOSIS — S83281A Other tear of lateral meniscus, current injury, right knee, initial encounter: Secondary | ICD-10-CM | POA: Diagnosis present

## 2019-10-07 MED ORDER — SODIUM POLYSTYRENE SULFONATE 15 GM/60ML PO SUSP: 30.0000 g | Freq: Once | ORAL | 0 refills | Status: AC

## 2019-10-07 NOTE — H&P (Signed)
Elizabeth [name] is an 85 y.o. female.    Chief Complaint: Right Knee Pain  HPI: Elizabeth [name] returns today with continued pain along the lateral aspect of her right knee.  MRI scan has been accomplished showing right knee lateral meniscal tear horizontal cleavage and multiplane.  She also has a good layer of remaining cartilage.  She has fallen since we last saw her and is now using a walker for ambulation.  Recall the cortisone injection provided about 1 day worth of pain relief and then it returns.  Past Medical History:  Diagnosis Date  . Allergy   . 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 CAD (coronary artery disease)   . Diabetes mellitus    type II  . Diabetic retinopathy    Dr Shea Stakes @ [facility] (vitrectomy left eye)  . Gastroenteritis 02/13/2013  . History of GI bleed    secondary to heparin  . Hypertension   . Right hip pain 02/13/2013  . Shingles   . Tubal pregnancy     Past Surgical History:  Procedure Laterality Date  . APPENDECTOMY    . CATARACT EXTRACTION Bilateral   . CORONARY ANGIOPLASTY  2010   heart stent x 1  . EYE SURGERY    . eye surgery for blood behind eye  left eye  . LAMINOTOMY     Dr Annette Stable L3-4  and microdiskectomy  . surgery for tubal pregnancy    . TONSILLECTOMY    . TUBAL LIGATION      Family History  Problem Relation Age of Onset  . Stroke Father   . Coronary artery disease Other   . Heart disease Mother    Social History:  reports that she has never smoked. She has never used smokeless tobacco. She reports that she does not drink alcohol or use drugs.  Allergies:  Allergies  Allergen Reactions  . Sulfa Antibiotics Hives  . Cyclobenzaprine Hcl Rash    No medications prior to admission.    No results found for this or any previous visit (from the past 48 hour(s)). No results found.  Review of Systems  Constitutional: Positive for diaphoresis.  HENT: Negative.   Respiratory: Positive for shortness of  breath.   Cardiovascular:       Htn  Gastrointestinal: Negative.   Endocrine:       Blood sugar problem  Genitourinary: Positive for frequency and urgency.  Musculoskeletal: Positive for arthralgias.  Skin: Negative.   Allergic/Immunologic: Negative.   Neurological: Negative.   Hematological: Negative.   Psychiatric/Behavioral: Negative.     Height 5\' 1"  (1.549 m), weight 94.3 kg. Physical Exam  Constitutional: She is oriented to person, place, and time. She appears well-developed and well-nourished.  HENT:  Head: Normocephalic and atraumatic.  Eyes: Pupils are equal, round, and reactive to light.  Cardiovascular: Intact distal pulses.  Respiratory: Effort normal.  Musculoskeletal:        General: Tenderness present.     Cervical back: Normal range of motion and neck supple.     Comments: Tender along the lateral joint line of the right knee trace effusion collateral ligaments are stable slight valgus angulation range of motion unchanged 0/115-120.  MRI scan is reviewed with the patient.  Neurological: She is alert and oriented to person, place, and time.  Skin: Skin is warm and dry.  Psychiatric: She has a normal mood and affect. Her behavior is normal. Judgment and thought content normal.  Assessment/Plan Assess: Symptomatic right knee lateral meniscal tear that is failed conservative treatment over the last 3 months including cortisone injection.  Plan: Risks and benefits of arthroscopic surgery discussed at length with the patient we will get this arranged for her at her convenience.  She should be a good candidate for the surgery center but we will need cardiology clearance as she had a stent placed some years ago.  Dannielle Burn, PA-C 10/07/2019, 10:11 AM

## 2019-10-08 ENCOUNTER — Other Ambulatory Visit (HOSPITAL_COMMUNITY)
Admission: RE | Admit: 2019-10-08 | Discharge: 2019-10-08 | Disposition: A | Discharge status: Home / Self Care | Payer: Medicare PPO | Source: Ambulatory Visit | Attending: Orthopedic Surgery | Admitting: Orthopedic Surgery

## 2019-10-08 DIAGNOSIS — Z01812 Encounter for preprocedural laboratory examination: Secondary | ICD-10-CM | POA: Diagnosis not present

## 2019-10-08 DIAGNOSIS — Z20822 Contact with and (suspected) exposure to covid-19: Secondary | ICD-10-CM | POA: Diagnosis not present

## 2019-10-08 LAB — SARS CORONAVIRUS 2 (TAT 6-24 HRS): SARS Coronavirus 2: NEGATIVE

## 2019-10-10 ENCOUNTER — Ambulatory Visit (HOSPITAL_BASED_OUTPATIENT_CLINIC_OR_DEPARTMENT_OTHER): Payer: Medicare PPO | Admitting: Physician Assistant

## 2019-10-10 ENCOUNTER — Encounter (HOSPITAL_BASED_OUTPATIENT_CLINIC_OR_DEPARTMENT_OTHER)
Admission: RE | Disposition: A | Discharge status: Home / Self Care | Payer: Self-pay | Source: Home / Self Care | Attending: Orthopedic Surgery

## 2019-10-10 ENCOUNTER — Other Ambulatory Visit: Payer: Self-pay

## 2019-10-10 ENCOUNTER — Ambulatory Visit (HOSPITAL_BASED_OUTPATIENT_CLINIC_OR_DEPARTMENT_OTHER)
Admission: RE | Admit: 2019-10-10 | Discharge: 2019-10-10 | Disposition: A | Discharge status: Home / Self Care | Payer: Medicare PPO | Attending: Orthopedic Surgery | Admitting: Orthopedic Surgery

## 2019-10-10 ENCOUNTER — Encounter (HOSPITAL_BASED_OUTPATIENT_CLINIC_OR_DEPARTMENT_OTHER): Payer: Self-pay | Admitting: Orthopedic Surgery

## 2019-10-10 DIAGNOSIS — I5033 Acute on chronic diastolic (congestive) heart failure: Secondary | ICD-10-CM | POA: Insufficient documentation

## 2019-10-10 DIAGNOSIS — E11319 Type 2 diabetes mellitus with unspecified diabetic retinopathy without macular edema: Secondary | ICD-10-CM | POA: Insufficient documentation

## 2019-10-10 DIAGNOSIS — X58XXXA Exposure to other specified factors, initial encounter: Secondary | ICD-10-CM | POA: Diagnosis not present

## 2019-10-10 DIAGNOSIS — Z79899 Other long term (current) drug therapy: Secondary | ICD-10-CM | POA: Diagnosis not present

## 2019-10-10 DIAGNOSIS — H5462 Unqualified visual loss, left eye, normal vision right eye: Secondary | ICD-10-CM | POA: Diagnosis not present

## 2019-10-10 DIAGNOSIS — Z7982 Long term (current) use of aspirin: Secondary | ICD-10-CM | POA: Diagnosis not present

## 2019-10-10 DIAGNOSIS — Z6841 Body Mass Index (BMI) 40.0 and over, adult: Secondary | ICD-10-CM | POA: Insufficient documentation

## 2019-10-10 DIAGNOSIS — Z7902 Long term (current) use of antithrombotics/antiplatelets: Secondary | ICD-10-CM | POA: Insufficient documentation

## 2019-10-10 DIAGNOSIS — Z955 Presence of coronary angioplasty implant and graft: Secondary | ICD-10-CM | POA: Diagnosis not present

## 2019-10-10 DIAGNOSIS — S83271A Complex tear of lateral meniscus, current injury, right knee, initial encounter: Secondary | ICD-10-CM | POA: Diagnosis not present

## 2019-10-10 DIAGNOSIS — S83281A Other tear of lateral meniscus, current injury, right knee, initial encounter: Secondary | ICD-10-CM | POA: Diagnosis present

## 2019-10-10 DIAGNOSIS — S83241A Other tear of medial meniscus, current injury, right knee, initial encounter: Secondary | ICD-10-CM | POA: Diagnosis not present

## 2019-10-10 DIAGNOSIS — I5043 Acute on chronic combined systolic (congestive) and diastolic (congestive) heart failure: Secondary | ICD-10-CM | POA: Diagnosis not present

## 2019-10-10 DIAGNOSIS — I2510 Atherosclerotic heart disease of native coronary artery without angina pectoris: Secondary | ICD-10-CM | POA: Insufficient documentation

## 2019-10-10 DIAGNOSIS — M2241 Chondromalacia patellae, right knee: Secondary | ICD-10-CM | POA: Insufficient documentation

## 2019-10-10 DIAGNOSIS — I110 Hypertensive heart disease with heart failure: Secondary | ICD-10-CM | POA: Insufficient documentation

## 2019-10-10 DIAGNOSIS — Z7984 Long term (current) use of oral hypoglycemic drugs: Secondary | ICD-10-CM | POA: Diagnosis not present

## 2019-10-10 DIAGNOSIS — M25561 Pain in right knee: Secondary | ICD-10-CM | POA: Diagnosis present

## 2019-10-10 HISTORY — PX: KNEE ARTHROSCOPY: SHX127

## 2019-10-10 HISTORY — DX: Blindness, one eye, low vision other eye, unspecified eyes: H54.10

## 2019-10-10 LAB — POCT I-STAT, CHEM 8
BUN: 24 mg/dL — ABNORMAL HIGH (ref 8–23)
Calcium, Ion: 1.23 mmol/L (ref 1.15–1.40)
Chloride: 107 mmol/L (ref 98–111)
Creatinine, Ser: 1 mg/dL (ref 0.44–1.00)
Glucose, Bld: 188 mg/dL — ABNORMAL HIGH (ref 70–99)
HCT: 50 % — ABNORMAL HIGH (ref 36.0–46.0)
Hemoglobin: 17 g/dL — ABNORMAL HIGH (ref 12.0–15.0)
Potassium: 4.1 mmol/L (ref 3.5–5.1)
Sodium: 143 mmol/L (ref 135–145)
TCO2: 24 mmol/L (ref 22–32)

## 2019-10-10 LAB — GLUCOSE, CAPILLARY: Glucose-Capillary: 179 mg/dL — ABNORMAL HIGH (ref 70–99)

## 2019-10-10 SURGERY — ARTHROSCOPY, KNEE
Anesthesia: General | Site: Knee | Laterality: Right

## 2019-10-10 MED ORDER — PHENYLEPHRINE HCL (PRESSORS) 10 MG/ML IV SOLN
INTRAVENOUS | Status: DC | PRN
  Administered 2019-10-10 (×2): 80 ug via INTRAVENOUS

## 2019-10-10 MED ORDER — ACETAMINOPHEN 500 MG PO TABS
ORAL_TABLET | ORAL | Status: AC
  Filled 2019-10-10: qty 2

## 2019-10-10 MED ORDER — FENTANYL CITRATE (PF) 100 MCG/2ML IJ SOLN
25.0000 ug | INTRAMUSCULAR | Status: DC | PRN
  Administered 2019-10-10 (×2): 25 ug via INTRAVENOUS
  Filled 2019-10-10: qty 1

## 2019-10-10 MED ORDER — ACETAMINOPHEN 500 MG PO TABS
1000.0000 mg | ORAL_TABLET | Freq: Once | ORAL | Status: AC
  Administered 2019-10-10: 1000 mg via ORAL
  Filled 2019-10-10: qty 2

## 2019-10-10 MED ORDER — CEFAZOLIN SODIUM-DEXTROSE 2-4 GM/100ML-% IV SOLN
INTRAVENOUS | Status: AC
  Filled 2019-10-10: qty 100

## 2019-10-10 MED ORDER — ONDANSETRON HCL 4 MG/2ML IJ SOLN
4.0000 mg | Freq: Four times a day (QID) | INTRAMUSCULAR | Status: DC | PRN
  Filled 2019-10-10: qty 2

## 2019-10-10 MED ORDER — FENTANYL CITRATE (PF) 100 MCG/2ML IJ SOLN
INTRAMUSCULAR | Status: DC | PRN
  Administered 2019-10-10: 25 ug via INTRAVENOUS

## 2019-10-10 MED ORDER — METOCLOPRAMIDE HCL 5 MG/ML IJ SOLN
5.0000 mg | Freq: Three times a day (TID) | INTRAMUSCULAR | Status: DC | PRN
  Filled 2019-10-10: qty 2

## 2019-10-10 MED ORDER — LACTATED RINGERS IV SOLN
INTRAVENOUS | Status: DC
  Filled 2019-10-10: qty 1000

## 2019-10-10 MED ORDER — ONDANSETRON HCL 4 MG PO TABS
4.0000 mg | ORAL_TABLET | Freq: Four times a day (QID) | ORAL | Status: DC | PRN
  Filled 2019-10-10: qty 1

## 2019-10-10 MED ORDER — FENTANYL CITRATE (PF) 100 MCG/2ML IJ SOLN
INTRAMUSCULAR | Status: AC
  Filled 2019-10-10: qty 2

## 2019-10-10 MED ORDER — HYDROCODONE-ACETAMINOPHEN 5-325 MG PO TABS: 1.0000 | ORAL_TABLET | Freq: Four times a day (QID) | ORAL | 0 refills | Status: DC | PRN

## 2019-10-10 MED ORDER — BUPIVACAINE-EPINEPHRINE 0.5% -1:200000 IJ SOLN
INTRAMUSCULAR | Status: DC | PRN
  Administered 2019-10-10: 20 mL

## 2019-10-10 MED ORDER — METOCLOPRAMIDE HCL 5 MG PO TABS
5.0000 mg | ORAL_TABLET | Freq: Three times a day (TID) | ORAL | Status: DC | PRN
  Filled 2019-10-10: qty 2

## 2019-10-10 MED ORDER — OXYCODONE HCL 5 MG PO TABS
ORAL_TABLET | ORAL | Status: AC
  Filled 2019-10-10: qty 1

## 2019-10-10 MED ORDER — LIDOCAINE 2% (20 MG/ML) 5 ML SYRINGE
INTRAMUSCULAR | Status: DC | PRN
  Administered 2019-10-10: 75 mg via INTRAVENOUS

## 2019-10-10 MED ORDER — CHLORHEXIDINE GLUCONATE 4 % EX LIQD
60.0000 mL | Freq: Once | CUTANEOUS | Status: DC
  Filled 2019-10-10: qty 118

## 2019-10-10 MED ORDER — CEFAZOLIN SODIUM-DEXTROSE 2-4 GM/100ML-% IV SOLN
2.0000 g | INTRAVENOUS | Status: AC
  Administered 2019-10-10: 10:00:00 2 g via INTRAVENOUS
  Filled 2019-10-10: qty 100

## 2019-10-10 MED ORDER — ONDANSETRON HCL 4 MG/2ML IJ SOLN
INTRAMUSCULAR | Status: DC | PRN
  Administered 2019-10-10: 4 mg via INTRAVENOUS

## 2019-10-10 MED ORDER — EPHEDRINE SULFATE 50 MG/ML IJ SOLN
INTRAMUSCULAR | Status: DC | PRN
  Administered 2019-10-10: 10 mg via INTRAVENOUS

## 2019-10-10 MED ORDER — SODIUM CHLORIDE 0.9 % IR SOLN
Status: DC | PRN
  Administered 2019-10-10 (×2): 3000 mL

## 2019-10-10 MED ORDER — LACTATED RINGERS IV SOLN
INTRAVENOUS | Status: DC
  Administered 2019-10-10: 09:00:00 50 mL/h via INTRAVENOUS
  Filled 2019-10-10: qty 1000

## 2019-10-10 MED ORDER — EPINEPHRINE PF 1 MG/ML IJ SOLN
INTRAMUSCULAR | Status: DC | PRN
  Administered 2019-10-10: 2 mg

## 2019-10-10 MED ORDER — OXYCODONE HCL 5 MG PO TABS
5.0000 mg | ORAL_TABLET | Freq: Once | ORAL | Status: AC
  Administered 2019-10-10: 12:00:00 5 mg via ORAL
  Filled 2019-10-10: qty 1

## 2019-10-10 MED ORDER — PROPOFOL 10 MG/ML IV BOLUS
INTRAVENOUS | Status: DC | PRN
  Administered 2019-10-10: 80 mg via INTRAVENOUS

## 2019-10-10 SURGICAL SUPPLY — 35 items
BNDG ELASTIC 6X5.8 VLCR STR LF (GAUZE/BANDAGES/DRESSINGS) ×3 IMPLANT
COVER WAND RF STERILE (DRAPES) ×3 IMPLANT
DRAPE ARTHROSCOPY W/POUCH 114 (DRAPES) ×3 IMPLANT
DURAPREP 26ML APPLICATOR (WOUND CARE) ×3 IMPLANT
ELECT REM PT RETURN 9FT ADLT (ELECTROSURGICAL)
ELECTRODE REM PT RTRN 9FT ADLT (ELECTROSURGICAL) IMPLANT
EXCALIBUR 3.8MM X 13CM (MISCELLANEOUS) ×3 IMPLANT
GAUZE SPONGE 4X4 12PLY STRL (GAUZE/BANDAGES/DRESSINGS) ×3 IMPLANT
GAUZE XEROFORM 1X8 LF (GAUZE/BANDAGES/DRESSINGS) ×3 IMPLANT
GLOVE BIO SURGEON STRL SZ8.5 (GLOVE) ×3 IMPLANT
GLOVE BIOGEL PI IND STRL 9 (GLOVE) ×1 IMPLANT
GLOVE BIOGEL PI INDICATOR 9 (GLOVE) ×2
GLOVE ECLIPSE 7.5 STRL STRAW (GLOVE) ×3 IMPLANT
GLOVE INDICATOR 8.0 STRL GRN (GLOVE) ×3 IMPLANT
GOWN STRL REUS W/TWL LRG LVL3 (GOWN DISPOSABLE) ×3 IMPLANT
GOWN STRL REUS W/TWL XL LVL3 (GOWN DISPOSABLE) ×3 IMPLANT
KIT TURNOVER CYSTO (KITS) ×3 IMPLANT
KNEE WRAP E Z 3 GEL PACK (MISCELLANEOUS) ×3 IMPLANT
MANIFOLD NEPTUNE II (INSTRUMENTS) ×3 IMPLANT
NDL FILTER BLUNT 18X1 1/2 (NEEDLE) ×1 IMPLANT
NDL SAFETY ECLIPSE 18X1.5 (NEEDLE) ×1 IMPLANT
NEEDLE FILTER BLUNT 18X 1/2SAF (NEEDLE) ×2
NEEDLE FILTER BLUNT 18X1 1/2 (NEEDLE) ×1 IMPLANT
NEEDLE HYPO 18GX1.5 SHARP (NEEDLE) ×3
PACK ARTHROSCOPY DSU (CUSTOM PROCEDURE TRAY) ×3 IMPLANT
PACK BASIN DAY SURGERY FS (CUSTOM PROCEDURE TRAY) ×3 IMPLANT
PADDING CAST COTTON 6X4 STRL (CAST SUPPLIES) ×3 IMPLANT
PORT APPOLLO RF 90DEGREE MULTI (SURGICAL WAND) IMPLANT
SYR 10ML LL (SYRINGE) IMPLANT
TOWEL OR 17X26 10 PK STRL BLUE (TOWEL DISPOSABLE) ×3 IMPLANT
TUBE CONNECTING 12'X1/4 (SUCTIONS)
TUBE CONNECTING 12X1/4 (SUCTIONS) IMPLANT
TUBING ARTHROSCOPY IRRIG 16FT (MISCELLANEOUS) ×3 IMPLANT
UNDERPAD 30X30 (UNDERPADS AND DIAPERS) IMPLANT
WATER STERILE IRR 500ML POUR (IV SOLUTION) ×3 IMPLANT

## 2019-10-10 NOTE — Op Note (Signed)
Pre-Op Dx: Right knee lateral meniscal tear  Postop Dx: Right knee lateral meniscal tear minor chondromalacia patella  Procedure: Right knee arthroscopic partial lateral meniscectomy debridement chondromalacia patella grade 2  Surgeon: Feliberto Gottron. Turner Daniels M.D.  Assist: Tomi Likens. Gaylene Brooks  (present throughout entire procedure and necessary for timely completion of the procedure) Anes: General LMA  EBL: Minimal  Fluids: 800 cc   Indications: Catching popping and pain in the right knee.  MRI scan shows complex tear lateral meniscus. Pt has failed conservative treatment with anti-inflammatory medicines, physical therapy, and modified activites but did get good temporarily from an intra-articular cortisone injection. Pain has recurred and patient desires elective arthroscopic evaluation and treatment of knee. Risks and benefits of surgery have been discussed and questions answered.  Procedure: Patient identified by arm band and taken to the operating room at the day surgery Center. The appropriate anesthetic monitors were attached, and General LMA anesthesia was induced without difficulty. Lateral post was applied to the table and the lower extremity was prepped and draped in usual sterile fashion from the ankle to the midthigh. Time out procedure was performed. We began the operation by making standard inferior lateral and inferior medial peripatellar portals with a #11 blade allowing introduction of the arthroscope through the inferior lateral portal and the out flow to the inferior medial portal. Pump pressure was set at 100 mmHg and diagnostic arthroscopy  revealed moderate chondromalacia the patellofemoral joint grade 2 of the patella lightly debrided with the Arthrex resector.  Moving of the medial compartment the articular and meniscal cartilages were in good condition there is a small amount of calcium in the cartilage lightly debrided.  The ACL and PCL are intact.  On the lateral side the patient had  a large tearing of the lateral meniscus complex this is debrided back to a stable margin with a resector and a small biter.  Interestingly the articular cartilage of the lateral side had some very minor fraying and overall was in good condition.. The knee was irrigated out normal saline solution. A dressing of xerofoam 4 x 4 dressing sponges, web roll and an Ace wrap was applied. The patient was awakened extubated and taken to the recovery without difficulty.    Signed: Nestor Lewandowsky, MD

## 2019-10-10 NOTE — Anesthesia Procedure Notes (Signed)
Procedure Name: LMA Insertion Date/Time: 10/10/2019 10:21 AM Performed by: Illene Silver, CRNA Pre-anesthesia Checklist: Patient identified, Emergency Drugs available, Suction available and Patient being monitored Patient Re-evaluated:Patient Re-evaluated prior to induction Oxygen Delivery Method: Circle system utilized Preoxygenation: Pre-oxygenation with 100% oxygen Induction Type: IV induction LMA: LMA inserted LMA Size: 4.0 Tube type: Oral Number of attempts: 1 Placement Confirmation: ETT inserted through vocal cords under direct vision,  positive ETCO2 and breath sounds checked- equal and bilateral Tube secured with: Tape Dental Injury: Teeth and Oropharynx as per pre-operative assessment

## 2019-10-10 NOTE — Transfer of Care (Signed)
Immediate Anesthesia Transfer of Care Note  Patient: Elizabeth [name]  Procedure(s) Performed: RIGHT KNEE ARTHROSCOPY (Right Knee)  Patient Location: PACU  Anesthesia Type:General  Level of Consciousness: awake, alert , oriented and patient cooperative  Airway & Oxygen Therapy: Patient Spontanous Breathing and Patient connected to face mask oxygen  Post-op Assessment: Report given to RN, Post -op Vital signs reviewed and stable and Patient moving all extremities X 4  Post vital signs: stable  Last Vitals:  Vitals Value Taken Time  BP 150/52 10/10/19 1130  Temp 36.4 C 10/10/19 1101  Pulse 50 10/10/19 1140  Resp 24 10/10/19 1140  SpO2 91 % 10/10/19 1140  Vitals shown include unvalidated device data.  Last Pain:  Vitals:   10/10/19 1120  TempSrc:   PainSc: 5       Patients Stated Pain Goal: 5 (10/10/19 1120)  Complications: No apparent anesthesia complications

## 2019-10-10 NOTE — Anesthesia Postprocedure Evaluation (Signed)
Anesthesia Post Note  Patient: Elizabeth [name]  Procedure(s) Performed: RIGHT KNEE ARTHROSCOPY (Right Knee)     Patient location during evaluation: PACU Anesthesia Type: General Level of consciousness: awake and alert Pain management: pain level controlled Vital Signs Assessment: post-procedure vital signs reviewed and stable Respiratory status: spontaneous breathing, nonlabored ventilation, respiratory function stable and patient connected to nasal cannula oxygen Cardiovascular status: blood pressure returned to baseline and stable Postop Assessment: no apparent nausea or vomiting Anesthetic complications: no    Last Vitals:  Vitals:   10/10/19 1130 10/10/19 1145  BP: (!) 150/52 (!) 162/50  Pulse: (!) 50   Resp: 16   Temp:    SpO2: 90%     Last Pain:  Vitals:   10/10/19 1145  TempSrc:   PainSc: 3                  Candas Deemer L Tremell Reimers

## 2019-10-10 NOTE — Discharge Instructions (Signed)
  Post Anesthesia Home Care Instructions  Activity: Get plenty of rest for the remainder of the day. A responsible individual must stay with you for 24 hours following the procedure.  For the next 24 hours, DO NOT: -Drive a car -Advertising copywriter -Drink alcoholic beverages -Take any medication unless instructed by your physician -Make any legal decisions or sign important papers.  Meals: Start with liquid foods such as gelatin or soup. Progress to regular foods as tolerated. Avoid greasy, spicy, heavy foods. If nausea and/or vomiting occur, drink only clear liquids until the nausea and/or vomiting subsides. Call your physician if vomiting continues.  Special Instructions/Symptoms: Your throat may feel dry or sore from the anesthesia or the breathing tube placed in your throat during surgery. If this causes discomfort, gargle with warm salt water. The discomfort should disappear within 24 hours.  Call your surgeon if you experience:   1.  Fever over 101.0. 2.  Inability to urinate. 3.  Nausea and/or vomiting. 4.  Extreme swelling or bruising at the surgical site. 5.  Continued bleeding from the incision. 6.  Increased pain, redness or drainage from the incision. 7.  Problems related to your pain medication. 8.  Any problems and/or concerns. 9. Any changes in sensation to right foot.

## 2019-10-10 NOTE — Interval H&P Note (Signed)
History and Physical Interval Note:  10/10/2019 10:07 AM  Elizabeth [name]  has presented today for surgery, with the diagnosis of RIGHT KNEE MEDIAL MENISCUS TEAR.  The various methods of treatment have been discussed with the patient and family. After consideration of risks, benefits and other options for treatment, the patient has consented to  Procedure(s): RIGHT KNEE ARTHROSCOPY (Right) as a surgical intervention.  The patient's history has been reviewed, patient examined, no change in status, stable for surgery.  I have reviewed the patient's chart and labs.  Questions were answered to the patient's satisfaction.     Nestor Lewandowsky

## 2019-10-10 NOTE — Progress Notes (Signed)
Spoke w pt via phone.  Pt informed that a denture cup w her name on it is at the nsg station.  Pt provided w [facility] phone number if she was able to send someone to get them. Otherwise someone would be at the front desk in am @ 0500.  Pt verbalized her understanding.

## 2019-10-10 NOTE — Anesthesia Preprocedure Evaluation (Addendum)
Anesthesia Evaluation  Patient identified by MRN, date of birth, ID band Patient awake    Reviewed: Allergy & Precautions, NPO status , Patient's Chart, lab work & pertinent test results, reviewed documented beta blocker date and time   Airway Mallampati: II  TM Distance: >3 FB Neck ROM: Full    Dental  (+) Partial Lower, Dental Advisory Given   Pulmonary neg pulmonary ROS,    Pulmonary exam normal breath sounds clear to auscultation       Cardiovascular hypertension, Pt. on home beta blockers and Pt. on medications + CAD, + Past MI, + Cardiac Stents (~10 years ago) and +CHF  Normal cardiovascular exam Rhythm:Regular Rate:Normal  TTE 2018 Normal LVEF, G1DD, mild TR   Neuro/Psych negative neurological ROS  negative psych ROS   GI/Hepatic negative GI ROS, Neg liver ROS,   Endo/Other  diabetes, Oral Hypoglycemic AgentsMorbid obesity (BMI 42)  Renal/GU negative Renal ROS  negative genitourinary   Musculoskeletal negative musculoskeletal ROS (+)   Abdominal   Peds  Hematology  (+) Blood dyscrasia (on plavix, last dose 10/02/19), ,   Anesthesia Other Findings   Reproductive/Obstetrics                           Anesthesia Physical Anesthesia Plan  ASA: III  Anesthesia Plan: General   Post-op Pain Management:    Induction: Intravenous  PONV Risk Score and Plan: 3 and Ondansetron, Dexamethasone and Treatment may vary due to age or medical condition  Airway Management Planned: LMA  Additional Equipment:   Intra-op Plan:   Post-operative Plan: Extubation in OR  Informed Consent: I have reviewed the patients History and Physical, chart, labs and discussed the procedure including the risks, benefits and alternatives for the proposed anesthesia with the patient or authorized representative who has indicated his/her understanding and acceptance.     Dental advisory given  Plan Discussed  with: CRNA  Anesthesia Plan Comments:         Anesthesia Quick Evaluation

## 2019-10-18 ENCOUNTER — Other Ambulatory Visit: Payer: Medicare PPO

## 2019-10-20 DIAGNOSIS — M25661 Stiffness of right knee, not elsewhere classified: Secondary | ICD-10-CM | POA: Diagnosis not present

## 2019-10-20 DIAGNOSIS — S83241D Other tear of medial meniscus, current injury, right knee, subsequent encounter: Secondary | ICD-10-CM | POA: Diagnosis not present

## 2019-10-20 DIAGNOSIS — M6281 Muscle weakness (generalized): Secondary | ICD-10-CM | POA: Diagnosis not present

## 2019-10-24 DIAGNOSIS — S83241D Other tear of medial meniscus, current injury, right knee, subsequent encounter: Secondary | ICD-10-CM | POA: Diagnosis not present

## 2019-10-24 DIAGNOSIS — M6281 Muscle weakness (generalized): Secondary | ICD-10-CM | POA: Diagnosis not present

## 2019-10-24 DIAGNOSIS — M25662 Stiffness of left knee, not elsewhere classified: Secondary | ICD-10-CM | POA: Diagnosis not present

## 2019-10-27 DIAGNOSIS — M6281 Muscle weakness (generalized): Secondary | ICD-10-CM | POA: Diagnosis not present

## 2019-10-27 DIAGNOSIS — M25661 Stiffness of right knee, not elsewhere classified: Secondary | ICD-10-CM | POA: Diagnosis not present

## 2019-10-27 DIAGNOSIS — S83241D Other tear of medial meniscus, current injury, right knee, subsequent encounter: Secondary | ICD-10-CM | POA: Diagnosis not present

## 2019-11-01 ENCOUNTER — Other Ambulatory Visit: Payer: Self-pay | Admitting: Family Medicine

## 2019-11-01 ENCOUNTER — Other Ambulatory Visit: Payer: Medicare PPO

## 2019-11-01 DIAGNOSIS — E11319 Type 2 diabetes mellitus with unspecified diabetic retinopathy without macular edema: Secondary | ICD-10-CM

## 2019-11-01 DIAGNOSIS — M6281 Muscle weakness (generalized): Secondary | ICD-10-CM | POA: Diagnosis not present

## 2019-11-01 DIAGNOSIS — S83241D Other tear of medial meniscus, current injury, right knee, subsequent encounter: Secondary | ICD-10-CM | POA: Diagnosis not present

## 2019-11-01 DIAGNOSIS — M25661 Stiffness of right knee, not elsewhere classified: Secondary | ICD-10-CM | POA: Diagnosis not present

## 2019-11-01 MED ORDER — CANAGLIFLOZIN 300 MG PO TABS: 300.0000 mg | ORAL_TABLET | Freq: Every day | ORAL | 5 refills | Status: DC

## 2019-11-03 DIAGNOSIS — M6281 Muscle weakness (generalized): Secondary | ICD-10-CM | POA: Diagnosis not present

## 2019-11-03 DIAGNOSIS — S83241D Other tear of medial meniscus, current injury, right knee, subsequent encounter: Secondary | ICD-10-CM | POA: Diagnosis not present

## 2019-11-03 DIAGNOSIS — M25661 Stiffness of right knee, not elsewhere classified: Secondary | ICD-10-CM | POA: Diagnosis not present

## 2019-11-07 ENCOUNTER — Ambulatory Visit: Payer: Medicare PPO | Admitting: Family Medicine

## 2019-11-08 DIAGNOSIS — S83241D Other tear of medial meniscus, current injury, right knee, subsequent encounter: Secondary | ICD-10-CM | POA: Diagnosis not present

## 2019-11-08 DIAGNOSIS — M25661 Stiffness of right knee, not elsewhere classified: Secondary | ICD-10-CM | POA: Diagnosis not present

## 2019-11-08 DIAGNOSIS — M6281 Muscle weakness (generalized): Secondary | ICD-10-CM | POA: Diagnosis not present

## 2019-11-10 DIAGNOSIS — M6281 Muscle weakness (generalized): Secondary | ICD-10-CM | POA: Diagnosis not present

## 2019-11-10 DIAGNOSIS — M25661 Stiffness of right knee, not elsewhere classified: Secondary | ICD-10-CM | POA: Diagnosis not present

## 2019-11-10 DIAGNOSIS — S83241D Other tear of medial meniscus, current injury, right knee, subsequent encounter: Secondary | ICD-10-CM | POA: Diagnosis not present

## 2019-11-15 DIAGNOSIS — L2089 Other atopic dermatitis: Secondary | ICD-10-CM | POA: Diagnosis not present

## 2019-11-15 DIAGNOSIS — L821 Other seborrheic keratosis: Secondary | ICD-10-CM | POA: Diagnosis not present

## 2019-11-15 DIAGNOSIS — L281 Prurigo nodularis: Secondary | ICD-10-CM | POA: Diagnosis not present

## 2019-11-15 DIAGNOSIS — D1801 Hemangioma of skin and subcutaneous tissue: Secondary | ICD-10-CM | POA: Diagnosis not present

## 2019-11-15 DIAGNOSIS — I781 Nevus, non-neoplastic: Secondary | ICD-10-CM | POA: Diagnosis not present

## 2019-11-16 DIAGNOSIS — M6281 Muscle weakness (generalized): Secondary | ICD-10-CM | POA: Diagnosis not present

## 2019-11-16 DIAGNOSIS — S83241D Other tear of medial meniscus, current injury, right knee, subsequent encounter: Secondary | ICD-10-CM | POA: Diagnosis not present

## 2019-11-16 DIAGNOSIS — M25661 Stiffness of right knee, not elsewhere classified: Secondary | ICD-10-CM | POA: Diagnosis not present

## 2019-11-21 DIAGNOSIS — M6281 Muscle weakness (generalized): Secondary | ICD-10-CM | POA: Diagnosis not present

## 2019-11-21 DIAGNOSIS — S83241D Other tear of medial meniscus, current injury, right knee, subsequent encounter: Secondary | ICD-10-CM | POA: Diagnosis not present

## 2019-11-21 DIAGNOSIS — M25661 Stiffness of right knee, not elsewhere classified: Secondary | ICD-10-CM | POA: Diagnosis not present

## 2019-11-23 ENCOUNTER — Other Ambulatory Visit: Payer: Medicare PPO

## 2019-11-28 ENCOUNTER — Other Ambulatory Visit: Payer: Self-pay | Admitting: Family Medicine

## 2019-11-30 ENCOUNTER — Ambulatory Visit: Payer: Medicare PPO | Admitting: Family Medicine

## 2019-12-14 ENCOUNTER — Other Ambulatory Visit: Payer: Self-pay

## 2019-12-14 ENCOUNTER — Encounter: Payer: Self-pay | Admitting: Family Medicine

## 2019-12-14 ENCOUNTER — Ambulatory Visit: Payer: Medicare PPO | Admitting: Family Medicine

## 2019-12-14 VITALS — BP 138/80 | HR 73 | Temp 97.0°F

## 2019-12-14 DIAGNOSIS — Z634 Disappearance and death of family member: Secondary | ICD-10-CM

## 2019-12-14 DIAGNOSIS — I5022 Chronic systolic (congestive) heart failure: Secondary | ICD-10-CM

## 2019-12-14 MED ORDER — SERTRALINE HCL 50 MG PO TABS: 50.0000 mg | ORAL_TABLET | Freq: Every day | ORAL | 3 refills | Status: DC

## 2019-12-14 MED ORDER — FUROSEMIDE 40 MG PO TABS: ORAL_TABLET | ORAL | 1 refills | Status: AC

## 2019-12-14 MED ORDER — SITAGLIPTIN PHOSPHATE 100 MG PO TABS: 100.0000 mg | ORAL_TABLET | Freq: Every day | ORAL | 6 refills | Status: DC

## 2019-12-14 NOTE — Progress Notes (Signed)
Chief Complaint  Patient presents with  . Follow-up    son passed away 22 days ago    Subjective: Patient is a 85 y.o. female here for f/u. Here w granddaughter.  22 days ago her son suddenly passed away in his office from a massive heart attack.  Since that time, she has been very sad, crying a lot, anxious.  Her daughters thought she should be seen to discuss next next steps.  She is not following with a bereavement counselor or therapist.  She is trying to get outside and stay as active as possible.  She is able to complete her essential ADLs.  Denies any homicidal or suicidal ideation.  No self-medication.  Patient has a history of CHF.  She takes Lasix daily but is wondering if she still needs to do this after swelling is well controlled.  She has never taken on an as-needed basis.  Reports diet is healthy overall.  Past Medical History:  Diagnosis Date  . Allergy   . 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 CAD (coronary artery disease)   . Diabetes mellitus    type II  . Diabetic retinopathy    Dr Perley Jain @ [facility] (vitrectomy left eye)  . Gastroenteritis 02/13/2013  . History of GI bleed    secondary to heparin  . Hypertension   . Right hip pain 02/13/2013  . Shingles   . Tubal pregnancy     Objective: BP 138/80 (BP Location: Left Arm, Patient Position: Sitting, Cuff Size: Normal)   Pulse 73   Temp (!) 97 F (36.1 C) (Temporal)   SpO2 96%  General: Awake, appears stated age Lungs: No accessory muscle use Psych: Age appropriate judgment and insight, normal affect, depressed mood, tearful at times  Assessment and Plan: Bereavement - Plan: sertraline (ZOLOFT) 50 MG tablet, Ambulatory referral to Psychology  Chronic systolic congestive heart failure, NYHA class 1 (HCC) - Plan: furosemide (LASIX) 40 MG tablet  1-new, uncontrolled; refer to psychology team, start SSRI if she decides.  Counseled on exercise.  Anxiety coping techniques provided.   Counseled on the above diagnosis and the prognosis. 2-okay to take Lasix as needed. Follow-up as originally scheduled in 1 month. The patient and her family member voiced understanding and agreement to the plan.  Jilda Roche [place], DO 12/14/19  2:38 PM

## 2019-12-14 NOTE — Patient Instructions (Signed)
If you do not hear anything about your referral in the next 1-2 weeks, call our office and ask for an update.  Please consider counseling. Contact [phone] to schedule an appointment or inquire about cost/insurance coverage.  Stay active.   Coping skills Choose 5 that work for you:  Take a deep breath  Count to 20  Read a book  Do a puzzle  Meditate  Bake  Sing  Knit  Garden  Pray  Go outside  Call a friend  Listen to music  Take a walk  Color  Send a note  Take a bath  Watch a movie  Be alone in a quiet place  Pet an animal  Visit a friend  Journal  Exercise  Stretch   Let us know if you need anything.

## 2019-12-16 ENCOUNTER — Ambulatory Visit: Payer: Medicare PPO | Admitting: Family Medicine

## 2019-12-21 ENCOUNTER — Other Ambulatory Visit: Payer: Medicare PPO

## 2020-01-04 ENCOUNTER — Ambulatory Visit: Payer: Medicare PPO | Admitting: Psychologist

## 2020-01-04 ENCOUNTER — Other Ambulatory Visit: Payer: Medicare PPO

## 2020-01-09 ENCOUNTER — Ambulatory Visit: Payer: Medicare PPO | Admitting: Family Medicine

## 2020-01-18 ENCOUNTER — Ambulatory Visit: Payer: Medicare PPO | Admitting: Psychologist

## 2020-01-19 ENCOUNTER — Other Ambulatory Visit: Payer: Medicare PPO

## 2020-01-24 ENCOUNTER — Ambulatory Visit: Payer: Medicare PPO | Admitting: Family Medicine

## 2020-02-07 ENCOUNTER — Other Ambulatory Visit: Payer: Self-pay

## 2020-02-07 ENCOUNTER — Other Ambulatory Visit (INDEPENDENT_AMBULATORY_CARE_PROVIDER_SITE_OTHER): Payer: Medicare PPO

## 2020-02-07 DIAGNOSIS — E875 Hyperkalemia: Secondary | ICD-10-CM | POA: Diagnosis not present

## 2020-02-07 DIAGNOSIS — D508 Other iron deficiency anemias: Secondary | ICD-10-CM

## 2020-02-07 LAB — CBC
HCT: 45 % (ref 36.0–46.0)
Hemoglobin: 14.7 g/dL (ref 12.0–15.0)
MCHC: 32.7 g/dL (ref 30.0–36.0)
MCV: 95.9 fl (ref 78.0–100.0)
Platelets: 258 10*3/uL (ref 150.0–400.0)
RBC: 4.69 Mil/uL (ref 3.87–5.11)
RDW: 14.1 % (ref 11.5–15.5)
WBC: 7.4 10*3/uL (ref 4.0–10.5)

## 2020-02-07 LAB — COMPREHENSIVE METABOLIC PANEL
ALT: 13 U/L (ref 0–35)
AST: 13 U/L (ref 0–37)
Albumin: 3.8 g/dL (ref 3.5–5.2)
Alkaline Phosphatase: 73 U/L (ref 39–117)
BUN: 21 mg/dL (ref 6–23)
CO2: 29 mEq/L (ref 19–32)
Calcium: 9.2 mg/dL (ref 8.4–10.5)
Chloride: 106 mEq/L (ref 96–112)
Creatinine, Ser: 1.06 mg/dL (ref 0.40–1.20)
GFR: 49.53 mL/min — ABNORMAL LOW (ref >60.00)
Glucose, Bld: 165 mg/dL — ABNORMAL HIGH (ref 70–99)
Potassium: 5.2 mEq/L — ABNORMAL HIGH (ref 3.5–5.1)
Sodium: 141 mEq/L (ref 135–145)
Total Bilirubin: 0.6 mg/dL (ref 0.2–1.2)
Total Protein: 6 g/dL (ref 6.0–8.3)

## 2020-02-15 ENCOUNTER — Ambulatory Visit: Payer: Medicare PPO | Admitting: Family Medicine

## 2020-02-15 ENCOUNTER — Other Ambulatory Visit: Payer: Self-pay | Admitting: Family Medicine

## 2020-02-15 ENCOUNTER — Other Ambulatory Visit: Payer: Self-pay

## 2020-02-15 ENCOUNTER — Encounter: Payer: Self-pay | Admitting: Family Medicine

## 2020-02-15 VITALS — BP 138/82 | HR 75 | Temp 97.0°F | Ht 61.0 in | Wt 219.0 lb

## 2020-02-15 DIAGNOSIS — Z634 Disappearance and death of family member: Secondary | ICD-10-CM | POA: Diagnosis not present

## 2020-02-15 DIAGNOSIS — E669 Obesity, unspecified: Secondary | ICD-10-CM | POA: Diagnosis not present

## 2020-02-15 DIAGNOSIS — E119 Type 2 diabetes mellitus without complications: Secondary | ICD-10-CM

## 2020-02-15 DIAGNOSIS — E1169 Type 2 diabetes mellitus with other specified complication: Secondary | ICD-10-CM | POA: Diagnosis not present

## 2020-02-15 LAB — LIPID PANEL
Cholesterol: 107 mg/dL (ref 0–200)
HDL: 40.6 mg/dL (ref >39.00)
LDL Cholesterol: 50 mg/dL (ref 0–99)
NonHDL: 65.95
Total CHOL/HDL Ratio: 3
Triglycerides: 79 mg/dL (ref 0.0–149.0)
VLDL: 15.8 mg/dL (ref 0.0–40.0)

## 2020-02-15 LAB — COMPREHENSIVE METABOLIC PANEL
ALT: 13 U/L (ref 0–35)
AST: 13 U/L (ref 0–37)
Albumin: 3.7 g/dL (ref 3.5–5.2)
Alkaline Phosphatase: 66 U/L (ref 39–117)
BUN: 16 mg/dL (ref 6–23)
CO2: 28 mEq/L (ref 19–32)
Calcium: 8.9 mg/dL (ref 8.4–10.5)
Chloride: 105 mEq/L (ref 96–112)
Creatinine, Ser: 1.01 mg/dL (ref 0.40–1.20)
GFR: 52.37 mL/min — ABNORMAL LOW (ref >60.00)
Glucose, Bld: 168 mg/dL — ABNORMAL HIGH (ref 70–99)
Potassium: 4.4 mEq/L (ref 3.5–5.1)
Sodium: 139 mEq/L (ref 135–145)
Total Bilirubin: 0.6 mg/dL (ref 0.2–1.2)
Total Protein: 5.9 g/dL — ABNORMAL LOW (ref 6.0–8.3)

## 2020-02-15 LAB — HEMOGLOBIN A1C: Hgb A1c MFr Bld: 9.5 % — ABNORMAL HIGH (ref 4.6–6.5)

## 2020-02-15 MED ORDER — SERTRALINE HCL 50 MG PO TABS: 50.0000 mg | ORAL_TABLET | Freq: Every day | ORAL | 2 refills | Status: DC

## 2020-02-15 MED ORDER — PIOGLITAZONE HCL 30 MG PO TABS: 30.0000 mg | ORAL_TABLET | Freq: Every day | ORAL | 3 refills | Status: DC

## 2020-02-15 NOTE — Progress Notes (Signed)
Subjective:   Chief Complaint  Patient presents with  . Follow-up    Elizabeth [name] is a 85 y.o. female here for follow-up of diabetes.   Jaylen has not been checking her sugars.  Patient does not require insulin.   Medications include: Januvia 100 mg daily, metoprolol XR 500 mg daily, Invokana 300 mg daily Diet is fair.  Exercise: None  Patient is also following up for bereavement.  Her son passed away a couple months ago.  She is better overall.  She is following with the counseling team through her church.  She is also taking Zoloft 50 mg daily.  She is compliant and reports no adverse effects.  She thinks it is helping a little bit.  No homicidal or suicidal ideation.  No self-medication.  Past Medical History:  Diagnosis Date  . Allergy   . 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 CAD (coronary artery disease)   . Diabetes mellitus    type II  . Diabetic retinopathy    Dr Perley Jain @ [facility] (vitrectomy left eye)  . Gastroenteritis 02/13/2013  . History of GI bleed    secondary to heparin  . Hypertension   . Right hip pain 02/13/2013  . Shingles   . Tubal pregnancy      Objective:  BP 138/82 (BP Location: Left Arm, Patient Position: Sitting, Cuff Size: Normal)   Pulse 75   Temp (!) 97 F (36.1 C) (Temporal)   Ht 5\' 1"  (1.549 m)   Wt 219 lb (99.3 kg)   SpO2 97%   BMI 41.38 kg/m  General:  Well developed, well nourished, in no apparent distress Lungs:  no access msc use Psych: Age appropriate judgment and insight  Assessment:   Bereavement - Plan: sertraline (ZOLOFT) 50 MG tablet  Diabetes mellitus type 2 in obese (HCC) - Plan: Comprehensive metabolic panel, Lipid panel, Hemoglobin A1c   Plan:   1.  Continue Zoloft.  Continue with the counseling.  If she decides to stop in future, she will take half a tab daily for 2 weeks and then stop. Counseled on diet and exercise. 2.  Check labs. F/u pending the above. The patient voiced  understanding and agreement to the plan.  [place], DO 02/15/20 12:10 PM

## 2020-02-15 NOTE — Patient Instructions (Addendum)
Continue with the counseling.  Let's continue with the medication. Stay on it until you don't feel like you need it anymore. When that time comes, take 1/2 tab daily for 2 weeks and then stop. Send me a message when this happens and/or if you have any questions/concerns.   Aim to do some physical exertion for 150 minutes per week. This is typically divided into 5 days per week, 30 minutes per day. The activity should be enough to get your heart rate up. Anything is better than nothing if you have time constraints.  Keep checking your sugars at home intermittently.   Give [place] 2-3 business days to get the results of your labs back.   Let us know if you need anything.

## 2020-03-20 ENCOUNTER — Ambulatory Visit: Payer: Medicare PPO | Admitting: Family Medicine

## 2020-04-16 ENCOUNTER — Other Ambulatory Visit: Payer: Self-pay | Admitting: Family Medicine

## 2020-04-16 DIAGNOSIS — E11319 Type 2 diabetes mellitus with unspecified diabetic retinopathy without macular edema: Secondary | ICD-10-CM

## 2020-05-07 ENCOUNTER — Other Ambulatory Visit: Payer: Self-pay | Admitting: Family Medicine

## 2020-05-09 ENCOUNTER — Other Ambulatory Visit: Payer: Self-pay | Admitting: Family Medicine

## 2020-05-09 DIAGNOSIS — E11319 Type 2 diabetes mellitus with unspecified diabetic retinopathy without macular edema: Secondary | ICD-10-CM

## 2020-05-18 ENCOUNTER — Ambulatory Visit: Payer: Medicare PPO | Admitting: Family Medicine

## 2020-06-16 ENCOUNTER — Ambulatory Visit: Payer: Medicare PPO | Attending: Internal Medicine

## 2020-06-16 DIAGNOSIS — Z23 Encounter for immunization: Secondary | ICD-10-CM

## 2020-06-16 NOTE — Progress Notes (Signed)
   Covid-19 Vaccination Clinic  Name:  ZILLAH ALEXIE    MRN: [id number] DOB: Jan 04, 1935  06/16/2020  Ms. Washburn was observed post Covid-19 immunization for 15 minutes without incident. She was provided with Vaccine Information Sheet and instruction to access the V-Safe system.   Ms. Hardeman was instructed to call 911 with any severe reactions post vaccine: Marland Kitchen Difficulty breathing  . Swelling of face and throat  . A fast heartbeat  . A bad rash all over body  . Dizziness and weakness

## 2020-07-03 ENCOUNTER — Ambulatory Visit: Payer: Medicare PPO | Admitting: Family Medicine

## 2020-07-09 ENCOUNTER — Other Ambulatory Visit: Payer: Self-pay | Admitting: Family Medicine

## 2020-08-14 ENCOUNTER — Ambulatory Visit: Payer: Medicare PPO | Admitting: Family Medicine

## 2020-09-18 ENCOUNTER — Ambulatory Visit: Payer: Medicare PPO | Admitting: Family Medicine

## 2020-09-18 DIAGNOSIS — M25562 Pain in left knee: Secondary | ICD-10-CM | POA: Diagnosis not present

## 2020-09-18 DIAGNOSIS — M25552 Pain in left hip: Secondary | ICD-10-CM | POA: Diagnosis not present

## 2020-10-04 DIAGNOSIS — H3341 Traction detachment of retina, right eye: Secondary | ICD-10-CM | POA: Diagnosis not present

## 2020-10-04 DIAGNOSIS — H547 Unspecified visual loss: Secondary | ICD-10-CM | POA: Diagnosis not present

## 2020-10-04 DIAGNOSIS — E113593 Type 2 diabetes mellitus with proliferative diabetic retinopathy without macular edema, bilateral: Secondary | ICD-10-CM | POA: Diagnosis not present

## 2020-10-04 DIAGNOSIS — H3589 Other specified retinal disorders: Secondary | ICD-10-CM | POA: Diagnosis not present

## 2020-10-04 DIAGNOSIS — H540X33 Blindness right eye category 3, blindness left eye category 3: Secondary | ICD-10-CM | POA: Diagnosis not present

## 2020-10-10 ENCOUNTER — Ambulatory Visit: Payer: Medicare PPO | Admitting: Family Medicine

## 2020-10-15 ENCOUNTER — Other Ambulatory Visit: Payer: Self-pay | Admitting: Family Medicine

## 2020-10-15 DIAGNOSIS — E11319 Type 2 diabetes mellitus with unspecified diabetic retinopathy without macular edema: Secondary | ICD-10-CM

## 2020-11-04 ENCOUNTER — Other Ambulatory Visit: Payer: Self-pay | Admitting: Family Medicine

## 2020-11-04 DIAGNOSIS — E11319 Type 2 diabetes mellitus with unspecified diabetic retinopathy without macular edema: Secondary | ICD-10-CM

## 2020-11-13 DIAGNOSIS — L821 Other seborrheic keratosis: Secondary | ICD-10-CM | POA: Diagnosis not present

## 2020-11-13 DIAGNOSIS — L578 Other skin changes due to chronic exposure to nonionizing radiation: Secondary | ICD-10-CM | POA: Diagnosis not present

## 2020-11-13 DIAGNOSIS — L905 Scar conditions and fibrosis of skin: Secondary | ICD-10-CM | POA: Diagnosis not present

## 2020-11-13 DIAGNOSIS — L989 Disorder of the skin and subcutaneous tissue, unspecified: Secondary | ICD-10-CM | POA: Diagnosis not present

## 2020-11-13 DIAGNOSIS — I781 Nevus, non-neoplastic: Secondary | ICD-10-CM | POA: Diagnosis not present

## 2020-11-13 DIAGNOSIS — D1801 Hemangioma of skin and subcutaneous tissue: Secondary | ICD-10-CM | POA: Diagnosis not present

## 2020-11-13 DIAGNOSIS — Z85828 Personal history of other malignant neoplasm of skin: Secondary | ICD-10-CM | POA: Diagnosis not present

## 2020-11-13 DIAGNOSIS — L2089 Other atopic dermatitis: Secondary | ICD-10-CM | POA: Diagnosis not present

## 2020-11-13 DIAGNOSIS — L304 Erythema intertrigo: Secondary | ICD-10-CM | POA: Diagnosis not present

## 2020-11-14 ENCOUNTER — Ambulatory Visit: Payer: Medicare PPO | Admitting: Family Medicine

## 2020-11-28 ENCOUNTER — Ambulatory Visit: Payer: Medicare PPO | Admitting: Family Medicine

## 2020-11-28 ENCOUNTER — Other Ambulatory Visit: Payer: Self-pay

## 2020-11-28 ENCOUNTER — Encounter: Payer: Self-pay | Admitting: Family Medicine

## 2020-11-28 VITALS — BP 126/70 | HR 66 | Temp 98.0°F | Ht 61.0 in | Wt 223.5 lb

## 2020-11-28 DIAGNOSIS — I5022 Chronic systolic (congestive) heart failure: Secondary | ICD-10-CM

## 2020-11-28 DIAGNOSIS — E669 Obesity, unspecified: Secondary | ICD-10-CM

## 2020-11-28 DIAGNOSIS — E1169 Type 2 diabetes mellitus with other specified complication: Secondary | ICD-10-CM | POA: Diagnosis not present

## 2020-11-28 DIAGNOSIS — I25118 Atherosclerotic heart disease of native coronary artery with other forms of angina pectoris: Secondary | ICD-10-CM | POA: Diagnosis not present

## 2020-11-28 LAB — COMPREHENSIVE METABOLIC PANEL
ALT: 18 U/L (ref 0–35)
AST: 15 U/L (ref 0–37)
Albumin: 3.6 g/dL (ref 3.5–5.2)
Alkaline Phosphatase: 72 U/L (ref 39–117)
BUN: 20 mg/dL (ref 6–23)
CO2: 30 mEq/L (ref 19–32)
Calcium: 9.2 mg/dL (ref 8.4–10.5)
Chloride: 105 mEq/L (ref 96–112)
Creatinine, Ser: 1.12 mg/dL (ref 0.40–1.20)
GFR: 45.45 mL/min — ABNORMAL LOW (ref >60.00)
Glucose, Bld: 159 mg/dL — ABNORMAL HIGH (ref 70–99)
Potassium: 5.4 mEq/L — ABNORMAL HIGH (ref 3.5–5.1)
Sodium: 141 mEq/L (ref 135–145)
Total Bilirubin: 0.8 mg/dL (ref 0.2–1.2)
Total Protein: 6.2 g/dL (ref 6.0–8.3)

## 2020-11-28 LAB — LIPID PANEL
Cholesterol: 117 mg/dL (ref 0–200)
HDL: 44.1 mg/dL (ref >39.00)
LDL Cholesterol: 55 mg/dL (ref 0–99)
NonHDL: 73
Total CHOL/HDL Ratio: 3
Triglycerides: 89 mg/dL (ref 0.0–149.0)
VLDL: 17.8 mg/dL (ref 0.0–40.0)

## 2020-11-28 LAB — HEMOGLOBIN A1C: Hgb A1c MFr Bld: 9.5 % — ABNORMAL HIGH (ref 4.6–6.5)

## 2020-11-28 LAB — MICROALBUMIN / CREATININE URINE RATIO
Creatinine,U: 43.9 mg/dL
Microalb Creat Ratio: 9.3 mg/g (ref 0.0–30.0)
Microalb, Ur: 4.1 mg/dL — ABNORMAL HIGH (ref 0.0–1.9)

## 2020-11-28 NOTE — Patient Instructions (Signed)
Give us 2-3 business days to get the results of your labs back.   Keep the diet clean and stay active.  Let us know if you need anything. 

## 2020-11-28 NOTE — Progress Notes (Signed)
Subjective:   Chief Complaint  Patient presents with  . Follow-up    Elizabeth [name] is a 86 y.o. female here for follow-up of diabetes.   [name] does not routinely check her sugars.  Patient does not require insulin.   Medications include: Januvia 100 mg/d, Invokana 300 mg/d, metformin XR 500 mg/d, Actos 30 mg/d; usually compliant Diet is fair, could be better.  Exercise: none No CP or SOB.   Hyperlipidemia Patient presents for dyslipidemia follow up. Currently being treated with Lipitor 40 mg/d and compliance with treatment thus far has been good. She denies myalgias. Diet/exercise as above.  The patient is known to have coexisting coronary artery disease.  Past Medical History:  Diagnosis Date  . Allergy   . 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 CAD (coronary artery disease)   . Diabetes mellitus    type II  . Diabetic retinopathy    Dr Perley Jain @ [facility] (vitrectomy left eye)  . Gastroenteritis 02/13/2013  . History of GI bleed    secondary to heparin  . Hypertension   . Right hip pain 02/13/2013  . Shingles   . Tubal pregnancy      Related testing: Retinal exam: Done Pneumovax: done  Objective:  BP 126/70 (BP Location: Left Arm, Patient Position: Sitting, Cuff Size: Normal)   Pulse 66   Temp 98 F (36.7 C) (Oral)   Ht 5\' 1"  (1.549 m)   Wt 223 lb 8 oz (101.4 kg)   SpO2 99%   BMI 42.23 kg/m  General:  Well developed, well nourished, in no apparent distress Skin:  Warm, no pallor or diaphoresis Head:  Normocephalic, atraumatic Eyes:  Pupils equal and round, sclera anicteric without injection  Lungs:  CTAB, no access msc use Cardio:  RRR, no bruits, 2+ b/l pitting LE edema Musculoskeletal:  Symmetrical muscle groups noted without atrophy or deformity Neuro:  Sensation intact to pinprick on feet Psych: Age appropriate judgment and insight  Assessment:   Diabetes mellitus type 2 in obese (HCC) - Plan: Hemoglobin A1c, Lipid panel,  Microalbumin / creatinine urine ratio, Comprehensive metabolic panel  Coronary artery disease of native heart with stable angina pectoris, unspecified vessel or lesion type (HCC)  Chronic systolic congestive heart failure, NYHA class 1 (HCC), Chronic   Plan:   1. Ck labs today. Januvia 100 mg/d, Invokana 300 mg/d, metformin XR 500 mg/d, Actos 30 mg/d. Pt prefers to avoid insulin. Goal is a1c<8. Counseled on diet and exercise. 2. Cont Lipitor 40 mg/d and ASA. Not having CP.  F/u in pending above. The patient voiced understanding and agreement to the plan.  [place], DO 11/28/20 11:09 AM

## 2020-12-03 ENCOUNTER — Telehealth: Payer: Self-pay | Admitting: Family Medicine

## 2020-12-03 ENCOUNTER — Other Ambulatory Visit: Payer: Self-pay | Admitting: Family Medicine

## 2020-12-03 DIAGNOSIS — E875 Hyperkalemia: Secondary | ICD-10-CM

## 2020-12-03 MED ORDER — PIOGLITAZONE HCL 45 MG PO TABS: 45.0000 mg | ORAL_TABLET | Freq: Every day | ORAL | 2 refills | Status: DC

## 2020-12-03 MED ORDER — PIOGLITAZONE HCL 30 MG PO TABS: 30.0000 mg | ORAL_TABLET | Freq: Every day | ORAL | 2 refills | Status: DC

## 2020-12-03 NOTE — Telephone Encounter (Signed)
pioglitazone (ACTOS  & clopidogrel (PLAVIX) 75 MG tablet  Calling to see if Elizabeth [name] was aware of the side effects these meds have together. He states if used together it have increased side effects. Please call back to confirm if Wendlingi s aware and if it's still okay to prescribe both.

## 2020-12-03 NOTE — Telephone Encounter (Signed)
Will send 30 mg tabs.

## 2020-12-03 NOTE — Telephone Encounter (Signed)
Called pharmacy informed of PCP ok.

## 2020-12-03 NOTE — Telephone Encounter (Signed)
Pharmacy needs clarification ok to take together

## 2020-12-03 NOTE — Telephone Encounter (Signed)
Yes. No more than 30 mg/d though.t y.

## 2020-12-03 NOTE — Telephone Encounter (Signed)
Caller Arlys John from [facility] FYI  Clopidogrel and the pioglitazone The two together increase the affect of the Actos

## 2020-12-07 ENCOUNTER — Other Ambulatory Visit: Payer: Medicare PPO

## 2020-12-10 ENCOUNTER — Other Ambulatory Visit (INDEPENDENT_AMBULATORY_CARE_PROVIDER_SITE_OTHER): Payer: Medicare PPO

## 2020-12-10 ENCOUNTER — Other Ambulatory Visit: Payer: Self-pay

## 2020-12-10 DIAGNOSIS — E875 Hyperkalemia: Secondary | ICD-10-CM

## 2020-12-10 LAB — BASIC METABOLIC PANEL
BUN: 29 mg/dL — ABNORMAL HIGH (ref 6–23)
CO2: 26 mEq/L (ref 19–32)
Calcium: 8.9 mg/dL (ref 8.4–10.5)
Chloride: 108 mEq/L (ref 96–112)
Creatinine, Ser: 1.16 mg/dL (ref 0.40–1.20)
GFR: 43.57 mL/min — ABNORMAL LOW (ref >60.00)
Glucose, Bld: 189 mg/dL — ABNORMAL HIGH (ref 70–99)
Potassium: 5.3 mEq/L — ABNORMAL HIGH (ref 3.5–5.1)
Sodium: 140 mEq/L (ref 135–145)

## 2020-12-11 ENCOUNTER — Other Ambulatory Visit: Payer: Self-pay | Admitting: Family Medicine

## 2020-12-11 DIAGNOSIS — E875 Hyperkalemia: Secondary | ICD-10-CM

## 2020-12-11 MED ORDER — LOKELMA 10 G PO PACK: 10.0000 g | PACK | Freq: Every day | ORAL | 0 refills | Status: AC

## 2020-12-19 ENCOUNTER — Other Ambulatory Visit: Payer: Medicare PPO

## 2020-12-24 DIAGNOSIS — I2510 Atherosclerotic heart disease of native coronary artery without angina pectoris: Secondary | ICD-10-CM | POA: Diagnosis not present

## 2020-12-24 DIAGNOSIS — I10 Essential (primary) hypertension: Secondary | ICD-10-CM | POA: Diagnosis not present

## 2020-12-24 DIAGNOSIS — E785 Hyperlipidemia, unspecified: Secondary | ICD-10-CM | POA: Diagnosis not present

## 2020-12-24 DIAGNOSIS — I252 Old myocardial infarction: Secondary | ICD-10-CM | POA: Diagnosis not present

## 2021-01-02 ENCOUNTER — Ambulatory Visit: Payer: Medicare PPO | Admitting: Family Medicine

## 2021-01-30 ENCOUNTER — Ambulatory Visit: Payer: Medicare PPO | Admitting: Family Medicine

## 2021-02-11 ENCOUNTER — Other Ambulatory Visit: Payer: Self-pay | Admitting: Family Medicine

## 2021-03-06 ENCOUNTER — Ambulatory Visit: Payer: Medicare PPO | Admitting: Family Medicine

## 2021-04-15 ENCOUNTER — Other Ambulatory Visit: Payer: Self-pay | Admitting: Family Medicine

## 2021-04-15 DIAGNOSIS — E11319 Type 2 diabetes mellitus with unspecified diabetic retinopathy without macular edema: Secondary | ICD-10-CM

## 2021-04-17 ENCOUNTER — Ambulatory Visit: Payer: Medicare PPO | Admitting: Family Medicine

## 2021-05-06 ENCOUNTER — Other Ambulatory Visit: Payer: Self-pay | Admitting: Family Medicine

## 2021-05-13 ENCOUNTER — Other Ambulatory Visit: Payer: Self-pay | Admitting: Family Medicine

## 2021-05-13 DIAGNOSIS — E11319 Type 2 diabetes mellitus with unspecified diabetic retinopathy without macular edema: Secondary | ICD-10-CM

## 2021-05-16 ENCOUNTER — Ambulatory Visit: Payer: Medicare PPO | Admitting: Family Medicine

## 2021-05-19 ENCOUNTER — Other Ambulatory Visit: Payer: Self-pay | Admitting: Family Medicine

## 2021-05-22 ENCOUNTER — Ambulatory Visit (INDEPENDENT_AMBULATORY_CARE_PROVIDER_SITE_OTHER): Payer: Medicare PPO

## 2021-05-22 VITALS — Ht 61.0 in | Wt 223.0 lb

## 2021-05-22 DIAGNOSIS — Z0000 Encounter for general adult medical examination without abnormal findings: Secondary | ICD-10-CM | POA: Diagnosis not present

## 2021-05-22 NOTE — Patient Instructions (Signed)
Elizabeth [name] , Thank you for taking time to complete your Medicare Wellness Visit. I appreciate your ongoing commitment to your health goals. Please review the following plan we discussed and let me know if I can assist you in the future.   Screening recommendations/referrals: Colonoscopy: No longer required Mammogram: Declined. Please call the office to schedule if you change your mind. Bone Density: Declined. Please call the office to schedule if you change your mind. Recommended yearly ophthalmology/optometry visit for glaucoma screening and checkup Recommended yearly dental visit for hygiene and checkup  Vaccinations: Influenza vaccine: Due-May obtain vaccine at our office or your local pharmacy. Pneumococcal vaccine: Up to date Tdap vaccine: Up to date-Due-12/05/2026 Shingles vaccine:   Discuss with pharmacy Covid-19:Booster available at the pharmacy.  Advanced directives: Please bring a copy for your chart  Conditions/risks identified: See problem list  Next appointment: Follow up in one year for your annual wellness visit 05/28/2022 @ 10:20   Preventive Care 65 Years and Older, Female Preventive care refers to lifestyle choices and visits with your health care provider that can promote health and wellness. What does preventive care include? A yearly physical exam. This is also called an annual well check. Dental exams once or twice a year. Routine eye exams. Ask your health care provider how often you should have your eyes checked. Personal lifestyle choices, including: Daily care of your teeth and gums. Regular physical activity. Eating a healthy diet. Avoiding tobacco and drug use. Limiting alcohol use. Practicing safe sex. Taking low-dose aspirin every day. Taking vitamin and mineral supplements as recommended by your health care provider. What happens during an annual well check? The services and screenings done by your health care provider during your annual well  check will depend on your age, overall health, lifestyle risk factors, and family history of disease. Counseling  Your health care provider may ask you questions about your: Alcohol use. Tobacco use. Drug use. Emotional well-being. Home and relationship well-being. Sexual activity. Eating habits. History of falls. Memory and ability to understand (cognition). Work and work Astronomer. Reproductive health. Screening  You may have the following tests or measurements: Height, weight, and BMI. Blood pressure. Lipid and cholesterol levels. These may be checked every 5 years, or more frequently if you are over 40 years old. Skin check. Lung cancer screening. You may have this screening every year starting at age 47 if you have a 30-pack-year history of smoking and currently smoke or have quit within the past 15 years. Fecal occult blood test (FOBT) of the stool. You may have this test every year starting at age 4. Flexible sigmoidoscopy or colonoscopy. You may have a sigmoidoscopy every 5 years or a colonoscopy every 10 years starting at age 37. Hepatitis C blood test. Hepatitis B blood test. Sexually transmitted disease (STD) testing. Diabetes screening. This is done by checking your blood sugar (glucose) after you have not eaten for a while (fasting). You may have this done every 1-3 years. Bone density scan. This is done to screen for osteoporosis. You may have this done starting at age 69. Mammogram. This may be done every 1-2 years. Talk to your health care provider about how often you should have regular mammograms. Talk with your health care provider about your test results, treatment options, and if necessary, the need for more tests. Vaccines  Your health care provider may recommend certain vaccines, such as: Influenza vaccine. This is recommended every year. Tetanus, diphtheria, and acellular pertussis (Tdap, Td) vaccine. You  may need a Td booster every 10 years. Zoster  vaccine. You may need this after age 63. Pneumococcal 13-valent conjugate (PCV13) vaccine. One dose is recommended after age 5. Pneumococcal polysaccharide (PPSV23) vaccine. One dose is recommended after age 69. Talk to your health care provider about which screenings and vaccines you need and how often you need them. This information is not intended to replace advice given to you by your health care provider. Make sure you discuss any questions you have with your health care provider. Document Released: 08/31/2015 Document Revised: 04/23/2016 Document Reviewed: 06/05/2015 Elsevier Interactive Patient Education  2017 [place] Prevention in the Home Falls can cause injuries. They can happen to people of all ages. There are many things you can do to make your home safe and to help prevent falls. What can I do on the outside of my home? Regularly fix the edges of walkways and driveways and fix any cracks. Remove anything that might make you trip as you walk through a door, such as a raised step or threshold. Trim any bushes or trees on the path to your home. Use bright outdoor lighting. Clear any walking paths of anything that might make someone trip, such as rocks or tools. Regularly check to see if handrails are loose or broken. Make sure that both sides of any steps have handrails. Any raised decks and porches should have guardrails on the edges. Have any leaves, snow, or ice cleared regularly. Use sand or salt on walking paths during winter. Clean up any spills in your garage right away. This includes oil or grease spills. What can I do in the bathroom? Use night lights. Install grab bars by the toilet and in the tub and shower. Do not use towel bars as grab bars. Use non-skid mats or decals in the tub or shower. If you need to sit down in the shower, use a plastic, non-slip stool. Keep the floor dry. Clean up any water that spills on the floor as soon as it happens. Remove  soap buildup in the tub or shower regularly. Attach bath mats securely with double-sided non-slip rug tape. Do not have throw rugs and other things on the floor that can make you trip. What can I do in the bedroom? Use night lights. Make sure that you have a light by your bed that is easy to reach. Do not use any sheets or blankets that are too big for your bed. They should not hang down onto the floor. Have a firm chair that has side arms. You can use this for support while you get dressed. Do not have throw rugs and other things on the floor that can make you trip. What can I do in the kitchen? Clean up any spills right away. Avoid walking on wet floors. Keep items that you use a lot in easy-to-reach places. If you need to reach something above you, use a strong step stool that has a grab bar. Keep electrical cords out of the way. Do not use floor polish or wax that makes floors slippery. If you must use wax, use non-skid floor wax. Do not have throw rugs and other things on the floor that can make you trip. What can I do with my stairs? Do not leave any items on the stairs. Make sure that there are handrails on both sides of the stairs and use them. Fix handrails that are broken or loose. Make sure that handrails are as long as the  stairways. Check any carpeting to make sure that it is firmly attached to the stairs. Fix any carpet that is loose or worn. Avoid having throw rugs at the top or bottom of the stairs. If you do have throw rugs, attach them to the floor with carpet tape. Make sure that you have a light switch at the top of the stairs and the bottom of the stairs. If you do not have them, ask someone to add them for you. What else can I do to help prevent falls? Wear shoes that: Do not have high heels. Have rubber bottoms. Are comfortable and fit you well. Are closed at the toe. Do not wear sandals. If you use a stepladder: Make sure that it is fully opened. Do not climb a  closed stepladder. Make sure that both sides of the stepladder are locked into place. Ask someone to hold it for you, if possible. Clearly mark and make sure that you can see: Any grab bars or handrails. First and last steps. Where the edge of each step is. Use tools that help you move around (mobility aids) if they are needed. These include: Canes. Walkers. Scooters. Crutches. Turn on the lights when you go into a dark area. Replace any light bulbs as soon as they burn out. Set up your furniture so you have a clear path. Avoid moving your furniture around. If any of your floors are uneven, fix them. If there are any pets around you, be aware of where they are. Review your medicines with your doctor. Some medicines can make you feel dizzy. This can increase your chance of falling. Ask your doctor what other things that you can do to help prevent falls. This information is not intended to replace advice given to you by your health care provider. Make sure you discuss any questions you have with your health care provider. Document Released: 05/31/2009 Document Revised: 01/10/2016 Document Reviewed: 09/08/2014 Elsevier Interactive Patient Education  2017 [employer].

## 2021-05-22 NOTE — Progress Notes (Addendum)
Subjective:   Elizabeth [name] is a 86 y.o. female who presents for Medicare Annual (Subsequent) preventive examination.  I connected with [facility] today by telephone and verified that I am speaking with the correct person using two identifiers. Location patient: home Location provider: work Persons participating in the virtual visit: patient, Engineer, civil (consulting).    I discussed the limitations, risks, security and privacy concerns of performing an evaluation and management service by telephone and the availability of in person appointments. I also discussed with the patient that there may be a patient responsible charge related to this service. The patient expressed understanding and verbally consented to this telephonic visit.    Interactive audio and video telecommunications were attempted between this provider and patient, however failed, due to patient having technical difficulties OR patient did not have access to video capability.  We continued and completed visit with audio only.  Some vital signs may be absent or patient reported.   Time Spent with patient on telephone encounter: 20 minutes   Review of Systems     Cardiac Risk Factors include: advanced age (>74men, >40 women);diabetes mellitus;hypertension;obesity (BMI >30kg/m2);sedentary lifestyle     Objective:    Today's Vitals   05/22/21 1021  Weight: 223 lb (101.2 kg)  Height: 5\' 1"  (1.549 m)   Body mass index is 42.14 kg/m.  Advanced Directives 05/22/2021 10/10/2019 03/11/2018 06/01/2017 06/01/2017 12/18/2016 07/12/2016  Does Patient Have a Medical Advance Directive? Yes No No No No No No  Type of Advance Directive Living will;Healthcare Power of Attorney - - - - - -  Copy of Healthcare Power of Attorney in Chart? No - copy requested - - - - - -  Would patient like information on creating a medical advance directive? - No - Patient declined Yes (MAU/Ambulatory/Procedural Areas - Information given) No - Patient declined - No - Patient  declined No - Patient declined    Current Medications (verified) Outpatient Encounter Medications as of 05/22/2021  Medication Sig   acetaminophen (TYLENOL) 500 MG tablet Take 500 mg by mouth every 6 (six) hours as needed for headache (pain).   ASPIRIN LOW DOSE 81 MG EC tablet TAKE 1 TABLET BY MOUTH DAILY   atorvastatin (LIPITOR) 40 MG tablet TAKE ONE TABLET BY MOUTH ONE TIME DAILY   clopidogrel (PLAVIX) 75 MG tablet TAKE ONE TABLET BY MOUTH EVERY MORNING   furosemide (LASIX) 40 MG tablet TAKE 1 TABLET BY MOUTH DAILY AS NEEDED FOR SWELLING   glucose blood test strip Use as instructed to check blood sugar 3 times per day Diagnoses Code 250.00   INVOKANA 300 MG TABS tablet TAKE ONE TABLET BY MOUTH ONE TIME DAILY BEFORE BREAKFAST   JANUVIA 100 MG tablet TAKE ONE TABLET BY MOUTH ONE TIME DAILY   Lancets (FREESTYLE) lancets Use as instructed to check blood sugar 3 times per day Diagnoses Code 250.00   loperamide (IMODIUM) 2 MG capsule Take 2 mg by mouth as needed for diarrhea or loose stools.   metFORMIN (GLUCOPHAGE-XR) 500 MG 24 hr tablet TAKE ONE TABLET BY MOUTH ONE TIME DAILY WITH BREAKFAST   metoprolol tartrate (LOPRESSOR) 50 MG tablet TAKE ONE-HALF TABLET BY MOUTH TWICE A DAY   nitroGLYCERIN (NITROSTAT) 0.4 MG SL tablet Place 1 tablet (0.4 mg total) under the tongue every 5 (five) minutes as needed for chest pain.   Olopatadine HCl 0.2 % SOLN Place 1 drop into both eyes as needed.    OVER THE COUNTER MEDICATION Apply 1 application topically 2 (  two) times daily as needed (eczema). "Exerderm"   pioglitazone (ACTOS) 30 MG tablet Take 1 tablet (30 mg total) by mouth daily.   HYDROcodone-acetaminophen (NORCO) 5-325 MG tablet Take 1 tablet by mouth every 6 (six) hours as needed. (Patient not taking: Reported on 05/22/2021)   No facility-administered encounter medications on file as of 05/22/2021.    Allergies (verified) Sulfa antibiotics and Cyclobenzaprine hcl   History: Past Medical History:   Diagnosis Date   Allergy    Blindness, one eye, low vision other eye    blind left eye   CAD (coronary artery disease)    Diabetes mellitus    type II   Diabetic retinopathy    Dr Perley Jain @ [facility] (vitrectomy left eye)   Gastroenteritis 02/13/2013   History of GI bleed    secondary to heparin   Hypertension    Right hip pain 02/13/2013   Shingles    Tubal pregnancy    Past Surgical History:  Procedure Laterality Date   APPENDECTOMY     CATARACT EXTRACTION Bilateral    CORONARY ANGIOPLASTY  2010   heart stent x 1   EYE SURGERY     eye surgery for blood behind eye  left eye   KNEE ARTHROSCOPY Right 10/10/2019   Procedure: RIGHT KNEE ARTHROSCOPY;  Surgeon: Gean Birchwood, MD;  Location: [facility];  Service: Orthopedics;  Laterality: Right;   LAMINOTOMY     Dr Jordan Likes L3-4  and microdiskectomy   surgery for tubal pregnancy     TONSILLECTOMY     TUBAL LIGATION     Family History  Problem Relation Age of Onset   Stroke Father    Coronary artery disease Other    Heart disease Mother    Social History   Socioeconomic History   Marital status: Married    Spouse name: Not on file   Number of children: 3   Years of education: Not on file   Highest education level: Not on file  Occupational History   Occupation: Retired    Associate Professor: RETIRED  Tobacco Use   Smoking status: Never   Smokeless tobacco: Never  Vaping Use   Vaping Use: Never used  Substance and Sexual Activity   Alcohol use: No   Drug use: No   Sexual activity: Never  Other Topics Concern   Not on file  Social History Narrative   Not on file   Social Determinants of Health   Financial Resource Strain: Low Risk    Difficulty of Paying Living Expenses: Not hard at all  Food Insecurity: No Food Insecurity   Worried About Programme researcher, broadcasting/film/video in the Last Year: Never true   Ran Out of Food in the Last Year: Never true  Transportation Needs: No Transportation Needs   Lack of Transportation  (Medical): No   Lack of Transportation (Non-Medical): No  Physical Activity: Inactive   Days of Exercise per Week: 0 days   Minutes of Exercise per Session: 0 min  Stress: No Stress Concern Present   Feeling of Stress : Only a little  Social Connections: Moderately Integrated   Frequency of Communication with Friends and Family: More than three times a week   Frequency of Social Gatherings with Friends and Family: More than three times a week   Attends Religious Services: More than 4 times per year   Active Member of [employer] or Organizations: No   Attends Banker Meetings: Never   Marital Status: Married  Tobacco Counseling Counseling given: Not Answered   Clinical Intake:  Pre-visit preparation completed: No  Pain : No/denies pain     BMI - recorded: 42.14 Nutritional Status: BMI > 30  Obese Nutritional Risks: None Diabetes: Yes CBG done?: No Did pt. bring in CBG monitor from home?: No (phone visit)  How often do you need to have someone help you when you read instructions, pamphlets, or other written materials from your doctor or pharmacy?: 1 - Never Diabetes:  Is the patient diabetic?  Yes  If diabetic, was a CBG obtained today?  No  Did the patient bring in their glucometer from home?  No phone visit How often do you monitor your CBG's? occasionally.   Financial Strains and Diabetes Management:  Are you having any financial strains with the device, your supplies or your medication? No .  Does the patient want to be seen by Chronic Care Management for management of their diabetes?  No  Would the patient like to be referred to a Nutritionist or for Diabetic Management?  No   Diabetic Exams:  Diabetic Eye Exam: Completed 10/28/2020.   Diabetic Foot Exam: Completed 11/28/2020.   Interpreter Needed?: No  Information entered by :: Thomasenia Sales LPN   Activities of Daily Living In your present state of health, do you have any difficulty performing  the following activities: 05/22/2021 11/28/2020  Hearing? N N  Vision? N Y  Difficulty concentrating or making decisions? N N  Walking or climbing stairs? N Y  Dressing or bathing? N N  Doing errands, shopping? 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 N -  Using the Toilet? N -  In the past six months, have you accidently leaked urine? N -  Do you have problems with loss of bowel control? N -  Managing your Medications? N -  Managing your Finances? N -  Housekeeping or managing your Housekeeping? N -  Some recent data might be hidden    Patient Care Team: Sharlene Dory, DO as PCP - General (Family Medicine) Marvis Repress, MD as Consulting Physician (Ophthalmology) Diamond Nickel., MD as Consulting Physician (Cardiology) Jackelyn Hoehn, MD as Consulting Physician (Dermatology) Rick Duff, DDS (Dentistry)  Indicate any recent Medical Services you may have received from other than [facility] providers in the past year (date may be approximate).     Assessment:   This is a routine wellness examination for Elizabeth [name].  Hearing/Vision screen Hearing Screening - Comments:: No issues Vision Screening - Comments:: Last eye exam-10/2020-Dr. Leonia Corona  Dietary issues and exercise activities discussed: Current Exercise Habits: The patient does not participate in regular exercise at present, Exercise limited by: None identified   Goals Addressed             This Visit's Progress    Eat more fruits and vegetables   On track    Increase physical activity   Not on track    Increase walking to 20 minutes daily and continue to increase physical activity as tolerated.     Maintain current health   On track      Depression Screen PHQ 2/9 Scores 05/22/2021 11/28/2020 03/11/2018 12/18/2016 08/06/2016 11/29/2014  PHQ - 2 Score 1 0 0 0 0 0    Fall Risk Fall Risk  05/22/2021 11/28/2020 03/11/2018 12/18/2016 08/06/2016  Falls in the past year? 1 1 No No No  Number falls  in past yr: 0 1 - - -  Injury with Fall? 0 0 - - -  Risk for fall due to : History of fall(s) - - - -  Follow up Falls prevention discussed - - - -    FALL RISK PREVENTION PERTAINING TO THE HOME:  Any stairs in or around the home? Yes  If so, are there any without handrails? No  Home free of loose throw rugs in walkways, pet beds, electrical cords, etc? Yes  Adequate lighting in your home to reduce risk of falls? Yes   ASSISTIVE DEVICES UTILIZED TO PREVENT FALLS:  Life alert? No  Use of a cane, walker or w/c? Yes  Grab bars in the bathroom? Yes  Shower chair or bench in shower? Yes  Elevated toilet seat or a handicapped toilet? No   TIMED UP AND GO:  Was the test performed? No . Phone visit   Cognitive Function:Normal cognitive status assessed by this Nurse Health Advisor. No abnormalities found.   MMSE - Mini Mental State Exam 12/18/2016  Orientation to time 5  Orientation to Place 5  Registration 3  Attention/ Calculation 5  Recall 3  Language- name 2 objects 2  Language- repeat 1  Language- follow 3 step command 3  Language- read & follow direction 1  Write a sentence 1  Copy design 1  Total score 30        Immunizations Immunization History  Administered Date(s) Administered   Influenza Split 06/24/2011, 06/22/2012   Influenza Whole 09/20/2009, 04/15/2010   Influenza, High Dose Seasonal PF 06/06/2013, 08/06/2016, 05/27/2017   PFIZER(Purple Top)SARS-COV-2 Vaccination 08/31/2019, 09/20/2019, 06/16/2020   Pneumococcal Conjugate-13 12/04/2016   Pneumococcal Polysaccharide-23 09/22/2006   Td 03/02/2006   Tdap 12/04/2016    TDAP status: Up to date  Flu Vaccine status: Due, Education has been provided regarding the importance of this vaccine. Advised may receive this vaccine at local pharmacy or Health Dept. Aware to provide a copy of the vaccination record if obtained from local pharmacy or Health Dept. Verbalized acceptance and understanding.  Pneumococcal  vaccine status: Up to date  Covid-19 vaccine status: Information provided on how to obtain vaccines. Booster due  Qualifies for Shingles Vaccine? Yes   Zostavax completed No   Shingrix Completed?: No.    Education has been provided regarding the importance of this vaccine. Patient has been advised to call insurance company to determine out of pocket expense if they have not yet received this vaccine. Advised may also receive vaccine at local pharmacy or Health Dept. Verbalized acceptance and understanding.  Screening Tests Health Maintenance  Topic Date Due   Zoster Vaccines- Shingrix (1 of 2) Never done   COVID-19 Vaccine (4 - Booster for Pfizer series) 10/15/2020   INFLUENZA VACCINE  03/18/2021   HEMOGLOBIN A1C  05/30/2021   OPHTHALMOLOGY EXAM  10/28/2021   FOOT EXAM  11/28/2021   URINE MICROALBUMIN  11/28/2021   TETANUS/TDAP  12/05/2026   DEXA SCAN  Completed   HPV VACCINES  Aged Out    Health Maintenance  Health Maintenance Due  Topic Date Due   Zoster Vaccines- Shingrix (1 of 2) Never done   COVID-19 Vaccine (4 - Booster for Pfizer series) 10/15/2020   INFLUENZA VACCINE  03/18/2021    Colorectal cancer screening: No longer required.   Mammogram status: No longer required due to patient declined.  Bone Density status: Declined  Lung Cancer Screening: (Low Dose CT Chest recommended if Age 32-80 years, 30 pack-year currently smoking OR have quit w/in 15years.) does not qualify.  Additional Screening:  Hepatitis C Screening: does not qualify  Vision Screening: Recommended annual ophthalmology exams for early detection of glaucoma and other disorders of the eye. Is the patient up to date with their annual eye exam?  Yes  Who is the provider or what is the name of the office in which the patient attends annual eye exams? Dr. Leonia Corona   Dental Screening: Recommended annual dental exams for proper oral hygiene  Community Resource Referral / Chronic Care  Management: CRR required this visit?  No   CCM required this visit?  No      Plan:     I have personally reviewed and noted the following in the patient's chart:   Medical and social history Use of alcohol, tobacco or illicit drugs  Current medications and supplements including opioid prescriptions.  Functional ability and status Nutritional status Physical activity Advanced directives List of other physicians Hospitalizations, surgeries, and ER visits in previous 12 months Vitals Screenings to include cognitive, depression, and falls Referrals and appointments  In addition, I have reviewed and discussed with patient certain preventive protocols, quality metrics, and best practice recommendations. A written personalized care plan for preventive services as well as general preventive health recommendations were provided to patient.  . Due to this being a telephonic visit, the after visit summary with patients personalized plan was offered to patient via mail or my-chart. Patient would like to access on my-chart.   Roanna Raider, LPN   32/09/252  Nurse Health Advisor  Nurse Notes: None   I have reviewed and agree with Health Coaches documentation.  Willow Ora, MD

## 2021-06-05 ENCOUNTER — Ambulatory Visit: Payer: Medicare PPO | Admitting: Family Medicine

## 2021-07-17 ENCOUNTER — Ambulatory Visit: Payer: Medicare PPO | Admitting: Family Medicine

## 2021-08-06 ENCOUNTER — Ambulatory Visit: Payer: Medicare PPO | Admitting: Family Medicine

## 2021-09-03 ENCOUNTER — Ambulatory Visit: Payer: Medicare PPO | Admitting: Family Medicine

## 2021-09-15 ENCOUNTER — Other Ambulatory Visit: Payer: Self-pay | Admitting: Family Medicine

## 2021-09-25 ENCOUNTER — Ambulatory Visit: Payer: Medicare PPO | Admitting: Family Medicine

## 2021-10-13 ENCOUNTER — Other Ambulatory Visit: Payer: Self-pay | Admitting: Family Medicine

## 2021-10-13 DIAGNOSIS — E11319 Type 2 diabetes mellitus with unspecified diabetic retinopathy without macular edema: Secondary | ICD-10-CM

## 2021-10-23 ENCOUNTER — Ambulatory Visit: Payer: Medicare PPO | Admitting: Family Medicine

## 2021-11-04 ENCOUNTER — Other Ambulatory Visit: Payer: Self-pay | Admitting: Family Medicine

## 2021-11-10 ENCOUNTER — Other Ambulatory Visit: Payer: Self-pay | Admitting: Family Medicine

## 2021-11-10 DIAGNOSIS — E11319 Type 2 diabetes mellitus with unspecified diabetic retinopathy without macular edema: Secondary | ICD-10-CM

## 2021-11-12 ENCOUNTER — Ambulatory Visit: Payer: Medicare PPO | Admitting: Family Medicine

## 2021-11-17 ENCOUNTER — Other Ambulatory Visit: Payer: Self-pay | Admitting: Family Medicine

## 2021-11-17 DIAGNOSIS — E11319 Type 2 diabetes mellitus with unspecified diabetic retinopathy without macular edema: Secondary | ICD-10-CM

## 2021-11-25 ENCOUNTER — Emergency Department
Admission: EM | Admit: 2021-11-25 | Discharge: 2021-11-25 | Disposition: A | Discharge status: Home / Self Care | Payer: Medicare PPO | Source: Home / Self Care | Attending: Family Medicine | Admitting: Family Medicine

## 2021-11-25 ENCOUNTER — Emergency Department (INDEPENDENT_AMBULATORY_CARE_PROVIDER_SITE_OTHER): Payer: Medicare PPO

## 2021-11-25 DIAGNOSIS — R509 Fever, unspecified: Secondary | ICD-10-CM | POA: Diagnosis not present

## 2021-11-25 DIAGNOSIS — J189 Pneumonia, unspecified organism: Secondary | ICD-10-CM | POA: Diagnosis not present

## 2021-11-25 DIAGNOSIS — J069 Acute upper respiratory infection, unspecified: Secondary | ICD-10-CM

## 2021-11-25 DIAGNOSIS — R197 Diarrhea, unspecified: Secondary | ICD-10-CM

## 2021-11-25 DIAGNOSIS — R058 Other specified cough: Secondary | ICD-10-CM

## 2021-11-25 DIAGNOSIS — R5383 Other fatigue: Secondary | ICD-10-CM | POA: Diagnosis not present

## 2021-11-25 DIAGNOSIS — R059 Cough, unspecified: Secondary | ICD-10-CM | POA: Diagnosis not present

## 2021-11-25 LAB — POCT INFLUENZA A/B
Influenza A, POC: NEGATIVE
Influenza B, POC: NEGATIVE

## 2021-11-25 MED ORDER — AMOXICILLIN-POT CLAVULANATE 875-125 MG PO TABS: 1.0000 | ORAL_TABLET | Freq: Two times a day (BID) | ORAL | 0 refills | Status: DC

## 2021-11-25 MED ORDER — CEFTRIAXONE SODIUM 500 MG IJ SOLR
500.0000 mg | Freq: Once | INTRAMUSCULAR | Status: AC
  Administered 2021-11-25: 500 mg via INTRAMUSCULAR

## 2021-11-25 MED ORDER — BENZONATATE 200 MG PO CAPS: 200.0000 mg | ORAL_CAPSULE | Freq: Two times a day (BID) | ORAL | 0 refills | Status: DC | PRN

## 2021-11-25 MED ORDER — AZITHROMYCIN 250 MG PO TABS: ORAL_TABLET | ORAL | 0 refills | Status: DC

## 2021-11-25 NOTE — ED Provider Notes (Addendum)
[name]    CSN: [id number] Arrival date & time: 11/25/21  1925      History   Chief Complaint Chief Complaint  Patient presents with   Cough    Cough, fever, diarrhea, and loss of appetite. X1 week    HPI Elizabeth [name] is a 87 y.o. female.   HPI  Patient is an 87 year old with coronary artery disease, diabetes, diabetes retinopathy, hypertension, arthritis.  On multiple medication.  Under the care of Dr. Marlene Bast.  Diabetes has been poorly controlled with last A1c of 9.5.  She also has impaired kidney function with a GFR of 44 She is here because she has a cough.  She states he has been coughing for over a week.  She also has diarrhea.  She states the cough is unremitting.  She had a decreased appetite.  She feels very tired.  Denies any underlying lung disease such as COPD or asthma.  Non-smoker.  No known exposure to illness.  Stays home with her husband who is on hospice.  She states the hospice nurse came and visited a little over a week ago with a "terrible cough".  The nurse said she was not contagious. COVID test was done at home and this was negative.   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   CAD (coronary artery disease)    Diabetes mellitus    type II   Diabetic retinopathy    Dr Perley Jain @ [facility] (vitrectomy left eye)   Gastroenteritis 02/13/2013   History of GI bleed    secondary to heparin   Hypertension    Right hip pain 02/13/2013   Shingles    Tubal pregnancy     Patient Active Problem List   Diagnosis Date Noted   Acute lateral meniscus tear of right knee 10/07/2019   Chronic systolic congestive heart failure, NYHA class 1 (HCC) 08/26/2017   Acute diastolic (congestive) heart failure (HCC) 06/02/2017   Acute on chronic diastolic CHF (congestive heart failure) (HCC) 06/01/2017   Bladder spasm 06/01/2017   URI (upper respiratory infection) 11/29/2014   Diabetic  neuropathy (HCC) 11/17/2014   Right hip pain 02/13/2013   Gastroenteritis 02/13/2013   Muscle cramp 02/13/2013   Chronic cough 06/22/2012   Postmenopausal bone loss 02/11/2012   Anemia 02/11/2012   BACK PAIN, LUMBAR, CHRONIC 06/19/2010   BACK PAIN, THORACIC REGION 04/15/2010   Coronary artery disease of native heart with stable angina pectoris (HCC) 09/24/2009   Diabetes mellitus type 2 in obese (HCC) 09/20/2009   Essential hypertension 09/20/2009   MYOCARDIAL INFARCTION, HX OF 09/20/2009   ALLERGIC RHINITIS 09/20/2009    Past Surgical History:  Procedure Laterality Date   APPENDECTOMY     CATARACT EXTRACTION Bilateral    CORONARY ANGIOPLASTY  2010   heart stent x 1   EYE SURGERY     eye surgery for blood behind eye  left eye   KNEE ARTHROSCOPY Right 10/10/2019   Procedure: RIGHT KNEE ARTHROSCOPY;  Surgeon: Gean Birchwood, MD;  Location: [facility];  Service: Orthopedics;  Laterality: Right;   LAMINOTOMY     Dr Jordan Likes L3-4  and microdiskectomy   surgery for tubal pregnancy     TONSILLECTOMY     TUBAL LIGATION      OB History   No obstetric history on file.      Home Medications  Prior to Admission medications   Medication Sig Start Date End Date Taking? Authorizing Provider  acetaminophen (TYLENOL) 500 MG tablet Take 500 mg by mouth every 6 (six) hours as needed for headache (pain).   Yes [provider]  amoxicillin-clavulanate (AUGMENTIN) 875-125 MG tablet Take 1 tablet by mouth every 12 (twelve) hours. 11/25/21  Yes Eustace Moore, MD  ASPIRIN LOW DOSE 81 MG EC tablet TAKE 1 TABLET BY MOUTH DAILY 12/21/17  Yes Sharlene Dory, DO  atorvastatin (LIPITOR) 40 MG tablet TAKE ONE TABLET BY MOUTH ONE TIME DAILY 11/04/21  Yes Sharlene Dory, DO  azithromycin (ZITHROMAX Z-PAK) 250 MG tablet Take two pills today followed by one a day until gone 11/25/21  Yes Eustace Moore, MD  benzonatate (TESSALON) 200 MG capsule Take 1 capsule  (200 mg total) by mouth 2 (two) times daily as needed for cough. 11/25/21  Yes Eustace Moore, MD  clopidogrel (PLAVIX) 75 MG tablet TAKE ONE TABLET BY MOUTH EVERY MORNING 11/11/21  Yes Sharlene Dory, DO  furosemide (LASIX) 40 MG tablet TAKE 1 TABLET BY MOUTH DAILY AS NEEDED FOR SWELLING 12/14/19  Yes Sharlene Dory, DO  glucose blood test strip Use as instructed to check blood sugar 3 times per day Diagnoses Code 250.00 12/04/16  Yes [name], Jilda Roche, DO  HYDROcodone-acetaminophen (NORCO) 5-325 MG tablet Take 1 tablet by mouth every 6 (six) hours as needed. 10/10/19  Yes Allena Katz, PA-C  INVOKANA 300 MG TABS tablet TAKE ONE TABLET BY MOUTH ONE TIME DAILY BEFORE BREAKFAST 11/18/21  Yes Sharlene Dory, DO  JANUVIA 100 MG tablet TAKE ONE TABLET BY MOUTH ONE TIME DAILY 09/16/21  Yes Sharlene Dory, DO  Lancets (FREESTYLE) lancets Use as instructed to check blood sugar 3 times per day Diagnoses Code 250.00 07/09/16  Yes Mesner, Barbara Cower, MD  loperamide (IMODIUM) 2 MG capsule Take 2 mg by mouth as needed for diarrhea or loose stools.   Yes [provider]  metFORMIN (GLUCOPHAGE-XR) 500 MG 24 hr tablet TAKE ONE TABLET BY MOUTH ONE TIME DAILY WITH BREAKFAST 10/14/21  Yes Sharlene Dory, DO  metoprolol tartrate (LOPRESSOR) 50 MG tablet TAKE ONE-HALF TABLET BY MOUTH TWICE A DAY 05/20/21  Yes Sharlene Dory, DO  nitroGLYCERIN (NITROSTAT) 0.4 MG SL tablet Place 1 tablet (0.4 mg total) under the tongue every 5 (five) minutes as needed for chest pain. 07/09/16  Yes Mesner, Barbara Cower, MD  Olopatadine HCl 0.2 % SOLN Place 1 drop into both eyes as needed.    Yes [provider]  OVER THE COUNTER MEDICATION Apply 1 application topically 2 (two) times daily as needed (eczema). "Exerderm"   Yes [provider]  pioglitazone (ACTOS) 30 MG tablet Take 1 tablet (30 mg total) by mouth daily. 12/03/20  Yes [name], Jilda Roche, DO     Family History Family History  Problem Relation Age of Onset   Stroke Father    Coronary artery disease Other    Heart disease Mother     Social History Social History   Tobacco Use   Smoking status: Never   Smokeless tobacco: Never  Vaping Use   Vaping Use: Never used  Substance Use Topics   Alcohol use: No   Drug use: No     Allergies   Sulfa antibiotics and Cyclobenzaprine hcl   Review of Systems Review of Systems See HPI  Physical Exam Triage Vital Signs ED Triage Vitals  Enc Vitals Group  BP 11/25/21 1937 (!) 147/80     Pulse --      Resp 11/25/21 1937 16     Temp 11/25/21 1937 99.5 F (37.5 C)     Temp src --      SpO2 11/25/21 1937 98 %     Weight 11/25/21 1935 220 lb (99.8 kg)     Height 11/25/21 1935 5\' 1"  (1.549 m)     Head Circumference --      Peak Flow --      Pain Score 11/25/21 1935 0     Pain Loc --      Pain Edu? --      Excl. in GC? --    No data found.  Updated Vital Signs BP (!) 147/80 (BP Location: Right Arm)   Temp 99.5 F (37.5 C)   Resp 16   Ht 5\' 1"  (1.549 m)   Wt 99.8 kg   SpO2 98%   BMI 41.57 kg/m      Physical Exam Constitutional:      General: She is not in acute distress.    Appearance: She is well-developed. She is obese. She is ill-appearing.     Comments: Is in a wheelchair  HENT:     Head: Normocephalic and atraumatic.     Right Ear: Tympanic membrane and ear canal normal.     Left Ear: Tympanic membrane and ear canal normal.     Nose: No congestion.     Mouth/Throat:     Mouth: Mucous membranes are moist.     Pharynx: No posterior oropharyngeal erythema.  Eyes:     Conjunctiva/sclera: Conjunctivae normal.     Pupils: Pupils are equal, round, and reactive to light.     Comments: Vision is impaired  Cardiovascular:     Rate and Rhythm: Normal rate and regular rhythm.     Heart sounds: Normal heart sounds.     Comments: Faint Pulmonary:     Effort: Pulmonary effort is normal. No respiratory  distress.     Breath sounds: Rales present.     Comments: Both bases Abdominal:     General: Bowel sounds are normal. There is no distension.     Palpations: Abdomen is soft.  Musculoskeletal:        General: Normal range of motion.     Cervical back: Normal range of motion and neck supple.     Right lower leg: Edema present.     Left lower leg: Edema present.  Lymphadenopathy:     Cervical: No cervical adenopathy.  Skin:    General: Skin is warm and dry.  Neurological:     General: No focal deficit present.     Mental Status: She is alert.  Psychiatric:        Mood and Affect: Mood normal.        Behavior: Behavior normal.     UC Treatments / Results  Labs (all labs ordered are listed, but only abnormal results are displayed) Labs Reviewed  POCT INFLUENZA A/B  Home COVID test was negative Office flu test is negative  EKG   Radiology DG Chest 2 View  Result Date: 11/25/2021 CLINICAL DATA:  Productive cough, fever and fatigue. EXAM: CHEST - 2 VIEW COMPARISON:  June 01, 2017 FINDINGS: Chronic appearing increased lung markings are seen. A trace amount of left basilar atelectasis and/or early infiltrate is seen. There is no evidence of a pleural effusion or pneumothorax. The heart size and mediastinal contours are  within normal limits. There is mild calcification of the aortic arch. The visualized skeletal structures are unremarkable. IMPRESSION: Chronic appearing increased lung markings with a trace amount of left basilar atelectasis and/or early infiltrate. Electronically Signed   By: Aram Candela M.D.   On: 11/25/2021 20:17    Procedures Procedures (including critical care time)  Medications Ordered in UC Medications - No data to display  Initial Impression / Assessment and Plan / UC Course  I have reviewed the triage vital signs and the nursing notes.  Pertinent labs & imaging results that were available during my care of the patient were reviewed by me and  considered in my medical decision making (see chart for details).     I ordered a chest x-ray because she has rales in both bases.  It shows an area that may be atelectasis or early infiltrate in the left base.  With the rales and her findings, I do think covering her with antibiotics is appropriate.  Follow up with PCP.  As I am giving the patient  and her daughter discharge instructions they inform me that the pharmacy they indicated closed at 7.  They didn't arrive until after 7, and it is now after 8.  I am giving her a shot of rocephin for tonight to pick up the other Rx tomorrow.  Final Clinical Impressions(s) / UC Diagnoses   Final diagnoses:  Viral URI with cough  Diarrhea, unspecified type     Discharge Instructions      Drink lots of water Run a humidifier in the bedroom if you have 1 Take Tessalon 2 times a day for cough Take antibiotics as directed.  Augmentin as prescribed 1 pill 2 times a day.  Z-Pak is taken as directed, 2 pills on the first day then 1 a day until gone Call your doctor if not improving towards the end of the week You may take over-the-counter Imodium as needed for diarrhea.  1 pill twice a day as needed     ED Prescriptions     Medication Sig Dispense Auth. Provider   amoxicillin-clavulanate (AUGMENTIN) 875-125 MG tablet Take 1 tablet by mouth every 12 (twelve) hours. 14 tablet Eustace Moore, MD   benzonatate (TESSALON) 200 MG capsule Take 1 capsule (200 mg total) by mouth 2 (two) times daily as needed for cough. 20 capsule Eustace Moore, MD   azithromycin (ZITHROMAX Z-PAK) 250 MG tablet Take two pills today followed by one a day until gone 6 tablet Delton See Letta Pate, MD      PDMP not reviewed this encounter.   Eustace Moore, MD 11/25/21 2021    Eustace Moore, MD 11/25/21 2031

## 2021-11-25 NOTE — ED Triage Notes (Signed)
Daughter states that pt has a cough, fever, diarrhea and loss of appetite. X1 week ? ?Pt had negative covid test 4/10. ?Pt is vaccinated for covid.  ?Pt is vaccinated for flu.  ?

## 2021-11-25 NOTE — Discharge Instructions (Addendum)
Drink lots of water ?Run a humidifier in the bedroom if you have 1 ?Take Tessalon 2 times a day for cough ?Take antibiotics as directed.  Augmentin as prescribed 1 pill 2 times a day.  Z-Pak is taken as directed, 2 pills on the first day then 1 a day until gone ?Call your doctor if not improving towards the end of the week ?You may take over-the-counter Imodium as needed for diarrhea.  1 pill twice a day as needed ?

## 2021-12-01 ENCOUNTER — Other Ambulatory Visit: Payer: Self-pay | Admitting: Family Medicine

## 2021-12-11 ENCOUNTER — Ambulatory Visit: Payer: Medicare PPO | Admitting: Family Medicine

## 2022-01-01 ENCOUNTER — Ambulatory Visit: Payer: Medicare PPO | Admitting: Family Medicine

## 2022-01-09 DIAGNOSIS — L570 Actinic keratosis: Secondary | ICD-10-CM | POA: Diagnosis not present

## 2022-01-09 DIAGNOSIS — L2089 Other atopic dermatitis: Secondary | ICD-10-CM | POA: Diagnosis not present

## 2022-01-09 DIAGNOSIS — L281 Prurigo nodularis: Secondary | ICD-10-CM | POA: Diagnosis not present

## 2022-01-09 DIAGNOSIS — L578 Other skin changes due to chronic exposure to nonionizing radiation: Secondary | ICD-10-CM | POA: Diagnosis not present

## 2022-01-09 DIAGNOSIS — Z85828 Personal history of other malignant neoplasm of skin: Secondary | ICD-10-CM | POA: Diagnosis not present

## 2022-01-15 DIAGNOSIS — E782 Mixed hyperlipidemia: Secondary | ICD-10-CM | POA: Diagnosis not present

## 2022-01-15 DIAGNOSIS — I252 Old myocardial infarction: Secondary | ICD-10-CM | POA: Diagnosis not present

## 2022-01-15 DIAGNOSIS — E1169 Type 2 diabetes mellitus with other specified complication: Secondary | ICD-10-CM | POA: Diagnosis not present

## 2022-01-15 DIAGNOSIS — E11319 Type 2 diabetes mellitus with unspecified diabetic retinopathy without macular edema: Secondary | ICD-10-CM | POA: Diagnosis not present

## 2022-01-15 DIAGNOSIS — I25118 Atherosclerotic heart disease of native coronary artery with other forms of angina pectoris: Secondary | ICD-10-CM | POA: Diagnosis not present

## 2022-01-15 DIAGNOSIS — I10 Essential (primary) hypertension: Secondary | ICD-10-CM | POA: Diagnosis not present

## 2022-01-15 DIAGNOSIS — Z6841 Body Mass Index (BMI) 40.0 and over, adult: Secondary | ICD-10-CM | POA: Diagnosis not present

## 2022-01-27 DIAGNOSIS — I10 Essential (primary) hypertension: Secondary | ICD-10-CM | POA: Diagnosis not present

## 2022-01-27 DIAGNOSIS — I2510 Atherosclerotic heart disease of native coronary artery without angina pectoris: Secondary | ICD-10-CM | POA: Diagnosis not present

## 2022-01-27 DIAGNOSIS — E785 Hyperlipidemia, unspecified: Secondary | ICD-10-CM | POA: Diagnosis not present

## 2022-01-27 DIAGNOSIS — I252 Old myocardial infarction: Secondary | ICD-10-CM | POA: Diagnosis not present

## 2022-02-26 DIAGNOSIS — R600 Localized edema: Secondary | ICD-10-CM | POA: Diagnosis not present

## 2022-02-26 DIAGNOSIS — I5032 Chronic diastolic (congestive) heart failure: Secondary | ICD-10-CM | POA: Diagnosis not present

## 2022-02-26 DIAGNOSIS — I2510 Atherosclerotic heart disease of native coronary artery without angina pectoris: Secondary | ICD-10-CM | POA: Diagnosis not present

## 2022-02-26 DIAGNOSIS — I7090 Unspecified atherosclerosis: Secondary | ICD-10-CM | POA: Diagnosis not present

## 2022-02-26 DIAGNOSIS — I252 Old myocardial infarction: Secondary | ICD-10-CM | POA: Diagnosis not present

## 2022-02-26 DIAGNOSIS — Z1331 Encounter for screening for depression: Secondary | ICD-10-CM | POA: Diagnosis not present

## 2022-02-26 DIAGNOSIS — Z0000 Encounter for general adult medical examination without abnormal findings: Secondary | ICD-10-CM | POA: Diagnosis not present

## 2022-02-26 DIAGNOSIS — E11319 Type 2 diabetes mellitus with unspecified diabetic retinopathy without macular edema: Secondary | ICD-10-CM | POA: Diagnosis not present

## 2022-02-26 DIAGNOSIS — Z6841 Body Mass Index (BMI) 40.0 and over, adult: Secondary | ICD-10-CM | POA: Diagnosis not present

## 2022-02-26 DIAGNOSIS — I10 Essential (primary) hypertension: Secondary | ICD-10-CM | POA: Diagnosis not present

## 2022-02-26 DIAGNOSIS — E1169 Type 2 diabetes mellitus with other specified complication: Secondary | ICD-10-CM | POA: Diagnosis not present

## 2022-04-30 ENCOUNTER — Other Ambulatory Visit: Payer: Self-pay | Admitting: Family Medicine

## 2022-04-30 DIAGNOSIS — E11319 Type 2 diabetes mellitus with unspecified diabetic retinopathy without macular edema: Secondary | ICD-10-CM

## 2022-05-28 ENCOUNTER — Ambulatory Visit: Payer: Medicare PPO

## 2022-06-05 DIAGNOSIS — E113593 Type 2 diabetes mellitus with proliferative diabetic retinopathy without macular edema, bilateral: Secondary | ICD-10-CM | POA: Diagnosis not present

## 2022-06-05 DIAGNOSIS — H3341 Traction detachment of retina, right eye: Secondary | ICD-10-CM | POA: Diagnosis not present

## 2022-06-05 DIAGNOSIS — H540X33 Blindness right eye category 3, blindness left eye category 3: Secondary | ICD-10-CM | POA: Diagnosis not present

## 2022-06-05 DIAGNOSIS — H3589 Other specified retinal disorders: Secondary | ICD-10-CM | POA: Diagnosis not present

## 2022-06-11 DIAGNOSIS — E1122 Type 2 diabetes mellitus with diabetic chronic kidney disease: Secondary | ICD-10-CM | POA: Diagnosis not present

## 2022-06-11 DIAGNOSIS — F4321 Adjustment disorder with depressed mood: Secondary | ICD-10-CM | POA: Diagnosis not present

## 2022-06-11 DIAGNOSIS — E559 Vitamin D deficiency, unspecified: Secondary | ICD-10-CM | POA: Diagnosis not present

## 2022-06-11 DIAGNOSIS — N1831 Chronic kidney disease, stage 3a: Secondary | ICD-10-CM | POA: Diagnosis not present

## 2022-06-11 DIAGNOSIS — R059 Cough, unspecified: Secondary | ICD-10-CM | POA: Diagnosis not present

## 2022-06-11 DIAGNOSIS — F331 Major depressive disorder, recurrent, moderate: Secondary | ICD-10-CM | POA: Diagnosis not present

## 2022-07-28 ENCOUNTER — Other Ambulatory Visit: Payer: Self-pay

## 2022-07-28 MED ORDER — SITAGLIPTIN PHOSPHATE 100 MG PO TABS: 100.0000 mg | ORAL_TABLET | Freq: Every day | ORAL | 3 refills | Status: DC

## 2022-08-20 ENCOUNTER — Other Ambulatory Visit: Payer: Self-pay | Admitting: Internal Medicine

## 2022-08-20 MED ORDER — ATORVASTATIN CALCIUM 40 MG PO TABS: 40.0000 mg | ORAL_TABLET | Freq: Every day | ORAL | 1 refills | Status: DC

## 2022-08-20 MED ORDER — CLOPIDOGREL BISULFATE 75 MG PO TABS: 75.0000 mg | ORAL_TABLET | Freq: Every morning | ORAL | 1 refills | Status: DC

## 2022-09-11 ENCOUNTER — Ambulatory Visit: Payer: Medicare PPO | Admitting: Internal Medicine

## 2022-09-11 ENCOUNTER — Encounter: Payer: Self-pay | Admitting: Internal Medicine

## 2022-09-11 VITALS — BP 124/70 | HR 58 | Temp 97.3°F | Resp 18 | Ht 61.0 in | Wt 222.2 lb

## 2022-09-11 DIAGNOSIS — L309 Dermatitis, unspecified: Secondary | ICD-10-CM | POA: Diagnosis not present

## 2022-09-11 DIAGNOSIS — F331 Major depressive disorder, recurrent, moderate: Secondary | ICD-10-CM | POA: Diagnosis not present

## 2022-09-11 DIAGNOSIS — E669 Obesity, unspecified: Secondary | ICD-10-CM

## 2022-09-11 DIAGNOSIS — I10 Essential (primary) hypertension: Secondary | ICD-10-CM

## 2022-09-11 DIAGNOSIS — E1169 Type 2 diabetes mellitus with other specified complication: Secondary | ICD-10-CM

## 2022-09-11 DIAGNOSIS — E785 Hyperlipidemia, unspecified: Secondary | ICD-10-CM | POA: Insufficient documentation

## 2022-09-11 DIAGNOSIS — I25118 Atherosclerotic heart disease of native coronary artery with other forms of angina pectoris: Secondary | ICD-10-CM

## 2022-09-11 MED ORDER — FREESTYLE LIBRE 14 DAY SENSOR MISC: 1.0000 | 6 refills | Status: DC

## 2022-09-11 MED ORDER — FREESTYLE LIBRE 3 READER DEVI
1.0000 | 6 refills | Status: DC
Start: 2022-09-11 — End: 2022-11-17

## 2022-09-11 MED ORDER — TRIAMCINOLONE ACETONIDE 0.5 % EX OINT: 1.0000 | TOPICAL_OINTMENT | Freq: Two times a day (BID) | CUTANEOUS | 0 refills | Status: AC

## 2022-09-11 MED ORDER — GLIMEPIRIDE 2 MG PO TABS: 2.0000 mg | ORAL_TABLET | ORAL | 11 refills | Status: DC

## 2022-09-11 MED ORDER — ESCITALOPRAM OXALATE 10 MG PO TABS: 10.0000 mg | ORAL_TABLET | Freq: Every day | ORAL | 5 refills | Status: DC

## 2022-09-11 NOTE — Assessment & Plan Note (Signed)
With stable angina and sometimes you take nitroglycerin.  He will continue to follow-up with cardiologist.  She was taking both aspirin and Plavix and I have advised her just to take Plavix once a day.

## 2022-09-11 NOTE — Progress Notes (Addendum)
Office Visit  Subjective   Patient ID: Elizabeth [name]   DOB: [birth year]   Age: 88 y.o.   MRN: [id number]   Chief Complaint Chief Complaint  Patient presents with   Hypertension    Myocardial infarction   Follow-up     History of Present Illness 88 years old female who is here for follow up. Her husband died 26 months ago. She says she is under so much stress. She says her daughters are near by and come to visit her.  Patient says that she cried a lot.  Initially he could not sleep but now she is able to sleep.  She admits that she is depressed.  She does not have any suicidal ideation.  She goes to church to get some help. She also has uncontrolled diabetes and last hemoglobin A1c in October was 9%.  She takes Januvia and metformin 1 tablets daily.  She does not check her sugar.  She tells me that she does not care anymore.  She has bilateral poor vision and follows with eye doctor.  Since blood sugar is uncontrolled she says that blurry vision is also worse. Has coronary artery disease status post angioplasty and stent placement.  She follows with cardiologist once a year.  She denies having any chest pain.  She also has hyperlipidemia and takes atorvastatin 40 mg daily.  She denies having any side effect.  Her last lipid panel done July last year shows LDL in 72s. She also gets swelling in the leg and started noticing rash on the left lower leg.  It does not itch.  It does not hurt.  She used pillow at nighttime and swelling is better and when swelling is more daughter gave her Lasix.   Past Medical History 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   CAD (coronary artery disease)    Diabetes mellitus    type II   Diabetic retinopathy    Dr Shea Stakes @ [facility] (vitrectomy left eye)   Gastroenteritis 02/13/2013   History of GI bleed    secondary to heparin   Hypertension    Right hip pain 02/13/2013   Shingles     Tubal pregnancy      Allergies Allergies  Allergen Reactions   Sulfa Antibiotics Hives   Cyclobenzaprine Hcl Rash     Review of Systems Review of Systems  Constitutional: Negative.   Eyes:  Positive for blurred vision.  Cardiovascular: Negative.   Gastrointestinal: Negative.   Skin:  Positive for rash.  Psychiatric/Behavioral:  Positive for depression. Negative for suicidal ideas.        Objective:    Vitals BP 124/70 (BP Location: Left Arm, Patient Position: Sitting, Cuff Size: Normal)   Pulse (!) 58   Temp (!) 97.3 F (36.3 C)   Resp 18   Ht 5\' 1"  (1.549 m)   Wt 222 lb 4 oz (100.8 kg)   SpO2 95%   BMI 41.99 kg/m    Physical Examination Physical Exam Constitutional:      Appearance: She is obese.  HENT:     Head: Normocephalic and atraumatic.  Eyes:     Extraocular Movements: Extraocular movements intact.     Pupils: Pupils are equal, round, and reactive to light.  Cardiovascular:     Rate and Rhythm: Normal rate and regular rhythm.     Heart sounds: Normal heart sounds.  Pulmonary:     Breath sounds: Normal breath sounds.  Abdominal:     General: Bowel sounds are normal.     Palpations: Abdomen is soft.  Neurological:     General: No focal deficit present.     Mental Status: She is alert and oriented to person, place, and time.        Assessment & Plan:   Essential hypertension Controlled.  Coronary artery disease of native heart with stable angina pectoris ([place]) With stable angina and sometimes you take nitroglycerin.  He will continue to follow-up with cardiologist.  She was taking both aspirin and Plavix and I have advised her just to take Plavix once a day.  Diabetes mellitus type 2 in obese ([place]) Blood sugar is elevated, he does not check her sugar at home either.  I will add Amaryl 2 mg daily and also give her continuous glucose monitor.  Daughter will make sure that she does not have any hypoglycemia.  Dermatitis She will use the elastic  stocking and I will give her steroid cream to apply on that rash.  She will also elevate her feet.  She will continue to take Lasix as needed for swelling.  Moderate episode of recurrent major depressive disorder ([place]) I will start her on escitalopram 10 mg daily and if she has any side effects then she will call. We could not draw blood today.  Return in about 1 month (around 10/12/2022).   Garwin Brothers, MD

## 2022-09-11 NOTE — Addendum Note (Signed)
Addended byGarwin Brothers on: 09/11/2022 11:07 AM   Modules accepted: Orders

## 2022-09-11 NOTE — Assessment & Plan Note (Signed)
She will use the elastic stocking and I will give her steroid cream to apply on that rash.  She will also elevate her feet.  She will continue to take Lasix as needed for swelling.

## 2022-09-11 NOTE — Assessment & Plan Note (Signed)
Blood sugar is elevated, he does not check her sugar at home either.  I will add Amaryl 2 mg daily and also give her continuous glucose monitor.  Daughter will make sure that she does not have any hypoglycemia.

## 2022-09-11 NOTE — Assessment & Plan Note (Signed)
I will start her on escitalopram 10 mg daily and if she has any side effects then she will call.

## 2022-09-11 NOTE — Assessment & Plan Note (Signed)
Controlled.  

## 2022-10-14 ENCOUNTER — Ambulatory Visit: Payer: Medicare PPO | Admitting: Internal Medicine

## 2022-11-17 ENCOUNTER — Other Ambulatory Visit: Payer: Self-pay

## 2022-11-17 ENCOUNTER — Telehealth: Payer: Self-pay | Admitting: Internal Medicine

## 2022-11-17 MED ORDER — FREESTYLE LIBRE 3 READER DEVI
1.0000 | 6 refills | Status: AC
Start: 2022-11-17 — End: ?

## 2022-11-17 MED ORDER — FREESTYLE LIBRE 14 DAY SENSOR MISC: 1.0000 | 6 refills | Status: DC

## 2022-11-17 NOTE — Telephone Encounter (Signed)
Can you please have Dr. Reesa Chew send Freestyle Libre 3 Kit and Freestyle [place] 3 sensor to [facility] in [place]

## 2022-11-18 ENCOUNTER — Ambulatory Visit: Payer: Medicare PPO | Admitting: Internal Medicine

## 2022-11-18 ENCOUNTER — Other Ambulatory Visit: Payer: Self-pay

## 2022-11-18 MED ORDER — FREESTYLE LIBRE 3 SENSOR MISC: 3 refills | Status: AC

## 2022-11-30 ENCOUNTER — Other Ambulatory Visit: Payer: Self-pay | Admitting: Family Medicine

## 2022-11-30 DIAGNOSIS — E11319 Type 2 diabetes mellitus with unspecified diabetic retinopathy without macular edema: Secondary | ICD-10-CM

## 2022-12-02 ENCOUNTER — Other Ambulatory Visit: Payer: Self-pay

## 2022-12-02 DIAGNOSIS — E11319 Type 2 diabetes mellitus with unspecified diabetic retinopathy without macular edema: Secondary | ICD-10-CM

## 2022-12-02 MED ORDER — CANAGLIFLOZIN 300 MG PO TABS: ORAL_TABLET | ORAL | 5 refills | Status: DC

## 2022-12-30 ENCOUNTER — Ambulatory Visit: Payer: Medicare PPO | Admitting: Internal Medicine

## 2023-01-13 ENCOUNTER — Ambulatory Visit: Payer: Medicare PPO | Admitting: Internal Medicine

## 2023-01-14 ENCOUNTER — Other Ambulatory Visit: Payer: Self-pay

## 2023-01-14 MED ORDER — METOPROLOL TARTRATE 50 MG PO TABS
25.0000 mg | ORAL_TABLET | Freq: Two times a day (BID) | ORAL | 1 refills | Status: DC
Start: 2023-01-14 — End: 2023-07-08

## 2023-01-15 DIAGNOSIS — L304 Erythema intertrigo: Secondary | ICD-10-CM | POA: Diagnosis not present

## 2023-01-15 DIAGNOSIS — Z85828 Personal history of other malignant neoplasm of skin: Secondary | ICD-10-CM | POA: Diagnosis not present

## 2023-01-15 DIAGNOSIS — L578 Other skin changes due to chronic exposure to nonionizing radiation: Secondary | ICD-10-CM | POA: Diagnosis not present

## 2023-01-15 DIAGNOSIS — D1801 Hemangioma of skin and subcutaneous tissue: Secondary | ICD-10-CM | POA: Diagnosis not present

## 2023-01-15 DIAGNOSIS — I781 Nevus, non-neoplastic: Secondary | ICD-10-CM | POA: Diagnosis not present

## 2023-01-15 DIAGNOSIS — L821 Other seborrheic keratosis: Secondary | ICD-10-CM | POA: Diagnosis not present

## 2023-01-15 DIAGNOSIS — I872 Venous insufficiency (chronic) (peripheral): Secondary | ICD-10-CM | POA: Diagnosis not present

## 2023-01-15 DIAGNOSIS — L814 Other melanin hyperpigmentation: Secondary | ICD-10-CM | POA: Diagnosis not present

## 2023-01-29 DIAGNOSIS — E785 Hyperlipidemia, unspecified: Secondary | ICD-10-CM | POA: Diagnosis not present

## 2023-01-29 DIAGNOSIS — I2510 Atherosclerotic heart disease of native coronary artery without angina pectoris: Secondary | ICD-10-CM | POA: Diagnosis not present

## 2023-01-29 DIAGNOSIS — Z955 Presence of coronary angioplasty implant and graft: Secondary | ICD-10-CM | POA: Diagnosis not present

## 2023-01-29 DIAGNOSIS — I10 Essential (primary) hypertension: Secondary | ICD-10-CM | POA: Diagnosis not present

## 2023-01-29 DIAGNOSIS — I252 Old myocardial infarction: Secondary | ICD-10-CM | POA: Diagnosis not present

## 2023-02-22 ENCOUNTER — Other Ambulatory Visit: Payer: Self-pay | Admitting: Internal Medicine

## 2023-02-22 DIAGNOSIS — E11319 Type 2 diabetes mellitus with unspecified diabetic retinopathy without macular edema: Secondary | ICD-10-CM

## 2023-02-27 ENCOUNTER — Other Ambulatory Visit: Payer: Self-pay

## 2023-03-02 ENCOUNTER — Other Ambulatory Visit: Payer: Self-pay

## 2023-03-02 MED ORDER — CLOPIDOGREL BISULFATE 75 MG PO TABS: 75.0000 mg | ORAL_TABLET | Freq: Every morning | ORAL | 1 refills | Status: DC

## 2023-03-02 MED ORDER — ATORVASTATIN CALCIUM 40 MG PO TABS: 40.0000 mg | ORAL_TABLET | Freq: Every day | ORAL | 1 refills | Status: DC

## 2023-03-09 ENCOUNTER — Ambulatory Visit: Payer: Medicare PPO | Admitting: Student

## 2023-03-09 ENCOUNTER — Encounter: Payer: Self-pay | Admitting: Student

## 2023-03-09 VITALS — BP 122/72 | HR 65 | Temp 97.3°F | Resp 18 | Ht 61.0 in | Wt 233.0 lb

## 2023-03-09 DIAGNOSIS — E1169 Type 2 diabetes mellitus with other specified complication: Secondary | ICD-10-CM | POA: Diagnosis not present

## 2023-03-09 DIAGNOSIS — F331 Major depressive disorder, recurrent, moderate: Secondary | ICD-10-CM

## 2023-03-09 DIAGNOSIS — I10 Essential (primary) hypertension: Secondary | ICD-10-CM

## 2023-03-09 DIAGNOSIS — E669 Obesity, unspecified: Secondary | ICD-10-CM

## 2023-03-09 MED ORDER — ESCITALOPRAM OXALATE 20 MG PO TABS: 20.0000 mg | ORAL_TABLET | Freq: Every day | ORAL | 3 refills | Status: DC

## 2023-03-09 NOTE — Assessment & Plan Note (Signed)
I will increase lexapro to 20 mg daily. She will follow up.

## 2023-03-09 NOTE — Assessment & Plan Note (Signed)
Blood pressure is in acceptable range. No changes made today. I will bring her back for lab and urine collections.

## 2023-03-09 NOTE — Progress Notes (Signed)
Established Patient Office Visit  Subjective   Patient ID: Elizabeth [name], female    DOB: [birth year]  Age: 88 y.o. MRN: [id number]  Chief Complaint  Patient presents with   Follow-up    Follow up     Elizabeth [name] is a 88 year old female here for a follow up HTN, DM II, and moderate depressive disorder. She is here with her daughter (HPOA).   Hypertension: Has coronary artery disease status post angioplasty and stent placement.  She follows with cardiologist once a year. My attending saw her in January 2024 at that time she was taken off of Asprin 81 mg daily and a blood pressure medications. Recently she saw her cardiologist who placed her back on therapies. She says that her blood pressure increased so she is now taking 5 mg of amalodpine daily, she says she is tolerating this better than the 160 mg of valsartan because the amlodipine does not wipe her out. She is using Lasix 40 mg as needed for leg swelling. Taking this intermittently, as some weeks she needs does not need it; using 2 doses max during the weeks. She is still taking  atorvastatin 40 mg. She denies chest pain, shortness of breath, myalgias or dark urine.    Diabtese type II: She also has uncontrolled diabetes and last hemoglobin A1c in October was 9%. The patients daughter says the patient is not taking glimepiride 2 MG as prescribed in January because her pharmacy and insurance company said that the patient should not be taking this medication at her age due the the possiblilty of hypoglycemic events. She only used 1 dose when her glucose monitor alerted her that the glucose was dropping too fast. She says she felt wiped out. She is supposed to be monitoring her glucose with Freestyle Libre sensor kit but had some issues with the device. She is taking metformin 500 mg once a day at [facility] , canaglifloxzin 300 mg and Januvia 100 mg daily. Glucose readings have been between 140-150. She has bilateral poor vision and  follows with eye doctor. She denies hypoglycemia, polyphagia, polydipsia, or polyuria.   MDD: She says that she crys every day since losing her son, husband, and her childhood very best friend all close together. The patient says that she is receiving support from her church and her family. Her daughter says that the family has noticed a change improved mood since beginning lexapro 10 mg. She is getting outside and walking with her granddaughters. Her daughter says there are still a lot of times that the patient does not want to go out but instead wants to be home.  Elizabeth [name] endorses having panic attacks when she is crying but she says they don't last long. She was taken off the vitamin D last level was drawn 10 years ago. She needs her labs updated.     Review of Systems  Constitutional: Negative.   Eyes:  Positive for blurred vision.  Respiratory: Negative.    Cardiovascular:  Positive for leg swelling. Negative for chest pain, palpitations and orthopnea.  Gastrointestinal:  Positive for diarrhea. Negative for abdominal pain, constipation, melena, nausea and vomiting.  Genitourinary: Negative.   Musculoskeletal: Negative.   Skin: Negative.   Neurological: Negative.   Psychiatric/Behavioral:  Positive for depression.       Objective:     BP 122/72 (BP Location: Left Arm, Patient Position: Sitting, Cuff Size: Normal)   Pulse 65   Temp (!) 97.3 F (36.3 C)  Resp 18   Ht 5\' 1"  (1.549 m)   Wt 233 lb (105.7 kg)   SpO2 95%   BMI 44.02 kg/m    Physical Exam Vitals reviewed.  Constitutional:      Appearance: Normal appearance. She is obese.  Cardiovascular:     Rate and Rhythm: Normal rate and regular rhythm.     Pulses: Normal pulses.     Heart sounds: Normal heart sounds. No murmur heard. Pulmonary:     Effort: Pulmonary effort is normal.     Breath sounds: Normal breath sounds.  Abdominal:     General: Abdomen is flat. Bowel sounds are normal.     Palpations: Abdomen is  soft.  Musculoskeletal:        General: Normal range of motion.     Right lower leg: Edema present.     Left lower leg: Edema present.  Skin:    General: Skin is warm and dry.     Capillary Refill: Capillary refill takes less than 2 seconds.  Neurological:     Mental Status: She is alert and oriented to person, place, and time. Mental status is at baseline.  Psychiatric:        Mood and Affect: Mood normal.        Behavior: Behavior normal.     The ASCVD Risk score (Arnett DK, et al., 2019) failed to calculate for the following reasons:   The 2019 ASCVD risk score is only valid for ages 53 to 51   The patient has a prior MI or stroke diagnosis    Assessment & Plan:   Problem List Items Addressed This Visit     Type 2 diabetes mellitus with obesity (HCC)    Labs to be updated for evaluation.       Essential hypertension - Primary    Blood pressure is in acceptable range. No changes made today. I will bring her back for lab and urine collections.       Moderate episode of recurrent major depressive disorder (HCC)    I will increase lexapro to 20 mg daily. She will follow up.       Relevant Medications   escitalopram (LEXAPRO) 20 MG tablet    Return in about 2 weeks (around 03/23/2023) for nurse visit blood work .    Edwena Blow, NP

## 2023-03-09 NOTE — Assessment & Plan Note (Signed)
Labs to be updated for evaluation.

## 2023-03-20 ENCOUNTER — Ambulatory Visit: Payer: Medicare PPO | Admitting: Student

## 2023-03-20 ENCOUNTER — Other Ambulatory Visit: Payer: Self-pay | Admitting: Student

## 2023-03-20 DIAGNOSIS — E785 Hyperlipidemia, unspecified: Secondary | ICD-10-CM | POA: Diagnosis not present

## 2023-03-20 DIAGNOSIS — I25118 Atherosclerotic heart disease of native coronary artery with other forms of angina pectoris: Secondary | ICD-10-CM

## 2023-03-20 DIAGNOSIS — F331 Major depressive disorder, recurrent, moderate: Secondary | ICD-10-CM

## 2023-03-20 DIAGNOSIS — E559 Vitamin D deficiency, unspecified: Secondary | ICD-10-CM | POA: Diagnosis not present

## 2023-03-20 DIAGNOSIS — I10 Essential (primary) hypertension: Secondary | ICD-10-CM | POA: Diagnosis not present

## 2023-03-23 LAB — CMP14 + ANION GAP
ALT: 15 IU/L (ref 0–32)
AST: 15 IU/L (ref 0–40)
Albumin: 3.8 g/dL (ref 3.7–4.7)
Alkaline Phosphatase: 66 IU/L (ref 44–121)
Anion Gap: 15 mmol/L (ref 10.0–18.0)
BUN/Creatinine Ratio: 25 (ref 12–28)
BUN: 34 mg/dL — ABNORMAL HIGH (ref 8–27)
Bilirubin Total: 0.4 mg/dL (ref 0.0–1.2)
CO2: 21 mmol/L (ref 20–29)
Calcium: 9.3 mg/dL (ref 8.7–10.3)
Chloride: 105 mmol/L (ref 96–106)
Creatinine, Ser: 1.37 mg/dL — ABNORMAL HIGH (ref 0.57–1.00)
Globulin, Total: 2.1 g/dL (ref 1.5–4.5)
Glucose: 206 mg/dL — ABNORMAL HIGH (ref 70–99)
Potassium: 5.4 mmol/L — ABNORMAL HIGH (ref 3.5–5.2)
Sodium: 141 mmol/L (ref 134–144)
Total Protein: 5.9 g/dL — ABNORMAL LOW (ref 6.0–8.5)
eGFR: 38 mL/min/{1.73_m2} — ABNORMAL LOW (ref >59)

## 2023-03-23 LAB — CBC WITH DIFFERENTIAL/PLATELET
Basophils Absolute: 0.1 10*3/uL (ref 0.0–0.2)
Basos: 1 %
EOS (ABSOLUTE): 0.2 10*3/uL (ref 0.0–0.4)
Eos: 3 %
Hematocrit: 41.7 % (ref 34.0–46.6)
Hemoglobin: 14 g/dL (ref 11.1–15.9)
Immature Grans (Abs): 0.1 10*3/uL (ref 0.0–0.1)
Immature Granulocytes: 1 %
Lymphocytes Absolute: 1.9 10*3/uL (ref 0.7–3.1)
Lymphs: 20 %
MCH: 32.3 pg (ref 26.6–33.0)
MCHC: 33.6 g/dL (ref 31.5–35.7)
MCV: 96 fL (ref 79–97)
Monocytes Absolute: 0.8 10*3/uL (ref 0.1–0.9)
Monocytes: 8 %
Neutrophils Absolute: 6.2 10*3/uL (ref 1.4–7.0)
Neutrophils: 67 %
Platelets: 256 10*3/uL (ref 150–450)
RBC: 4.33 x10E6/uL (ref 3.77–5.28)
RDW: 13.2 % (ref 11.7–15.4)
WBC: 9.1 10*3/uL (ref 3.4–10.8)

## 2023-03-23 LAB — LIPID PANEL
Chol/HDL Ratio: 2.1 ratio (ref 0.0–4.4)
Cholesterol, Total: 102 mg/dL (ref 100–199)
HDL: 48 mg/dL (ref >39)
LDL Chol Calc (NIH): 37 mg/dL (ref 0–99)
Triglycerides: 83 mg/dL (ref 0–149)
VLDL Cholesterol Cal: 17 mg/dL (ref 5–40)

## 2023-03-23 LAB — HEMOGLOBIN A1C
Est. average glucose Bld gHb Est-mCnc: 266 mg/dL
Hgb A1c MFr Bld: 10.9 % — ABNORMAL HIGH (ref 4.8–5.6)

## 2023-03-23 LAB — VITAMIN D 25 HYDROXY (VIT D DEFICIENCY, FRACTURES): Vit D, 25-Hydroxy: 17.1 ng/mL — ABNORMAL LOW (ref 30.0–100.0)

## 2023-03-23 LAB — MICROALBUMIN / CREATININE URINE RATIO
Creatinine, Urine: 47.6 mg/dL
Microalb/Creat Ratio: 103 mg/g{creat} — ABNORMAL HIGH (ref 0–29)
Microalbumin, Urine: 48.9 ug/mL

## 2023-03-25 ENCOUNTER — Other Ambulatory Visit: Payer: Self-pay | Admitting: Student

## 2023-03-25 MED ORDER — SEMAGLUTIDE(0.25 OR 0.5MG/DOS) 2 MG/3ML ~~LOC~~ SOPN: 0.2500 mg | PEN_INJECTOR | SUBCUTANEOUS | Status: DC

## 2023-03-25 MED ORDER — METFORMIN HCL ER (OSM) 1000 MG PO TB24: 1000.0000 mg | ORAL_TABLET | Freq: Two times a day (BID) | ORAL | 0 refills | Status: DC

## 2023-03-25 MED ORDER — SEMAGLUTIDE(0.25 OR 0.5MG/DOS) 2 MG/3ML ~~LOC~~ SOPN: 0.5000 mg | PEN_INJECTOR | SUBCUTANEOUS | 0 refills | Status: DC

## 2023-03-25 NOTE — Progress Notes (Signed)
I have reviewed the results of your recent test and lab work that you underwent at our clinic. Findings indicate dehydration, uncontrolled diabetes and vitamin D insuffiencey.   Based on the test results, I would like to recommend the following course of action to address any health concern: you need to drink plenty of fluids. Decrease Lasix to 20mg  (cut pill in half). Stop taking Invokana, Amaryl, and Januvia and begin taking Metformin 1000 mg twice daily and Ozempic injections 0.25 for 4 weeks and then 0.5 mg. Take vitamin D 2000iu daily for 3 months.   We have samples and can show you how to place the injections here in the office. I need to see you back in 1 weeks for repeat labs from there 3 months follow up.

## 2023-04-10 ENCOUNTER — Ambulatory Visit: Payer: Medicare PPO | Admitting: Internal Medicine

## 2023-04-10 DIAGNOSIS — I25118 Atherosclerotic heart disease of native coronary artery with other forms of angina pectoris: Secondary | ICD-10-CM

## 2023-04-10 NOTE — Progress Notes (Signed)
Nurse visit cmp

## 2023-04-11 LAB — CMP14 + ANION GAP
ALT: 13 IU/L (ref 0–32)
AST: 18 IU/L (ref 0–40)
Albumin: 3.6 g/dL — ABNORMAL LOW (ref 3.7–4.7)
Alkaline Phosphatase: 57 IU/L (ref 44–121)
Anion Gap: 13 mmol/L (ref 10.0–18.0)
BUN/Creatinine Ratio: 21 (ref 12–28)
BUN: 19 mg/dL (ref 8–27)
Bilirubin Total: 0.4 mg/dL (ref 0.0–1.2)
CO2: 24 mmol/L (ref 20–29)
Calcium: 8.6 mg/dL — ABNORMAL LOW (ref 8.7–10.3)
Chloride: 103 mmol/L (ref 96–106)
Creatinine, Ser: 0.9 mg/dL (ref 0.57–1.00)
Globulin, Total: 2 g/dL (ref 1.5–4.5)
Glucose: 203 mg/dL — ABNORMAL HIGH (ref 70–99)
Potassium: 5.2 mmol/L (ref 3.5–5.2)
Sodium: 140 mmol/L (ref 134–144)
Total Protein: 5.6 g/dL — ABNORMAL LOW (ref 6.0–8.5)
eGFR: 63 mL/min/{1.73_m2} (ref >59)

## 2023-04-24 DIAGNOSIS — M79671 Pain in right foot: Secondary | ICD-10-CM | POA: Diagnosis not present

## 2023-05-04 DIAGNOSIS — I252 Old myocardial infarction: Secondary | ICD-10-CM | POA: Diagnosis not present

## 2023-05-04 DIAGNOSIS — I10 Essential (primary) hypertension: Secondary | ICD-10-CM | POA: Diagnosis not present

## 2023-05-04 DIAGNOSIS — E785 Hyperlipidemia, unspecified: Secondary | ICD-10-CM | POA: Diagnosis not present

## 2023-05-04 DIAGNOSIS — I2510 Atherosclerotic heart disease of native coronary artery without angina pectoris: Secondary | ICD-10-CM | POA: Diagnosis not present

## 2023-05-08 ENCOUNTER — Other Ambulatory Visit: Payer: Self-pay | Admitting: Student

## 2023-05-28 DIAGNOSIS — E113593 Type 2 diabetes mellitus with proliferative diabetic retinopathy without macular edema, bilateral: Secondary | ICD-10-CM | POA: Diagnosis not present

## 2023-05-28 DIAGNOSIS — H3589 Other specified retinal disorders: Secondary | ICD-10-CM | POA: Diagnosis not present

## 2023-05-28 DIAGNOSIS — H540X33 Blindness right eye category 3, blindness left eye category 3: Secondary | ICD-10-CM | POA: Diagnosis not present

## 2023-05-28 DIAGNOSIS — H3341 Traction detachment of retina, right eye: Secondary | ICD-10-CM | POA: Diagnosis not present

## 2023-05-28 DIAGNOSIS — Z7984 Long term (current) use of oral hypoglycemic drugs: Secondary | ICD-10-CM | POA: Diagnosis not present

## 2023-06-14 ENCOUNTER — Other Ambulatory Visit: Payer: Self-pay | Admitting: Internal Medicine

## 2023-06-15 ENCOUNTER — Other Ambulatory Visit: Payer: Self-pay

## 2023-07-07 ENCOUNTER — Other Ambulatory Visit: Payer: Self-pay | Admitting: Internal Medicine

## 2023-07-07 ENCOUNTER — Other Ambulatory Visit: Payer: Self-pay | Admitting: Student

## 2023-07-13 ENCOUNTER — Other Ambulatory Visit: Payer: Self-pay | Admitting: Internal Medicine

## 2023-08-18 ENCOUNTER — Other Ambulatory Visit: Payer: Self-pay | Admitting: Internal Medicine

## 2023-09-15 ENCOUNTER — Other Ambulatory Visit: Payer: Self-pay | Admitting: Internal Medicine

## 2023-09-29 ENCOUNTER — Other Ambulatory Visit: Payer: Self-pay | Admitting: Internal Medicine

## 2023-10-05 ENCOUNTER — Other Ambulatory Visit: Payer: Self-pay | Admitting: Internal Medicine

## 2023-10-13 ENCOUNTER — Other Ambulatory Visit: Payer: Self-pay | Admitting: Internal Medicine

## 2023-11-01 ENCOUNTER — Other Ambulatory Visit: Payer: Self-pay | Admitting: Internal Medicine

## 2023-11-10 ENCOUNTER — Other Ambulatory Visit: Payer: Self-pay | Admitting: Internal Medicine

## 2023-12-03 DIAGNOSIS — I10 Essential (primary) hypertension: Secondary | ICD-10-CM | POA: Diagnosis not present

## 2023-12-03 DIAGNOSIS — E785 Hyperlipidemia, unspecified: Secondary | ICD-10-CM | POA: Diagnosis not present

## 2023-12-03 DIAGNOSIS — I252 Old myocardial infarction: Secondary | ICD-10-CM | POA: Diagnosis not present

## 2023-12-03 DIAGNOSIS — I2510 Atherosclerotic heart disease of native coronary artery without angina pectoris: Secondary | ICD-10-CM | POA: Diagnosis not present

## 2023-12-03 DIAGNOSIS — Z955 Presence of coronary angioplasty implant and graft: Secondary | ICD-10-CM | POA: Diagnosis not present

## 2023-12-08 ENCOUNTER — Other Ambulatory Visit: Payer: Self-pay | Admitting: Internal Medicine

## 2023-12-31 DIAGNOSIS — M25562 Pain in left knee: Secondary | ICD-10-CM | POA: Diagnosis not present

## 2024-01-03 ENCOUNTER — Other Ambulatory Visit: Payer: Self-pay | Admitting: Internal Medicine

## 2024-01-05 ENCOUNTER — Other Ambulatory Visit: Payer: Self-pay | Admitting: Internal Medicine

## 2024-01-08 ENCOUNTER — Encounter: Payer: Self-pay | Admitting: Internal Medicine

## 2024-01-08 ENCOUNTER — Ambulatory Visit: Admitting: Internal Medicine

## 2024-01-08 VITALS — BP 124/74 | HR 83 | Temp 97.8°F | Resp 18 | Ht 61.0 in | Wt 233.0 lb

## 2024-01-08 DIAGNOSIS — J069 Acute upper respiratory infection, unspecified: Secondary | ICD-10-CM | POA: Diagnosis not present

## 2024-01-08 DIAGNOSIS — E1169 Type 2 diabetes mellitus with other specified complication: Secondary | ICD-10-CM | POA: Diagnosis not present

## 2024-01-08 DIAGNOSIS — E669 Obesity, unspecified: Secondary | ICD-10-CM | POA: Diagnosis not present

## 2024-01-08 DIAGNOSIS — R052 Subacute cough: Secondary | ICD-10-CM | POA: Diagnosis not present

## 2024-01-08 LAB — POC COVID19 BINAXNOW: SARS Coronavirus 2 Ag: NEGATIVE

## 2024-01-08 MED ORDER — OXYMETAZOLINE HCL 0.05 % NA SOLN: 1.0000 | Freq: Two times a day (BID) | NASAL | 0 refills | Status: DC

## 2024-01-08 MED ORDER — GUAIFENESIN-CODEINE 100-10 MG/5ML PO SOLN: 10.0000 mL | Freq: Three times a day (TID) | ORAL | 0 refills | Status: DC | PRN

## 2024-01-08 NOTE — Assessment & Plan Note (Signed)
 She will come back in 2 weeks for evaluation of diabetes and hypertension.

## 2024-01-08 NOTE — Progress Notes (Signed)
   Office Visit  Subjective   Patient ID: Elizabeth [name]   DOB: 11-Jan-1935   Age: 89 y.o.   MRN: [id number]   Chief Complaint Chief Complaint  Patient presents with   office visit    Patient here for coughing fro 3 days , no fever      History of Present Illness   89 years old female who is here complaining of cough and chest congestion for 3 days.  No fever or chills.  Daughter noted that she was wheezing and was short of breath when she walked.  No fever or chills.  She says that she could not sleep last night because of cough.  She is not bringing any phlegm out.  Her COVID test is negative.    She has a history of uncontrolled diabetes mellitus.  She says that she does not check her sugar at home.  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   HENT:  Positive for congestion.   Respiratory:  Positive for cough.   Cardiovascular: Negative.   Neurological:  Positive for weakness.       Objective:    Vitals BP 124/74   Pulse 83   Temp 97.8 F (36.6 C)   Resp 18   Ht 5\' 1"  (1.549 m)   Wt 233 lb (105.7 kg)   SpO2 98%   BMI 44.02 kg/m    Physical Examination Physical Exam Constitutional:      Appearance: Normal appearance.  HENT:     Head: Normocephalic and atraumatic.  Cardiovascular:     Rate and Rhythm: Normal rate and regular rhythm.     Heart sounds: Normal heart sounds.  Pulmonary:     Breath sounds: Rales  present.  Neurological:     Mental Status: She is alert.        Assessment & Plan:   URI (upper respiratory infection)   She has upper respiratory infection with postnasal drip.  I will send Afrin drops 1 drop each nose twice a day.  I will also send codeine with Mucinex for cough.  She will drink plenty of water.  If she is not better than she will call.  Her COVID test is negative.  Type 2 diabetes mellitus with obesity (HCC)   She will come back in 2 weeks for evaluation of diabetes and hypertension.    Return in about 2 weeks (around 01/22/2024).   Elizabeth Form, MD

## 2024-01-08 NOTE — Assessment & Plan Note (Signed)
 She has upper respiratory infection with postnasal drip.  I will send Afrin drops 1 drop each nose twice a day.  I will also send codeine with Mucinex for cough.  She will drink plenty of water.  If she is not better than she will call.  Her COVID test is negative.

## 2024-01-14 DIAGNOSIS — M25562 Pain in left knee: Secondary | ICD-10-CM | POA: Diagnosis not present

## 2024-01-19 DIAGNOSIS — I781 Nevus, non-neoplastic: Secondary | ICD-10-CM | POA: Diagnosis not present

## 2024-01-19 DIAGNOSIS — L814 Other melanin hyperpigmentation: Secondary | ICD-10-CM | POA: Diagnosis not present

## 2024-01-19 DIAGNOSIS — D692 Other nonthrombocytopenic purpura: Secondary | ICD-10-CM | POA: Diagnosis not present

## 2024-01-19 DIAGNOSIS — L578 Other skin changes due to chronic exposure to nonionizing radiation: Secondary | ICD-10-CM | POA: Diagnosis not present

## 2024-01-19 DIAGNOSIS — L821 Other seborrheic keratosis: Secondary | ICD-10-CM | POA: Diagnosis not present

## 2024-01-19 DIAGNOSIS — Z85828 Personal history of other malignant neoplasm of skin: Secondary | ICD-10-CM | POA: Diagnosis not present

## 2024-01-19 DIAGNOSIS — D1801 Hemangioma of skin and subcutaneous tissue: Secondary | ICD-10-CM | POA: Diagnosis not present

## 2024-01-26 ENCOUNTER — Ambulatory Visit: Admitting: Internal Medicine

## 2024-02-02 ENCOUNTER — Ambulatory Visit: Admitting: Internal Medicine

## 2024-02-09 ENCOUNTER — Other Ambulatory Visit: Payer: Self-pay | Admitting: Internal Medicine

## 2024-02-23 ENCOUNTER — Ambulatory Visit: Admitting: Internal Medicine

## 2024-03-15 ENCOUNTER — Ambulatory Visit: Admitting: Internal Medicine

## 2024-03-15 VITALS — BP 124/70 | HR 69 | Temp 97.5°F | Resp 18 | Wt 231.2 lb

## 2024-03-15 DIAGNOSIS — I10 Essential (primary) hypertension: Secondary | ICD-10-CM

## 2024-03-15 DIAGNOSIS — I25118 Atherosclerotic heart disease of native coronary artery with other forms of angina pectoris: Secondary | ICD-10-CM

## 2024-03-15 DIAGNOSIS — F331 Major depressive disorder, recurrent, moderate: Secondary | ICD-10-CM

## 2024-03-15 DIAGNOSIS — E785 Hyperlipidemia, unspecified: Secondary | ICD-10-CM

## 2024-03-15 DIAGNOSIS — E1169 Type 2 diabetes mellitus with other specified complication: Secondary | ICD-10-CM

## 2024-03-15 MED ORDER — SITAGLIPTIN PHOSPHATE 100 MG PO TABS: 100.0000 mg | ORAL_TABLET | Freq: Every day | ORAL | 3 refills | Status: DC

## 2024-03-15 MED ORDER — REXULTI 0.5 MG PO TABS: 1.0000 | ORAL_TABLET | Freq: Every day | ORAL | 3 refills | Status: DC

## 2024-03-15 NOTE — Assessment & Plan Note (Signed)
 Her cholesterol is controlled but diabetes is uncontrolled.  I will add Januvia  100 mg and she will continue with metformin  twice a day.  If blood sugar is high I may need to add glimepiride .

## 2024-03-15 NOTE — Assessment & Plan Note (Signed)
Blood pressure is controlled on current medication. 

## 2024-03-15 NOTE — Assessment & Plan Note (Signed)
 She is stable and does not have any chest pain or exertional dyspnea.

## 2024-03-15 NOTE — Assessment & Plan Note (Signed)
 She takes escitalopram  20 mg daily.  I will add Rexulti  0.5 mg daily as adjunct therapy for depression.

## 2024-03-15 NOTE — Progress Notes (Signed)
 Office Visit  Subjective   Patient ID: Elizabeth [name]   DOB: 02/28/1935   Age: 89 y.o.   MRN: [id number]   Chief Complaint Chief Complaint  Patient presents with   Follow-up    2 week follow up     History of Present Illness 89 years old female who is here for follow up with her daughter.  She was here recently for upper respiratory infection and she is stated that she is feeling much better.  She has uncontrolled diabetes mellitus.  She does not check her sugar.  She take metformin  500 twice a day.  She could not tolerate Ozempic  because of nausea and nosebleed.  She has blurry vision and she is due to see yearly eye exam.  Her hemoglobin A1c last year was 10.   She also has hyperlipidemia and she takes atorvastatin  40 mg daily.  She denies any side effect.  Her cholesterol was well-controlled.   She also has hypertension and she takes metoprolol  25 mg twice a day.  Her blood pressure is well-controlled.   She says that she is still depressed and she cries a lot at nighttime. Her husband died 2 years ago. She take escitalopram  20 mg daily.  She does not have any suicidal ideation.  She also has coronary artery disease status post stent placement.  She takes aspirin  81 mg for cardiovascular prevention.  She was seen by cardiologist and per patient everything was okay.    Has systolic congestive heart failure and takes Lasix  as needed but she says that her breathing is better now.  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   Eyes:  Positive for blurred vision.  Respiratory: Negative.    Cardiovascular: Negative.   Gastrointestinal: Negative.   Neurological: Negative.   Psychiatric/Behavioral:  Positive for depression.        Objective:    Vitals BP 124/70   Pulse 69   Temp (!) 97.5 F (36.4 C)   Resp 18   Wt 231 lb 4 oz (104.9 kg)   PF 95 L/min   BMI 43.69 kg/m    Physical Examination Physical Exam Constitutional:      Appearance: She is obese.  Cardiovascular:     Rate and Rhythm: Normal rate and regular rhythm.     Heart sounds: Normal heart sounds.  Pulmonary:     Effort: Pulmonary effort is normal.     Breath sounds: Normal breath sounds.  Abdominal:     General: Bowel sounds are normal.     Palpations: Abdomen is soft.  Neurological:     General: No focal deficit present.     Mental Status: She is alert and oriented to person, place, and time.        Assessment & Plan:   Essential  hypertension Blood pressure is controlled on current medication.  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  Dyslipidemia associated with type 2 diabetes mellitus (HCC) Her cholesterol is controlled but diabetes is uncontrolled.  I will add Januvia  100 mg and she will continue with metformin  twice a day.  If blood sugar is high I may need to add glimepiride .  Moderate episode of recurrent major depressive disorder (HCC) She takes escitalopram  20 mg daily.  I will add Rexulti  0.5 mg daily as adjunct therapy for depression.    Return in about 1 month (around 04/15/2024).   Roetta Dare, MD

## 2024-03-16 ENCOUNTER — Ambulatory Visit: Payer: Self-pay

## 2024-03-16 ENCOUNTER — Other Ambulatory Visit: Payer: Self-pay | Admitting: Internal Medicine

## 2024-03-16 LAB — LIPID PANEL
Chol/HDL Ratio: 2 ratio (ref 0.0–4.4)
Cholesterol, Total: 79 mg/dL — ABNORMAL LOW (ref 100–199)
HDL: 39 mg/dL — ABNORMAL LOW (ref >39)
LDL Chol Calc (NIH): 24 mg/dL (ref 0–99)
Triglycerides: 72 mg/dL (ref 0–149)
VLDL Cholesterol Cal: 16 mg/dL (ref 5–40)

## 2024-03-16 LAB — CMP14 + ANION GAP
ALT: 19 IU/L (ref 0–32)
AST: 16 IU/L (ref 0–40)
Albumin: 3.2 g/dL — ABNORMAL LOW (ref 3.7–4.7)
Alkaline Phosphatase: 59 IU/L (ref 44–121)
Anion Gap: 13 mmol/L (ref 10.0–18.0)
BUN/Creatinine Ratio: 19 (ref 12–28)
BUN: 18 mg/dL (ref 8–27)
Bilirubin Total: 0.5 mg/dL (ref 0.0–1.2)
CO2: 23 mmol/L (ref 20–29)
Calcium: 8.9 mg/dL (ref 8.7–10.3)
Chloride: 104 mmol/L (ref 96–106)
Creatinine, Ser: 0.95 mg/dL (ref 0.57–1.00)
Globulin, Total: 2 g/dL (ref 1.5–4.5)
Glucose: 229 mg/dL — ABNORMAL HIGH (ref 70–99)
Potassium: 4.7 mmol/L (ref 3.5–5.2)
Sodium: 140 mmol/L (ref 134–144)
Total Protein: 5.2 g/dL — ABNORMAL LOW (ref 6.0–8.5)
eGFR: 58 mL/min/1.73 — ABNORMAL LOW (ref >59)

## 2024-03-16 LAB — HEMOGLOBIN A1C

## 2024-03-16 MED ORDER — GLIMEPIRIDE 4 MG PO TABS: 4.0000 mg | ORAL_TABLET | Freq: Every day | ORAL | 6 refills | Status: AC

## 2024-03-24 ENCOUNTER — Other Ambulatory Visit: Payer: Self-pay

## 2024-03-24 MED ORDER — METFORMIN HCL ER 500 MG PO TB24: 500.0000 mg | ORAL_TABLET | Freq: Two times a day (BID) | ORAL | 0 refills | Status: DC

## 2024-04-12 ENCOUNTER — Ambulatory Visit: Admitting: Internal Medicine

## 2024-04-15 ENCOUNTER — Other Ambulatory Visit: Payer: Self-pay | Admitting: Internal Medicine

## 2024-04-15 DIAGNOSIS — F331 Major depressive disorder, recurrent, moderate: Secondary | ICD-10-CM

## 2024-04-15 MED ORDER — ESCITALOPRAM OXALATE 20 MG PO TABS: 20.0000 mg | ORAL_TABLET | Freq: Every day | ORAL | 3 refills | Status: AC

## 2024-05-03 ENCOUNTER — Encounter: Payer: Self-pay | Admitting: Internal Medicine

## 2024-05-03 ENCOUNTER — Ambulatory Visit: Admitting: Internal Medicine

## 2024-05-03 VITALS — BP 124/80 | HR 64 | Temp 97.2°F | Resp 18 | Ht 61.0 in | Wt 232.0 lb

## 2024-05-03 DIAGNOSIS — S81802A Unspecified open wound, left lower leg, initial encounter: Secondary | ICD-10-CM | POA: Insufficient documentation

## 2024-05-03 DIAGNOSIS — E1169 Type 2 diabetes mellitus with other specified complication: Secondary | ICD-10-CM | POA: Diagnosis not present

## 2024-05-03 DIAGNOSIS — E785 Hyperlipidemia, unspecified: Secondary | ICD-10-CM

## 2024-05-03 DIAGNOSIS — F331 Major depressive disorder, recurrent, moderate: Secondary | ICD-10-CM | POA: Diagnosis not present

## 2024-05-03 DIAGNOSIS — R600 Localized edema: Secondary | ICD-10-CM | POA: Insufficient documentation

## 2024-05-03 DIAGNOSIS — I10 Essential (primary) hypertension: Secondary | ICD-10-CM | POA: Diagnosis not present

## 2024-05-03 MED ORDER — ACCU-CHEK FASTCLIX LANCET KIT: PACK | 3 refills | Status: AC

## 2024-05-03 MED ORDER — CEPHALEXIN 500 MG PO CAPS
500.0000 mg | ORAL_CAPSULE | Freq: Four times a day (QID) | ORAL | 0 refills | Status: AC
Start: 2024-05-03 — End: 2024-05-10

## 2024-05-03 MED ORDER — ACCU-CHEK GUIDE ME W/DEVICE KIT: PACK | 0 refills | Status: AC

## 2024-05-03 NOTE — Assessment & Plan Note (Signed)
 I will do venous Doppler to rule or DVT

## 2024-05-03 NOTE — Progress Notes (Signed)
 Office Visit  Subjective   Patient ID: Elizabeth [name]   DOB: 06-09-1935   Age: 89 y.o.   MRN: [id number]   Chief Complaint Chief Complaint  Patient presents with   Follow-up    CAD     History of Present Illness 89 years old female who is here for follow up with her daughter.   She says that she bumped her left leg to a furniture 3 days ago and has a wound in her left leg that is getting worse and there is redness and pain surrounding that wound.  Her left leg is more swollen as compared to right leg.  She has a history of congestive heart failure but denies any shortness of breath.  She lives alone.    She has diabetes mellitus and she does not check her sugar at home.  Her hemoglobin A1c 2 months ago was 10.2.  She says that she does not check her sugar at home.  She take glimepiride , Januvia  100 mg  and metformin .  She says that she has poor vision and she follows with eye doctor for diabetic eye exam. She could not tolerate Ozempic  because of nausea and nosebleed.    She also has hyperlipidemia and she takes atorvastatin  40 mg daily.  She denies any side effect.  Her cholesterol was well-controlled.    She also has hypertension and she takes metoprolol  25 mg twice a day.  Her blood pressure is well-controlled.    She says that she is still depressed.  She could not tolerate Rexulti  because it cost her 1000 dollar per month.  She says that her depression is better.  She takes escitalopram  20 mg daily.  She does not have any suicidal ideation.   She also has coronary artery disease status post stent placement.  She takes aspirin  81 mg for cardiovascular prevention.  She was seen by cardiologist and per patient everything was okay.     Has systolic congestive heart failure and takes Lasix  as needed but she says that her breathing is better now.  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   Eyes:  Positive for blurred vision.  Respiratory: Negative.    Cardiovascular:  Positive for leg swelling.  Musculoskeletal:         Wound left leg with surrounding erythema and left leg swelling.  Neurological: Negative.        Objective:    Vitals BP 124/80 (BP Location: Left Arm)   Pulse 64   Temp (!) 97.2 F (36.2 C)   Resp 18   Ht 5' 1 (1.549 m)   Wt 232 lb (105.2 kg)   SpO2 95%   BMI 43.84 kg/m    Physical Examination Physical Exam Constitutional:      Appearance: Normal appearance. She  is obese.  Cardiovascular:     Rate and Rhythm: Normal rate and regular rhythm.     Heart sounds: Normal heart sounds.  Pulmonary:     Effort: Pulmonary effort is normal.     Breath sounds: Normal breath sounds.  Musculoskeletal:        General: Tenderness present.     Left lower leg: Edema present.  Skin:    Findings: Erythema present.     Comments:   Left shin wound  Neurological:     Mental Status: She is alert.        Assessment & Plan:   Wound of left leg  She will wash and do daily dressing left leg.  I will start on Keflex  in 500 mg every 6 hour for 7 days.  I will do venous Doppler and if that is negative she need to have Ace bandage for [employer].  Leg edema, left   I will do venous Doppler to rule or DVT  Dyslipidemia  Her cholesterol is well controlled.  Dyslipidemia associated with type 2 diabetes mellitus (HCC)   Her blood sugar is uncontrolled.  I have discussed with her that she need to check her  sugar at home and if fasting sugar is greater than 150 she need to call our office.  I will do hemoglobin A1c on next visit.  Essential hypertension   Her blood pressure is controlled.  Moderate episode of recurrent major depressive disorder (HCC)   She tells me that her depression is better so I will stop Rexulti  and continue with escitalopram  20 mg daily.    Return in about 3 months (around 08/02/2024).   Roetta Dare, MD

## 2024-05-03 NOTE — Assessment & Plan Note (Signed)
 Her blood pressure is controlled.

## 2024-05-03 NOTE — Assessment & Plan Note (Signed)
 Her blood sugar is uncontrolled.  I have discussed with her that she need to check her sugar at home and if fasting sugar is greater than 150 she need to call our office.  I will do hemoglobin A1c on next visit.

## 2024-05-03 NOTE — Assessment & Plan Note (Signed)
 She tells me that her depression is better so I will stop Rexulti  and continue with escitalopram  20 mg daily.

## 2024-05-03 NOTE — Assessment & Plan Note (Signed)
 Her cholesterol is well controlled.

## 2024-05-03 NOTE — Assessment & Plan Note (Signed)
 She will wash and do daily dressing left leg.  I will start on Keflex  in 500 mg every 6 hour for 7 days.  I will do venous Doppler and if that is negative she need to have Ace bandage for [employer].

## 2024-05-04 ENCOUNTER — Encounter (HOSPITAL_BASED_OUTPATIENT_CLINIC_OR_DEPARTMENT_OTHER)

## 2024-05-04 DIAGNOSIS — R600 Localized edema: Secondary | ICD-10-CM

## 2024-05-06 ENCOUNTER — Ambulatory Visit: Payer: Self-pay

## 2024-05-19 DIAGNOSIS — Z961 Presence of intraocular lens: Secondary | ICD-10-CM | POA: Diagnosis not present

## 2024-05-19 DIAGNOSIS — E113593 Type 2 diabetes mellitus with proliferative diabetic retinopathy without macular edema, bilateral: Secondary | ICD-10-CM | POA: Diagnosis not present

## 2024-05-19 DIAGNOSIS — H3589 Other specified retinal disorders: Secondary | ICD-10-CM | POA: Diagnosis not present

## 2024-05-19 DIAGNOSIS — H540X33 Blindness right eye category 3, blindness left eye category 3: Secondary | ICD-10-CM | POA: Diagnosis not present

## 2024-05-19 DIAGNOSIS — H3341 Traction detachment of retina, right eye: Secondary | ICD-10-CM | POA: Diagnosis not present

## 2024-05-25 ENCOUNTER — Other Ambulatory Visit: Payer: Self-pay | Admitting: Internal Medicine

## 2024-05-26 ENCOUNTER — Other Ambulatory Visit: Payer: Self-pay

## 2024-06-30 ENCOUNTER — Other Ambulatory Visit: Payer: Self-pay | Admitting: Internal Medicine

## 2024-07-01 ENCOUNTER — Other Ambulatory Visit: Payer: Self-pay | Admitting: Internal Medicine

## 2024-07-09 ENCOUNTER — Other Ambulatory Visit: Payer: Self-pay | Admitting: Internal Medicine

## 2024-08-02 ENCOUNTER — Ambulatory Visit: Admitting: Internal Medicine

## 2024-08-23 ENCOUNTER — Encounter: Payer: Self-pay | Admitting: Internal Medicine

## 2024-08-23 ENCOUNTER — Ambulatory Visit: Admitting: Internal Medicine

## 2024-08-23 VITALS — BP 130/80 | HR 83 | Temp 97.3°F | Resp 18 | Ht 61.0 in | Wt 237.4 lb

## 2024-08-23 DIAGNOSIS — I10 Essential (primary) hypertension: Secondary | ICD-10-CM | POA: Diagnosis not present

## 2024-08-23 DIAGNOSIS — E1169 Type 2 diabetes mellitus with other specified complication: Secondary | ICD-10-CM | POA: Diagnosis not present

## 2024-08-23 DIAGNOSIS — E785 Hyperlipidemia, unspecified: Secondary | ICD-10-CM

## 2024-08-23 DIAGNOSIS — I25118 Atherosclerotic heart disease of native coronary artery with other forms of angina pectoris: Secondary | ICD-10-CM | POA: Diagnosis not present

## 2024-08-23 DIAGNOSIS — F331 Major depressive disorder, recurrent, moderate: Secondary | ICD-10-CM

## 2024-08-23 NOTE — Assessment & Plan Note (Signed)
 Her blood pressure is well controlled.

## 2024-08-23 NOTE — Assessment & Plan Note (Signed)
Her depression is better

## 2024-08-23 NOTE — Progress Notes (Signed)
 "  Office Visit  Subjective   Patient ID: Elizabeth [name]   DOB: 1934-09-05   Age: 89 y.o.   MRN: [id number]   Chief Complaint Chief Complaint  Patient presents with   Follow-up    3 month follow up      History of Present Illness 89 years old female who is here for follow up with her daughter. She is complaining of scab in her left lower leg with slight redness around that.  She says that she bumped this area.  She has a swelling of her left leg that is old problem. She has a history of congestive heart failure but denies any shortness of breath. She lives alone.  She takes metoprolol  25 mg twice a day and Lasix  40 mg as needed.  She has not seen cardiologist.     She has diabetes mellitus and she does not check her sugar at home.  Her hemoglobin A1c was 10.2  and I have it adjusted medication for her.  She takes glimepiride  4 mg daily, Januvia  100 mg and metformin  500 mg twice a day.  She regularly sees eye doctor because of bleeding and poor vision. She could not tolerate Ozempic  because of nausea and nosebleed.    She also has hyperlipidemia and she takes atorvastatin  40 mg daily.  She denies any side effect.  Her cholesterol was well-controlled.    She also has hypertension and she takes metoprolol  25 mg twice a day.  Her blood pressure is well-controlled.    She says that she is still depressed.  She could not tolerate Rexulti  because it cost her 1000 dollar per month.  She says that her depression is better.  She takes escitalopram  20 mg daily.  She does not have any suicidal ideation.   She also has coronary artery disease status post stent placement.  She takes aspirin  81 mg for cardiovascular prevention.    As she has no pain so she did not see cardiologist.     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   HENT: Negative.    Respiratory: Negative.    Cardiovascular: Negative.   Gastrointestinal: Negative.   Neurological: Negative.        Objective:    Vitals BP 130/80   Pulse 83   Temp (!) 97.3 F (36.3 C)   Resp 18   Ht 5' 1 (1.549 m)   Wt 237 lb 6 oz (107.7 kg)   SpO2 94%   BMI 44.85 kg/m    Physical Examination Physical Exam Constitutional:      Appearance: Normal appearance.  Cardiovascular:     Rate and Rhythm: Normal rate and regular rhythm.     Heart sounds: Normal heart sounds.  Pulmonary:     Effort: Pulmonary effort is normal.     Breath sounds: Normal breath sounds.  Abdominal:     General:  Bowel sounds are normal.     Palpations: Abdomen is soft.  Neurological:     General: No focal deficit present.     Mental Status: She is alert and oriented to person, place, and time.        Assessment & Plan:   Essential hypertension   Her blood pressure is well controlled.  Coronary artery disease of native heart with stable angina pectoris   Stable.  She says that she has not use nitroglycerin .  Dyslipidemia associated with type 2 diabetes mellitus (HCC)   Her diabetes was not controlled on last blood draw but her LDL was target controlled.  I will repeat CMP and hemoglobin A1c.  Moderate episode of recurrent major depressive disorder (HCC)   Her depression is better.    I have suggested to use Ace bandage to left leg for swelling during daytime.    Return in about 3 months (around 11/21/2024).   Larrie Lucia, MD      [1]  Allergies Allergen Reactions   Sulfa Antibiotics Hives   Cyclobenzaprine  Hcl Rash   "

## 2024-08-23 NOTE — Assessment & Plan Note (Signed)
"    Her diabetes was not controlled on last blood draw but her LDL was target controlled.  I will repeat CMP and hemoglobin A1c. "

## 2024-08-23 NOTE — Assessment & Plan Note (Signed)
"    Stable.  She says that she has not use nitroglycerin . "

## 2024-08-24 LAB — CMP14 + ANION GAP
ALT: 11 IU/L (ref 0–32)
AST: 14 IU/L (ref 0–40)
Albumin: 3.5 g/dL — ABNORMAL LOW (ref 3.7–4.7)
Alkaline Phosphatase: 82 IU/L (ref 48–129)
Anion Gap: 12 mmol/L (ref 10.0–18.0)
BUN/Creatinine Ratio: 14 (ref 12–28)
BUN: 13 mg/dL (ref 8–27)
Bilirubin Total: 0.5 mg/dL (ref 0.0–1.2)
CO2: 26 mmol/L (ref 20–29)
Calcium: 8.8 mg/dL (ref 8.7–10.3)
Chloride: 104 mmol/L (ref 96–106)
Creatinine, Ser: 0.94 mg/dL (ref 0.57–1.00)
Globulin, Total: 2.1 g/dL (ref 1.5–4.5)
Glucose: 272 mg/dL — ABNORMAL HIGH (ref 70–99)
Potassium: 5.1 mmol/L (ref 3.5–5.2)
Sodium: 142 mmol/L (ref 134–144)
Total Protein: 5.6 g/dL — ABNORMAL LOW (ref 6.0–8.5)
eGFR: 59 mL/min/1.73 — ABNORMAL LOW

## 2024-08-24 LAB — HEMOGLOBIN A1C
Est. average glucose Bld gHb Est-mCnc: 249 mg/dL
Hgb A1c MFr Bld: 10.3 % — ABNORMAL HIGH (ref 4.8–5.6)

## 2024-09-06 ENCOUNTER — Ambulatory Visit: Payer: Self-pay

## 2024-09-06 NOTE — Progress Notes (Signed)
 Patient called.  Patient aware. Spoke with patients daughter according to her her mom have no activity now and she is taking her all medication   Dr caleen : His sugar is very high and pleas ask the daughtr what we can do for her.

## 2024-11-22 ENCOUNTER — Ambulatory Visit: Admitting: Internal Medicine
